# Patient Record
Sex: Male | Born: 1970 | Race: Black or African American | Hispanic: No | Marital: Single | Smoking: Current some day smoker
Health system: Southern US, Community
[De-identification: ages and names within clinical notes are randomized; demographics above are authoritative.]

## PROBLEM LIST (undated history)

## (undated) DIAGNOSIS — F32A Depression, unspecified: Secondary | ICD-10-CM

## (undated) DIAGNOSIS — I10 Essential (primary) hypertension: Secondary | ICD-10-CM

## (undated) DIAGNOSIS — F329 Major depressive disorder, single episode, unspecified: Secondary | ICD-10-CM

## (undated) DIAGNOSIS — N200 Calculus of kidney: Secondary | ICD-10-CM

## (undated) DIAGNOSIS — R51 Headache: Secondary | ICD-10-CM

## (undated) DIAGNOSIS — K219 Gastro-esophageal reflux disease without esophagitis: Secondary | ICD-10-CM

## (undated) DIAGNOSIS — K2990 Gastroduodenitis, unspecified, without bleeding: Secondary | ICD-10-CM

## (undated) DIAGNOSIS — R519 Headache, unspecified: Secondary | ICD-10-CM

## (undated) DIAGNOSIS — K5792 Diverticulitis of intestine, part unspecified, without perforation or abscess without bleeding: Secondary | ICD-10-CM

## (undated) DIAGNOSIS — K921 Melena: Secondary | ICD-10-CM

## (undated) DIAGNOSIS — F419 Anxiety disorder, unspecified: Secondary | ICD-10-CM

## (undated) HISTORY — DX: Diverticulitis of intestine, part unspecified, without perforation or abscess without bleeding: K57.92

## (undated) HISTORY — DX: Headache: R51

## (undated) HISTORY — DX: Anxiety disorder, unspecified: F41.9

## (undated) HISTORY — DX: Headache, unspecified: R51.9

## (undated) HISTORY — DX: Gastroduodenitis, unspecified, without bleeding: K29.90

## (undated) HISTORY — PX: TONSILLECTOMY: SUR1361

## (undated) HISTORY — DX: Essential (primary) hypertension: I10

---

## 1998-11-17 ENCOUNTER — Emergency Department (HOSPITAL_COMMUNITY): Admission: EM | Admit: 1998-11-17 | Discharge: 1998-11-17 | Payer: Self-pay | Admitting: Emergency Medicine

## 2001-04-22 ENCOUNTER — Emergency Department (HOSPITAL_COMMUNITY): Admission: EM | Admit: 2001-04-22 | Discharge: 2001-04-22 | Payer: Self-pay

## 2004-08-09 ENCOUNTER — Emergency Department (HOSPITAL_COMMUNITY): Admission: EM | Admit: 2004-08-09 | Discharge: 2004-08-09 | Payer: Self-pay | Admitting: Family Medicine

## 2005-02-28 ENCOUNTER — Emergency Department (HOSPITAL_COMMUNITY): Admission: EM | Admit: 2005-02-28 | Discharge: 2005-02-28 | Payer: Self-pay | Admitting: Emergency Medicine

## 2008-11-12 ENCOUNTER — Emergency Department (HOSPITAL_COMMUNITY): Admission: EM | Admit: 2008-11-12 | Discharge: 2008-11-12 | Payer: Self-pay | Admitting: Emergency Medicine

## 2009-03-12 ENCOUNTER — Emergency Department (HOSPITAL_COMMUNITY): Admission: EM | Admit: 2009-03-12 | Discharge: 2009-03-12 | Payer: Self-pay | Admitting: Emergency Medicine

## 2010-10-24 ENCOUNTER — Emergency Department (HOSPITAL_COMMUNITY)
Admission: EM | Admit: 2010-10-24 | Discharge: 2010-10-25 | Disposition: A | Discharge status: Home / Self Care | Payer: Self-pay | Attending: Emergency Medicine | Admitting: Emergency Medicine

## 2010-10-24 DIAGNOSIS — K5289 Other specified noninfective gastroenteritis and colitis: Secondary | ICD-10-CM | POA: Insufficient documentation

## 2010-10-24 DIAGNOSIS — R1033 Periumbilical pain: Secondary | ICD-10-CM | POA: Insufficient documentation

## 2010-10-24 LAB — COMPREHENSIVE METABOLIC PANEL
CO2: 29 mEq/L (ref 19–32)
Calcium: 10 mg/dL (ref 8.4–10.5)
Chloride: 104 mEq/L (ref 96–112)
GFR calc Af Amer: >60 mL/min (ref >60)
Glucose, Bld: 111 mg/dL — ABNORMAL HIGH (ref 70–99)
Potassium: 3.7 mEq/L (ref 3.5–5.1)
Sodium: 140 mEq/L (ref 135–145)
Total Bilirubin: 0.2 mg/dL — ABNORMAL LOW (ref 0.3–1.2)
Total Protein: 7.1 g/dL (ref 6.0–8.3)

## 2010-10-24 LAB — URINALYSIS, ROUTINE W REFLEX MICROSCOPIC
Glucose, UA: NEGATIVE mg/dL
Hgb urine dipstick: NEGATIVE
Ketones, ur: NEGATIVE mg/dL
Leukocytes, UA: NEGATIVE
Specific Gravity, Urine: 1.019 (ref 1.005–1.030)
Urobilinogen, UA: 0.2 mg/dL (ref 0.0–1.0)
pH: 7.5 (ref 5.0–8.0)

## 2010-10-24 LAB — DIFFERENTIAL
Eosinophils Relative: 11 % — ABNORMAL HIGH (ref 0–5)
Lymphocytes Relative: 34 % (ref 12–46)
Neutro Abs: 4.5 10*3/uL (ref 1.7–7.7)
Neutrophils Relative %: 48 % (ref 43–77)

## 2010-10-24 LAB — CBC
MCHC: 35.1 g/dL (ref 30.0–36.0)
RDW: 12.7 % (ref 11.5–15.5)

## 2010-10-25 ENCOUNTER — Emergency Department (HOSPITAL_COMMUNITY): Payer: Self-pay

## 2010-10-25 LAB — LIPASE, BLOOD: Lipase: 32 U/L (ref 11–59)

## 2010-10-25 MED ORDER — IOHEXOL 300 MG/ML  SOLN
100.0000 mL | Freq: Once | INTRAMUSCULAR | Status: AC | PRN
  Administered 2010-10-25: 100 mL via INTRAVENOUS

## 2012-06-21 ENCOUNTER — Emergency Department (HOSPITAL_COMMUNITY)
Admission: EM | Admit: 2012-06-21 | Discharge: 2012-06-21 | Disposition: A | Discharge status: Home / Self Care | Payer: Self-pay | Attending: Emergency Medicine | Admitting: Emergency Medicine

## 2012-06-21 ENCOUNTER — Emergency Department (HOSPITAL_COMMUNITY): Payer: Self-pay

## 2012-06-21 ENCOUNTER — Encounter (HOSPITAL_COMMUNITY): Payer: Self-pay | Admitting: Emergency Medicine

## 2012-06-21 DIAGNOSIS — Z79899 Other long term (current) drug therapy: Secondary | ICD-10-CM | POA: Insufficient documentation

## 2012-06-21 DIAGNOSIS — Y9367 Activity, basketball: Secondary | ICD-10-CM | POA: Insufficient documentation

## 2012-06-21 DIAGNOSIS — M25562 Pain in left knee: Secondary | ICD-10-CM

## 2012-06-21 DIAGNOSIS — F17200 Nicotine dependence, unspecified, uncomplicated: Secondary | ICD-10-CM | POA: Insufficient documentation

## 2012-06-21 DIAGNOSIS — S99929A Unspecified injury of unspecified foot, initial encounter: Secondary | ICD-10-CM | POA: Insufficient documentation

## 2012-06-21 DIAGNOSIS — Y9239 Other specified sports and athletic area as the place of occurrence of the external cause: Secondary | ICD-10-CM | POA: Insufficient documentation

## 2012-06-21 DIAGNOSIS — Y92838 Other recreation area as the place of occurrence of the external cause: Secondary | ICD-10-CM | POA: Insufficient documentation

## 2012-06-21 DIAGNOSIS — X58XXXA Exposure to other specified factors, initial encounter: Secondary | ICD-10-CM | POA: Insufficient documentation

## 2012-06-21 DIAGNOSIS — S8990XA Unspecified injury of unspecified lower leg, initial encounter: Secondary | ICD-10-CM | POA: Insufficient documentation

## 2012-06-21 MED ORDER — MELOXICAM 15 MG PO TABS: 15.0000 mg | ORAL_TABLET | Freq: Every day | ORAL | Status: DC

## 2012-06-21 NOTE — ED Provider Notes (Signed)
Medical screening examination/treatment/procedure(s) were performed by non-physician practitioner and as supervising physician I was immediately available for consultation/collaboration.   Makynzie Dobesh L Anaalicia Reimann, MD 06/21/12 1925 

## 2012-06-21 NOTE — ED Notes (Signed)
Pt was playing basketball yesterday. Had sudden left knee pain. Pain worsened over next several hours. Pt has exaggerated bone prominence on both knees but left slightly larger.

## 2012-06-21 NOTE — ED Provider Notes (Signed)
History     CSN: [id number]  Arrival date & time 06/21/12  1111   First MD Initiated Contact with Patient 06/21/12 1116      No chief complaint on file.   (Consider location/radiation/quality/duration/timing/severity/associated sxs/prior treatment) HPI  42 year old male presents to emergency department chief complaint of left knee pain.  Patient states he was playing basketball yesterday.  He states that this is the out of the ordinary for his regular physical activity.  Patient states he did not remember injuring the knee at any time.  Throughout the evening as he was grilling out he noticed increasing pain in the left anterior knee.  Patient states he has a history of knee problems and states he was told he had "soft bones."  Since he was a kid.  The patient noticed swelling to the anterior knee at the site of the tibial tuberosity.  He has difficulty with full extension and flexion of the knee.  Patient states he feels some instability in the knee when walking.  He denies any numbness or tingling of the feet.  Pain is worse with movement and walking. He has not tried any medications for relief.  History reviewed. No pertinent past medical history.  Past Surgical History  Procedure Laterality Date  . Tonsillectomy      No family history on file.  History  Substance Use Topics  . Smoking status: Current Every Day Smoker  . Smokeless tobacco: Not on file  . Alcohol Use: Yes      Review of Systems Ten systems reviewed and are negative for acute change, except as noted in the HPI.   Allergies  Review of patient's allergies indicates no known allergies.  Home Medications   Current Outpatient Rx  Name  Route  Sig  Dispense  Refill  . Aspirin-Acetaminophen-Caffeine (GOODY HEADACHE PO)   Oral   Take 1 packet by mouth once.           BP 130/62  Pulse 84  Temp(Src) 98 F (36.7 C) (Oral)  SpO2 97%  Physical Exam Physical Exam  Nursing note and vitals  reviewed. Constitutional: He appears well-developed and well-nourished. No distress.  HENT:  Head: Normocephalic and atraumatic.  Eyes: Conjunctivae normal are normal. No scleral icterus.  Neck: Normal range of motion. Neck supple.  Cardiovascular: Normal rate, regular rhythm and normal heart sounds.   Pulmonary/Chest: Effort normal and breath sounds normal. No respiratory distress.  Abdominal: Soft. There is no tenderness.  Musculoskeletal: He exhibits no edema.  Neurological: He is alert.  Skin: Skin is warm and dry. He is not diaphoretic.  Psychiatric: His behavior is normal.  Knee exam: the injured knee reveals soft tissue tenderness over medial joint line, mild diffuse swelling, reduced range of motion, tenderness over tibial tubercle. X-ray is negative for fracture.   ED Course  Procedures (including critical care time)  Labs Reviewed - No data to display Dg Knee Complete 4 Views Left  06/21/2012  *RADIOLOGY REPORT*  Clinical Data: Basketball injury.  Knee pain.  LEFT KNEE - COMPLETE 4+ VIEW  Comparison: None.  Findings: There is no acute bony or joint abnormality.  No joint effusion is identified.  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  IMPRESSION: No acute finding.   Original Report Authenticated By: Holley Dexter, M.D.      1. Knee pain, acute, left       MDM  12:19 PM Filed Vitals:   06/21/12 1120  BP: 130/62  Pulse: 84  Temp: 98 F (  36.7 C)   It is very tender to palpation over the tibial tubercle.  He has some swelling in the knee.  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  And discharging the patient with NSAIDs, knee brace and crutches.  The patient should follow up with an orthopedic doctor.        Arthor Captain, PA-C 06/21/12 1913

## 2013-03-31 ENCOUNTER — Emergency Department (HOSPITAL_COMMUNITY)
Admission: EM | Admit: 2013-03-31 | Discharge: 2013-04-01 | Disposition: A | Discharge status: Home / Self Care | Payer: Self-pay | Attending: Emergency Medicine | Admitting: Emergency Medicine

## 2013-03-31 ENCOUNTER — Encounter (HOSPITAL_COMMUNITY): Payer: Self-pay | Admitting: Emergency Medicine

## 2013-03-31 DIAGNOSIS — H5710 Ocular pain, unspecified eye: Secondary | ICD-10-CM | POA: Insufficient documentation

## 2013-03-31 DIAGNOSIS — R51 Headache: Secondary | ICD-10-CM | POA: Insufficient documentation

## 2013-03-31 DIAGNOSIS — R42 Dizziness and giddiness: Secondary | ICD-10-CM | POA: Insufficient documentation

## 2013-03-31 DIAGNOSIS — F17200 Nicotine dependence, unspecified, uncomplicated: Secondary | ICD-10-CM | POA: Insufficient documentation

## 2013-03-31 DIAGNOSIS — R519 Headache, unspecified: Secondary | ICD-10-CM

## 2013-03-31 NOTE — ED Notes (Signed)
Pt reports he has been "getting over a cold" and has had a HA above his L eyebrow for the past 5 days, reports it is worse than any HA he has had before, Pt states he has taken Tylenol and BC powders with relief, "but it keeps coming back" Pt a&o x4, ambulatory to triage.

## 2013-04-01 ENCOUNTER — Emergency Department (HOSPITAL_COMMUNITY): Payer: Self-pay

## 2013-04-01 LAB — CBC WITH DIFFERENTIAL/PLATELET
BASOS ABS: 0 10*3/uL (ref 0.0–0.1)
BASOS PCT: 0 % (ref 0–1)
EOS ABS: 0.2 10*3/uL (ref 0.0–0.7)
Eosinophils Relative: 2 % (ref 0–5)
HEMATOCRIT: 41.9 % (ref 39.0–52.0)
HEMOGLOBIN: 14.5 g/dL (ref 13.0–17.0)
Lymphocytes Relative: 39 % (ref 12–46)
Lymphs Abs: 3.8 10*3/uL (ref 0.7–4.0)
MCH: 28.9 pg (ref 26.0–34.0)
MCHC: 34.6 g/dL (ref 30.0–36.0)
MCV: 83.5 fL (ref 78.0–100.0)
MONOS PCT: 8 % (ref 3–12)
Monocytes Absolute: 0.8 10*3/uL (ref 0.1–1.0)
Neutro Abs: 5 10*3/uL (ref 1.7–7.7)
Neutrophils Relative %: 51 % (ref 43–77)
PLATELETS: 276 10*3/uL (ref 150–400)
RBC: 5.02 MIL/uL (ref 4.22–5.81)
RDW: 13.3 % (ref 11.5–15.5)
WBC: 9.8 10*3/uL (ref 4.0–10.5)

## 2013-04-01 LAB — POCT I-STAT, CHEM 8
BUN: 15 mg/dL (ref 6–23)
CHLORIDE: 105 meq/L (ref 96–112)
CREATININE: 1.4 mg/dL — AB (ref 0.50–1.35)
Calcium, Ion: 1.21 mmol/L (ref 1.12–1.23)
GLUCOSE: 88 mg/dL (ref 70–99)
HCT: 46 % (ref 39.0–52.0)
Hemoglobin: 15.6 g/dL (ref 13.0–17.0)
POTASSIUM: 3.7 meq/L (ref 3.7–5.3)
SODIUM: 142 meq/L (ref 137–147)
TCO2: 26 mmol/L (ref 0–100)

## 2013-04-01 LAB — PROTIME-INR
INR: 0.82 (ref 0.00–1.49)
PROTHROMBIN TIME: 11.2 s — AB (ref 11.6–15.2)

## 2013-04-01 MED ORDER — HYDROMORPHONE HCL PF 1 MG/ML IJ SOLN
1.0000 mg | Freq: Once | INTRAMUSCULAR | Status: AC
  Administered 2013-04-01: 1 mg via INTRAVENOUS
  Filled 2013-04-01: qty 1

## 2013-04-01 MED ORDER — ONDANSETRON 4 MG PO TBDP: 4.0000 mg | ORAL_TABLET | Freq: Three times a day (TID) | ORAL | Status: DC | PRN

## 2013-04-01 MED ORDER — IBUPROFEN 600 MG PO TABS: 600.0000 mg | ORAL_TABLET | Freq: Four times a day (QID) | ORAL | Status: DC | PRN

## 2013-04-01 MED ORDER — ONDANSETRON HCL 4 MG/2ML IJ SOLN
4.0000 mg | Freq: Once | INTRAMUSCULAR | Status: AC
  Administered 2013-04-01: 4 mg via INTRAVENOUS
  Filled 2013-04-01: qty 2

## 2013-04-01 NOTE — ED Provider Notes (Signed)
CSN: [id number]     Arrival date & time 03/31/13  2212 History   First MD Initiated Contact with Patient 04/01/13 0211     Chief Complaint  Patient presents with  . Headache   (Consider location/radiation/quality/duration/timing/severity/associated sxs/prior Treatment) HPI Comments: Patient states, 5 days ago.  During intercourse, he developed a sharp, stabbing, sudden.  Pain over his or her left eyebrow.  That has been persistent, since, with vague visual changes.  He tried taking Tylenol intermittently for the past 5 days with minimal relief of his pain.  Reports nausea, but no vomiting  Patient is a 43 y.o. male presenting with headaches. The history is provided by the patient.  Headache Pain location:  Frontal Quality:  Sharp Radiates to:  Does not radiate Severity currently:  7/10 Severity at highest:  10/10 Onset quality:  Sudden Duration:  5 days Timing:  Constant Progression:  Unchanged Chronicity:  New Similar to prior headaches: no   Context: intercourse   Relieved by:  Nothing Worsened by:  Activity Ineffective treatments:  Acetaminophen Associated symptoms: dizziness and eye pain   Associated symptoms: no fever, no neck pain, no numbness, no paresthesias, no photophobia and no sinus pressure     History reviewed. No pertinent past medical history. Past Surgical History  Procedure Laterality Date  . Tonsillectomy     History reviewed. No pertinent family history. History  Substance Use Topics  . Smoking status: Current Every Day Smoker  . Smokeless tobacco: Not on file  . Alcohol Use: Yes    Review of Systems  Constitutional: Negative for fever.  HENT: Negative for facial swelling and sinus pressure.   Eyes: Positive for pain. Negative for photophobia.  Musculoskeletal: Negative for neck pain.  Skin: Negative for rash.  Neurological: Positive for dizziness and headaches. Negative for weakness, numbness and paresthesias.    Allergies  Review of  patient's allergies indicates no known allergies.  Home Medications   Current Outpatient Rx  Name  Route  Sig  Dispense  Refill  . ibuprofen (ADVIL,MOTRIN) 600 MG tablet   Oral   Take 1 tablet (600 mg total) by mouth every 6 (six) hours as needed.   30 tablet   0   . ondansetron (ZOFRAN ODT) 4 MG disintegrating tablet   Oral   Take 1 tablet (4 mg total) by mouth every 8 (eight) hours as needed for nausea or vomiting.   20 tablet   0    BP 123/74  Pulse 72  Temp(Src) 98.4 F (36.9 C) (Oral)  Resp 18  Ht 5\' 10"  (1.778 m)  Wt 217 lb (98.431 kg)  BMI 31.14 kg/m2  SpO2 98% Physical Exam  Nursing note and vitals reviewed. Constitutional: He is oriented to person, place, and time. He appears well-developed and well-nourished.  HENT:  Head: Normocephalic.  Left Ear: External ear normal.  Nose: Right sinus exhibits no maxillary sinus tenderness and no frontal sinus tenderness. Left sinus exhibits no maxillary sinus tenderness and no frontal sinus tenderness.  Mouth/Throat: Oropharynx is clear and moist.  Eyes: EOM are normal. Pupils are equal, round, and reactive to light. Right conjunctiva is not injected. Right conjunctiva has no hemorrhage. Left conjunctiva is not injected. Left conjunctiva has no hemorrhage.  Neck: Normal range of motion.  Cardiovascular: Normal rate and regular rhythm.   Pulmonary/Chest: Effort normal.  Musculoskeletal: Normal range of motion.  Neurological: He is alert and oriented to person, place, and time. No cranial nerve deficit.  Skin: Skin is  warm. No rash noted. No erythema.    ED Course  Procedures (including critical care time) Labs Review Labs Reviewed  PROTIME-INR - Abnormal; Notable for the following:    Prothrombin Time 11.2 (*)    All other components within normal limits  POCT I-STAT, CHEM 8 - Abnormal; Notable for the following:    Creatinine, Ser 1.40 (*)    All other components within normal limits  CBC WITH DIFFERENTIAL    Imaging Review Ct Head Wo Contrast  04/01/2013   CLINICAL DATA:  Left-sided headache  EXAM: CT HEAD WITHOUT CONTRAST  TECHNIQUE: Contiguous axial images were obtained from the base of the skull through the vertex without contrast.  COMPARISON:  None  FINDINGS: Normal appearance of the intracranial structures. No evidence for acute hemorrhage, mass lesion, midline shift, hydrocephalus or large infarct. No acute bony abnormality. The visualized sinuses are clear.  IMPRESSION: No acute intracranial abnormality.   Electronically Signed   By: Daryll Brod M.D.   On: 04/01/2013 02:47    EKG Interpretation   None       MDM   1. Headache     CT scan reviewed.  There is no acute bleed.  Patient was reevaluated after receiving medication, Zofran, and Dilaudid, he no longer has a headache    Garald Balding, NP 04/01/13 0343  Garald Balding, NP 04/01/13 [phone]

## 2013-04-01 NOTE — Discharge Instructions (Signed)
Today your CT Scan is normal Your headache resolved after IV medication and antiemetic  You have been give a prescription for pain control and nausea

## 2013-04-01 NOTE — ED Provider Notes (Signed)
Medical screening examination/treatment/procedure(s) were performed by non-physician practitioner and as supervising physician I was immediately available for consultation/collaboration.  EKG Interpretation   None         Osvaldo Shipper, MD 04/01/13 [phone]

## 2013-04-13 ENCOUNTER — Emergency Department (HOSPITAL_COMMUNITY)
Admission: EM | Admit: 2013-04-13 | Discharge: 2013-04-13 | Disposition: A | Discharge status: Home / Self Care | Payer: Self-pay

## 2013-05-05 ENCOUNTER — Encounter (HOSPITAL_COMMUNITY): Payer: Self-pay | Admitting: Emergency Medicine

## 2013-05-05 ENCOUNTER — Encounter: Payer: Self-pay | Admitting: Gastroenterology

## 2013-05-05 ENCOUNTER — Emergency Department (HOSPITAL_COMMUNITY): Payer: 59

## 2013-05-05 ENCOUNTER — Emergency Department (HOSPITAL_COMMUNITY)
Admission: EM | Admit: 2013-05-05 | Discharge: 2013-05-05 | Disposition: A | Discharge status: Home / Self Care | Payer: 59 | Attending: Emergency Medicine | Admitting: Emergency Medicine

## 2013-05-05 DIAGNOSIS — R109 Unspecified abdominal pain: Secondary | ICD-10-CM | POA: Insufficient documentation

## 2013-05-05 DIAGNOSIS — K922 Gastrointestinal hemorrhage, unspecified: Secondary | ICD-10-CM | POA: Insufficient documentation

## 2013-05-05 DIAGNOSIS — F17200 Nicotine dependence, unspecified, uncomplicated: Secondary | ICD-10-CM | POA: Insufficient documentation

## 2013-05-05 LAB — COMPREHENSIVE METABOLIC PANEL
ALK PHOS: 69 U/L (ref 39–117)
ALT: 19 U/L (ref 0–53)
AST: 22 U/L (ref 0–37)
Albumin: 3.5 g/dL (ref 3.5–5.2)
BUN: 20 mg/dL (ref 6–23)
CHLORIDE: 101 meq/L (ref 96–112)
CO2: 24 mEq/L (ref 19–32)
Calcium: 9.2 mg/dL (ref 8.4–10.5)
Creatinine, Ser: 1.31 mg/dL (ref 0.50–1.35)
GFR calc Af Amer: 77 mL/min — ABNORMAL LOW (ref >90)
GFR, EST NON AFRICAN AMERICAN: 66 mL/min — AB (ref >90)
Glucose, Bld: 134 mg/dL — ABNORMAL HIGH (ref 70–99)
POTASSIUM: 4 meq/L (ref 3.7–5.3)
Sodium: 138 mEq/L (ref 137–147)
TOTAL PROTEIN: 6.9 g/dL (ref 6.0–8.3)
Total Bilirubin: 0.3 mg/dL (ref 0.3–1.2)

## 2013-05-05 LAB — CBC
HEMATOCRIT: 37.3 % — AB (ref 39.0–52.0)
HEMOGLOBIN: 12.8 g/dL — AB (ref 13.0–17.0)
MCH: 29.2 pg (ref 26.0–34.0)
MCHC: 34.3 g/dL (ref 30.0–36.0)
MCV: 85 fL (ref 78.0–100.0)
PLATELETS: 246 10*3/uL (ref 150–400)
RBC: 4.39 MIL/uL (ref 4.22–5.81)
RDW: 14 % (ref 11.5–15.5)
WBC: 10.1 10*3/uL (ref 4.0–10.5)

## 2013-05-05 LAB — POC OCCULT BLOOD, ED: FECAL OCCULT BLD: POSITIVE — AB

## 2013-05-05 MED ORDER — TRAMADOL HCL 50 MG PO TABS: 50.0000 mg | ORAL_TABLET | Freq: Four times a day (QID) | ORAL | Status: DC | PRN

## 2013-05-05 MED ORDER — SODIUM CHLORIDE 0.9 % IV BOLUS (SEPSIS)
1000.0000 mL | Freq: Once | INTRAVENOUS | Status: AC
  Administered 2013-05-05: 1000 mL via INTRAVENOUS

## 2013-05-05 MED ORDER — MORPHINE SULFATE 4 MG/ML IJ SOLN
4.0000 mg | Freq: Once | INTRAMUSCULAR | Status: AC
  Administered 2013-05-05: 4 mg via INTRAVENOUS
  Filled 2013-05-05: qty 1

## 2013-05-05 MED ORDER — ONDANSETRON HCL 4 MG/2ML IJ SOLN
4.0000 mg | Freq: Once | INTRAMUSCULAR | Status: AC
  Administered 2013-05-05: 4 mg via INTRAVENOUS
  Filled 2013-05-05: qty 2

## 2013-05-05 MED ORDER — PANTOPRAZOLE SODIUM 40 MG PO TBEC
40.0000 mg | DELAYED_RELEASE_TABLET | Freq: Once | ORAL | Status: AC
  Administered 2013-05-05: 40 mg via ORAL
  Filled 2013-05-05: qty 1

## 2013-05-05 MED ORDER — IOHEXOL 300 MG/ML  SOLN
100.0000 mL | Freq: Once | INTRAMUSCULAR | Status: AC | PRN
  Administered 2013-05-05: 100 mL via INTRAVENOUS

## 2013-05-05 MED ORDER — PANTOPRAZOLE SODIUM 40 MG PO TBEC: 40.0000 mg | DELAYED_RELEASE_TABLET | Freq: Every day | ORAL | Status: DC

## 2013-05-05 MED ORDER — IOHEXOL 300 MG/ML  SOLN
20.0000 mL | Freq: Once | INTRAMUSCULAR | Status: AC | PRN
  Administered 2013-05-05: 20 mL via ORAL

## 2013-05-05 NOTE — ED Notes (Signed)
The pt is c/o rectal bleeding he noticed just noticed today.  The bleeding was dark he denies having hemorrhoids.  No pain in his rectum.  No previous history

## 2013-05-05 NOTE — ED Notes (Signed)
Patient was asked to stay 20 more minutes due to medication administration- patient refused.

## 2013-05-05 NOTE — ED Notes (Signed)
This RN walked in with discharge paper work and patient stated that his pain was 10/10. Dr. Lita Mains made aware and new orders placed.

## 2013-05-05 NOTE — Discharge Instructions (Signed)
Call and make an appointment to followup with gastroenterologist this week. Avoid all NSAIDs such as ibuprofen and naproxen. Return immediately to the emergency department for worsening bleeding, lightheadedness, increased abdominal pain or any concerns.  Bloody Stools Bloody stools often mean that there is a problem in the digestive tract. Your caregiver may use the term "melena" to describe black, tarry, and bad smelling stools or "hematochezia" to describe red or maroon-colored stools. Blood seen in the stool can be caused by bleeding anywhere along the intestinal tract.  A black stool usually means that blood is coming from the upper part of the gastrointestinal tract (esophagus, stomach, or small bowel). Passing maroon-colored stools or bright red blood usually means that blood is coming from lower down in the large bowel or the rectum. However, sometimes massive bleeding in the stomach or small intestine can cause bright red bloody stools.  Consuming black licorice, lead, iron pills, medicines containing bismuth subsalicylate, or blueberries can also cause black stools. Your caregiver can test black stools to see if blood is present. It is important that the cause of the bleeding be found. Treatment can then be started, and the problem can be corrected. Rectal bleeding may not be serious, but you should not assume everything is okay until you know the cause.It is very important to follow up with your caregiver or a specialist in gastrointestinal problems. CAUSES  Blood in the stools can come from various underlying causes.Often, the cause is not found during your first visit. Testing is often needed to discover the cause of bleeding in the gastrointestinal tract. Causes range from simple to serious or even life-threatening.Possible causes include:  Hemorrhoids.These are veins that are full of blood (engorged) in the rectum. They cause pain, inflammation, and may bleed.  Anal fissures.These are  areas of painful tearing which may bleed. They are often caused by passing hard stool.  Diverticulosis.These are pouches that form on the colon over time, with age, and may bleed significantly.  Diverticulitis.This is inflammation in areas with diverticulosis. It can cause pain, fever, and bloody stools, although bleeding is rare.  Proctitis and colitis. These are inflamed areas of the rectum or colon. They may cause pain, fever, and bloody stools.  Polyps and cancer. Colon cancer is a leading cause of preventable cancer death.It often starts out as precancerous polyps that can be removed during a colonoscopy, preventing progression into cancer. Sometimes, polyps and cancer may cause rectal bleeding.  Gastritis and ulcers.Bleeding from the upper gastrointestinal tract (near the stomach) may travel through the intestines and produce black, sometimes tarry, often bad smelling stools. In certain cases, if the bleeding is fast enough, the stools may not be black, but red and the condition may be life-threatening. SYMPTOMS  You may have stools that are bright red and bloody, that are normal color with blood on them, or that are dark black and tarry. In some cases, you may only have blood in the toilet bowl. Any of these cases need medical care. You may also have:  Pain at the anus or anywhere in the rectum.  Lightheadedness or feeling faint.  Extreme weakness.  Nausea or vomiting.  Fever. DIAGNOSIS Your caregiver may use the following methods to find the cause of your bleeding:  Taking a medical history. Age is important. Older people tend to develop polyps and cancer more often. If there is anal pain and a hard, large stool associated with bleeding, a tear of the anus may be the cause. If blood  drips into the toilet after a bowel movement, bleeding hemorrhoids may be the problem. The color and frequency of the bleeding are additional considerations. In most cases, the medical history  provides clues, but seldom the final answer.  A visual and finger (digital) exam. Your caregiver will inspect the anal area, looking for tears and hemorrhoids. A finger exam can provide information when there is tenderness or a growth inside. In men, the prostate is also examined.  Endoscopy. Several types of small, long scopes (endoscopes) are used to view the colon.  In the office, your caregiver may use a rigid, or more commonly, a flexible viewing sigmoidoscope. This exam is called flexible sigmoidoscopy. It is performed in 5 to 10 minutes.  A more thorough exam is accomplished with a colonoscope. It allows your caregiver to view the entire 5 to 6 foot long colon. Medicine to help you relax (sedative) is usually given for this exam. Frequently, a bleeding lesion may be present beyond the reach of the sigmoidoscope. So, a colonoscopy may be the best exam to start with. Both exams are usually done on an outpatient basis. This means the patient does not stay overnight in the hospital or surgery center.  An upper endoscopy may be needed to examine your stomach. Sedation is used and a flexible endoscope is put in your mouth, down to your stomach.  A barium enema X-ray. This is an X-ray exam. It uses liquid barium inserted by enema into the rectum. This test alone may not identify an actual bleeding point. X-rays highlight abnormal shadows, such as those made by lumps (tumors), diverticuli, or colitis. TREATMENT  Treatment depends on the cause of your bleeding.   For bleeding from the stomach or colon, the caregiver doing your endoscopy or colonoscopy may be able to stop the bleeding as part of the procedure.  Inflammation or infection of the colon can be treated with medicines.  Many rectal problems can be treated with creams, suppositories, or warm baths.  Surgery is sometimes needed.  Blood transfusions are sometimes needed if you have lost a lot of blood.  For any bleeding problem, let  your caregiver know if you take aspirin or other blood thinners regularly. HOME CARE INSTRUCTIONS   Take any medicines exactly as prescribed.  Keep your stools soft by eating a diet high in fiber. Prunes (1 to 3 a day) work well for many people.  Drink enough water and fluids to keep your urine clear or pale yellow.  Take sitz baths if advised. A sitz bath is when you sit in a bathtub with warm water for 10 to 15 minutes to soak, soothe, and cleanse the rectal area.  If enemas or suppositories are advised, be sure you know how to use them. Tell your caregiver if you have problems with this.  Monitor your bowel movements to look for signs of improvement or worsening. SEEK MEDICAL CARE IF:   You do not improve in the time expected.  Your condition worsens after initial improvement.  You develop any new symptoms. SEEK IMMEDIATE MEDICAL CARE IF:   You develop severe or prolonged rectal bleeding.  You vomit blood.  You feel weak or faint.  You have a fever. MAKE SURE YOU:  Understand these instructions.  Will watch your condition.  Will get help right away if you are not doing well or get worse. Document Released: 02/10/2002 Document Revised: 05/15/2011 Document Reviewed: 07/08/2010 [facility] Patient Information 2014 [place], [place]. Abdominal Pain, Adult Many things can  cause abdominal pain. Usually, abdominal pain is not caused by a disease and will improve without treatment. It can often be observed and treated at home. Your health care provider will do a physical exam and possibly order blood tests and X-rays to help determine the seriousness of your pain. However, in many cases, more time must pass before a clear cause of the pain can be found. Before that point, your health care provider may not know if you need more testing or further treatment. HOME CARE INSTRUCTIONS  Monitor your abdominal pain for any changes. The following actions may help to alleviate any discomfort  you are experiencing:  Only take over-the-counter or prescription medicines as directed by your health care provider.  Do not take laxatives unless directed to do so by your health care provider.  Try a clear liquid diet (broth, tea, or water) as directed by your health care provider. Slowly move to a bland diet as tolerated. SEEK MEDICAL CARE IF:  You have unexplained abdominal pain.  You have abdominal pain associated with nausea or diarrhea.  You have pain when you urinate or have a bowel movement.  You experience abdominal pain that wakes you in the night.  You have abdominal pain that is worsened or improved by eating food.  You have abdominal pain that is worsened with eating fatty foods. SEEK IMMEDIATE MEDICAL CARE IF:   Your pain does not go away within 2 hours.  You have a fever.  You keep throwing up (vomiting).  Your pain is felt only in portions of the abdomen, such as the right side or the left lower portion of the abdomen.  You pass bloody or black tarry stools. MAKE SURE YOU:  Understand these instructions.   Will watch your condition.   Will get help right away if you are not doing well or get worse.  Document Released: 11/30/2004 Document Revised: 12/11/2012 Document Reviewed: 10/30/2012 [facility] Patient Information 2014 [place].

## 2013-05-05 NOTE — ED Provider Notes (Signed)
CSN: [id number]     Arrival date & time 05/05/13  0000 History   First MD Initiated Contact with Patient 05/05/13 0140     Chief Complaint  Patient presents with  . Rectal Bleeding     (Consider location/radiation/quality/duration/timing/severity/associated sxs/prior Treatment) HPI Patient presents with lower bowel pain x1 day and 3 episodes of passage of dark red blood per rectum. Patient denies any fevers or chills. Patient denies previously similar symptoms. He denies any recent travel out of the country or sick contacts. Patient denies any nausea or vomiting. He had a loose bowel movement earlier today. Patient denies any chest pain or shortness of breath. He has experienced lightheadedness especially with standing or sitting up. He said no focal weakness or numbness. History reviewed. No pertinent past medical history. Past Surgical History  Procedure Laterality Date  . Tonsillectomy     No family history on file. History  Substance Use Topics  . Smoking status: Current Every Day Smoker  . Smokeless tobacco: Not on file  . Alcohol Use: Yes    Review of Systems  Constitutional: Negative for fever and chills.  Respiratory: Negative for shortness of breath.   Cardiovascular: Negative for chest pain and palpitations.  Gastrointestinal: Positive for abdominal pain, diarrhea, blood in stool, anal bleeding and rectal pain. Negative for nausea, vomiting and constipation.  Musculoskeletal: Negative for back pain, myalgias, neck pain and neck stiffness.  Skin: Negative for rash and wound.  Neurological: Positive for dizziness and light-headedness. Negative for weakness, numbness and headaches.  All other systems reviewed and are negative.      Allergies  Review of patient's allergies indicates no known allergies.  Home Medications   Current Outpatient Rx  Name  Route  Sig  Dispense  Refill  . Aspirin-Caffeine (BC FAST PAIN RELIEF PO)   Oral   Take 1 packet by mouth daily as  needed (for pain).          BP 101/84  Pulse 87  Temp(Src) 97.8 F (36.6 C) (Oral)  Resp 12  SpO2 100% Physical Exam  Nursing note and vitals reviewed. Constitutional: He is oriented to person, place, and time. He appears well-developed and well-nourished. No distress.  HENT:  Head: Normocephalic and atraumatic.  Mouth/Throat: Oropharynx is clear and moist.  Eyes: EOM are normal. Pupils are equal, round, and reactive to light.  Neck: Normal range of motion. Neck supple.  Cardiovascular: Normal rate and regular rhythm.   Pulmonary/Chest: Effort normal and breath sounds normal. No respiratory distress. He has no wheezes. He has no rales. He exhibits no tenderness.  Abdominal: Soft. Bowel sounds are normal. He exhibits no distension and no mass. There is tenderness (bilateral lower abdomen tenderness to palpation without rebound or guarding.). There is no rebound and no guarding.  Genitourinary: Guaiac positive stool.  Rectal tenderness without obvious enlarged hemorrhoids. Gross blood per rectum  Musculoskeletal: Normal range of motion. He exhibits no edema and no tenderness.  Neurological: He is alert and oriented to person, place, and time.  Patient is alert and oriented x3 with clear, goal oriented speech. Patient has 5/5 motor in all extremities. Sensation is intact to light touch. Patient has a normal gait and walks without assistance.   Skin: Skin is warm and dry. No rash noted. No erythema.  Psychiatric: He has a normal mood and affect. His behavior is normal.    ED Course  Procedures (including critical care time) Labs Review Labs Reviewed  CBC - Abnormal; Notable for  the following:    Hemoglobin 12.8 (*)    HCT 37.3 (*)    All other components within normal limits  COMPREHENSIVE METABOLIC PANEL - Abnormal; Notable for the following:    Glucose, Bld 134 (*)    GFR calc non Af Amer 66 (*)    GFR calc Af Amer 77 (*)    All other components within normal limits  POC  OCCULT BLOOD, ED - Abnormal; Notable for the following:    Fecal Occult Bld POSITIVE (*)    All other components within normal limits   Imaging Review Ct Abdomen Pelvis W Contrast  05/05/2013   CLINICAL DATA:  GI bleed and lower abdominal pain.  EXAM: CT ABDOMEN AND PELVIS WITH CONTRAST  TECHNIQUE: Multidetector CT imaging of the abdomen and pelvis was performed using the standard protocol following bolus administration of intravenous contrast.  CONTRAST:  157mL OMNIPAQUE IOHEXOL 300 MG/ML  SOLN  COMPARISON:  None.  FINDINGS: BODY WALL: Unremarkable.  LOWER CHEST: Unremarkable.  ABDOMEN/PELVIS:  Liver: No focal abnormality.  Biliary: No evidence of biliary obstruction or stone.  Pancreas: Unremarkable.  Spleen: Unremarkable.  Adrenals: Unremarkable.  Kidneys and ureters: No hydronephrosis or stone. Hypo enhancement of the lower pole right kidney is associated with cortical thinning, consistent with chronic scarring. This is new from 2012.  Bladder: Unremarkable.  Reproductive: Unremarkable.  Bowel: No obstruction. Normal appendix.  Retroperitoneum: No mass or adenopathy.  Peritoneum: No free fluid or gas.  Vascular: Early aortoiliac atherosclerosis.  OSSEOUS: No acute abnormalities.  IMPRESSION: No acute intra-abdominal abnormality.   Electronically Signed   By: Jorje Guild M.D.   On: 05/05/2013 03:59     EKG Interpretation None      MDM   Final diagnoses:  Gastrointestinal bleed  Abdominal pain   Patient has had no further bleeding in the emergency department. His dizziness is improved with IV fluids. His abdomen is soft. I discussed with gastroenterology and they agree with plan to have him followup as an outpatient. I've given him strict return precautions and is voiced understanding he understands he needs to return for worsening bleeding, worsening pain, fever, lightheadedness or any concerns.     Julianne Rice, MD 05/05/13 [phone]

## 2013-05-05 NOTE — ED Notes (Signed)
Patient was found to be dressed and leaving the ED. This RN stopped patient due to the recent administration of IV morphine. Patient still had IV in place and had not been given his discharge paperwork. This RN asked patient to return to his room where I instructed him that it was not safe to leave so soon after receiving medication. Patient yelled, "Im not staying, take this IV out." This RN removed IV and patient signed discharge paperwork. Dr. Lita Mains made aware.

## 2013-05-06 NOTE — Progress Notes (Signed)
ED CM received a voice message from pt CM attempted to return the call but received no answer with automated message stating pt mailbox is full

## 2013-05-06 NOTE — Progress Notes (Signed)
   CARE MANAGEMENT ED NOTE 05/06/2013  Patient:  Leslie Mcmillan, Leslie Mcmillan   Account Number:  [id number]  Date Initiated:  05/06/2013  Documentation initiated by:  Jackelyn Poling  Subjective/Objective Assessment:   43 yr old [employer] male left [facility] AMA on 05/05/13 0542 c/o rectal bleeding without hemorrhoids  Left CM a voice message 05/05/13 at 48 Requesting assist from [facility] program Request return call to [phone]     Subjective/Objective Assessment Detail:   avs indicates tramadol and protonix rx(s) with f/u to [facility] recommended  This pt is not a candidate for [facility] (pt has insurance coverage verified, No assist for ultram, controlled substance and protonix lowest self pay cost $10-12 [employer], [employer])  Penidng return call from pt     Action/Plan:   CM Reviewed EPIC indicating pt with [place] health care insurance coverage confirmed via e verification by Registration Cm called [phone] x [phone] Automated system states "mailbox is full"  message can not be left, no answer   Action/Plan Detail:   Cm noted emergency contact [place] dialed [phone], no answer Cm left a voice message requesting   Anticipated DC Date:  05/05/2013     Status Recommendation to Physician:   Result of Recommendation:    Other ED [place]  Other  PCP issues  Medication Assistance  Outpatient Services - Pt will follow up    Choice offered to / List presented to:            Status of service:  Completed, signed off  ED Comments:   ED Comments Detail:

## 2013-05-08 ENCOUNTER — Ambulatory Visit (INDEPENDENT_AMBULATORY_CARE_PROVIDER_SITE_OTHER): Payer: 59 | Admitting: Gastroenterology

## 2013-05-08 ENCOUNTER — Other Ambulatory Visit (INDEPENDENT_AMBULATORY_CARE_PROVIDER_SITE_OTHER): Payer: 59

## 2013-05-08 ENCOUNTER — Encounter: Payer: Self-pay | Admitting: Gastroenterology

## 2013-05-08 VITALS — BP 118/60 | HR 100 | Ht 68.0 in | Wt 217.1 lb

## 2013-05-08 DIAGNOSIS — R1031 Right lower quadrant pain: Secondary | ICD-10-CM

## 2013-05-08 DIAGNOSIS — K625 Hemorrhage of anus and rectum: Secondary | ICD-10-CM

## 2013-05-08 LAB — CBC WITH DIFFERENTIAL/PLATELET
BASOS ABS: 0 10*3/uL (ref 0.0–0.1)
Basophils Relative: 0.5 % (ref 0.0–3.0)
Eosinophils Absolute: 0.2 10*3/uL (ref 0.0–0.7)
Eosinophils Relative: 2.6 % (ref 0.0–5.0)
HCT: 34.1 % — ABNORMAL LOW (ref 39.0–52.0)
Hemoglobin: 11.5 g/dL — ABNORMAL LOW (ref 13.0–17.0)
LYMPHS PCT: 30.6 % (ref 12.0–46.0)
Lymphs Abs: 2.8 10*3/uL (ref 0.7–4.0)
MCHC: 33.6 g/dL (ref 30.0–36.0)
MCV: 86.2 fl (ref 78.0–100.0)
MONOS PCT: 9.2 % (ref 3.0–12.0)
Monocytes Absolute: 0.9 10*3/uL (ref 0.1–1.0)
Neutro Abs: 5.3 10*3/uL (ref 1.4–7.7)
Neutrophils Relative %: 57.1 % (ref 43.0–77.0)
PLATELETS: 255 10*3/uL (ref 150.0–400.0)
RBC: 3.96 Mil/uL — ABNORMAL LOW (ref 4.22–5.81)
RDW: 14.7 % — AB (ref 11.5–14.6)
WBC: 9.3 10*3/uL (ref 4.5–10.5)

## 2013-05-08 MED ORDER — MOVIPREP 100 G PO SOLR: 1.0000 | Freq: Once | ORAL | Status: DC

## 2013-05-08 NOTE — Patient Instructions (Addendum)
You have been given a separate informational sheet regarding your tobacco use, the importance of quitting and local resources to help you quit.  Your physician has requested that you go to the basement for the following lab work before leaving today: [place] have been scheduled for a colonoscopy at [facility] with Dr. Henrene Pastor, please follow written instructions given today.  Please call by 5 pm today to cancel.

## 2013-05-08 NOTE — Progress Notes (Signed)
05/08/2013 Leslie Mcmillan [id number] 1970-07-28   HISTORY OF PRESENT ILLNESS:  Patient is a pleasant 43 year old male who presents to our office today for follow-up after an ER visit on 3/1.  Referred by ER physician, Dr. Lita Mains.  He states that on Sunday, March 1, he had sudden onset of rectal bleeding. The blood is described as dark in color/maroon. He went to the emergency department because he was having some dizziness at that time as well. A rectal exam was performed and he was found to be heme positive with gross blood on exam and large hemorrhoids as well. CBC showed hemoglobin of 12.8 g, which was down from 15.6 grams just one month ago. CT scan of the abdomen and pelvis with contrast was performed and was normal.  His dizziness improved with IV fluids and he was told to followup with gastroenterology for colonoscopy. He states that the bleeding has continued and is occurring with bowel movements. His last bowel movement was last evening with none yet so far today. When the bleeding first started he was having some blood in his underwear as well. He complains of some lower abdominal pain particularly on the right side. There is some nausea. No previous episode similar symptoms. He denies any remarkable family history related to the gastrointestinal tract.   Past Medical History  Diagnosis Date  . Anxiety   . Hypertension    Past Surgical History  Procedure Laterality Date  . Tonsillectomy      reports that he has been smoking.  He has never used smokeless tobacco. He reports that he drinks alcohol. He reports that he does not use illicit drugs. family history includes Anuerysm in his mother; Diabetes in his paternal aunt, paternal grandmother, and paternal uncle; Hypertension in his paternal aunt and paternal grandmother; Liver disease in his paternal uncle. No Known Allergies    Outpatient Encounter Prescriptions as of 05/08/2013  Medication Sig  . Aspirin-Caffeine (BC FAST PAIN  RELIEF PO) Take 1 packet by mouth daily as needed (for pain).  . pantoprazole (PROTONIX) 40 MG tablet Take 1 tablet (40 mg total) by mouth daily.  . traMADol (ULTRAM) 50 MG tablet Take 1 tablet (50 mg total) by mouth every 6 (six) hours as needed.  Marland Kitchen MOVIPREP 100 G SOLR Take 1 kit (200 g total) by mouth once.     REVIEW OF SYSTEMS  : All other systems reviewed and negative except where noted in the History of Present Illness.   PHYSICAL EXAM: BP 118/60  Pulse 100  Ht 5\' 8"  (1.727 m)  Wt 217 lb 2 oz (98.487 kg)  BMI 33.02 kg/m2 General: Well developed black male in no acute distress Head: Normocephalic and atraumatic Eyes:  Sclerae anicteric, conjunctiva pink. Ears: Normal auditory acuity. Lungs: Clear throughout to auscultation. Heart: Regular rate and rhythm. Abdomen: Soft, non-distended.  Normal bowel sounds.  Mild RLQ and mid-abdominal TTP without R/R/G. Rectal:  Deferred.  But picture of stool was maroon in color. Musculoskeletal: Symmetrical with no gross deformities  Skin: No lesions on visible extremities Extremities: No edema  Neurological: Alert oriented x 4, grossly non-focal Psychological:  Alert and cooperative. Normal mood and affect  ASSESSMENT AND PLAN: -Gastrointestinal bleeding:  Persistent for 4 days.  Suspect lower source with maroon colored stools.  BUN not elevated.  Also with RLQ abdominal pain.  CT scan negative.  Hgb was 12.8 grams, but down three grams from baseline only one month prior.  *Discussed with Dr. Henrene Pastor  and Dr. Hilarie Fredrickson.  Dr. Henrene Pastor agreed to perform outpatient colonoscopy tomorrow, 3/6 at [facility].  Will repeat CBC today.  Patient informed to return to the ED if bleeding worsens in the interim or if he develops any recurrent dizziness, etc.

## 2013-05-08 NOTE — Progress Notes (Signed)
Agree with initial assessment and plans for colonoscopy. Would permit for upper endoscopy as well, in case colonoscopy is unrevealing.

## 2013-05-09 ENCOUNTER — Encounter (HOSPITAL_COMMUNITY): Payer: Self-pay

## 2013-05-09 ENCOUNTER — Ambulatory Visit (HOSPITAL_COMMUNITY)
Admission: RE | Admit: 2013-05-09 | Discharge: 2013-05-09 | Disposition: A | Discharge status: Home / Self Care | Payer: 59 | Source: Ambulatory Visit | Attending: Internal Medicine | Admitting: Internal Medicine

## 2013-05-09 ENCOUNTER — Other Ambulatory Visit: Payer: Self-pay | Admitting: *Deleted

## 2013-05-09 ENCOUNTER — Encounter (HOSPITAL_COMMUNITY)
Admission: RE | Disposition: A | Discharge status: Home / Self Care | Payer: Self-pay | Source: Ambulatory Visit | Attending: Internal Medicine

## 2013-05-09 DIAGNOSIS — K2990 Gastroduodenitis, unspecified, without bleeding: Secondary | ICD-10-CM

## 2013-05-09 DIAGNOSIS — K2980 Duodenitis without bleeding: Secondary | ICD-10-CM

## 2013-05-09 DIAGNOSIS — K921 Melena: Secondary | ICD-10-CM | POA: Insufficient documentation

## 2013-05-09 DIAGNOSIS — Z79899 Other long term (current) drug therapy: Secondary | ICD-10-CM | POA: Insufficient documentation

## 2013-05-09 DIAGNOSIS — I10 Essential (primary) hypertension: Secondary | ICD-10-CM | POA: Insufficient documentation

## 2013-05-09 DIAGNOSIS — K2970 Gastritis, unspecified, without bleeding: Secondary | ICD-10-CM | POA: Insufficient documentation

## 2013-05-09 DIAGNOSIS — F411 Generalized anxiety disorder: Secondary | ICD-10-CM | POA: Insufficient documentation

## 2013-05-09 DIAGNOSIS — K625 Hemorrhage of anus and rectum: Secondary | ICD-10-CM | POA: Insufficient documentation

## 2013-05-09 DIAGNOSIS — K922 Gastrointestinal hemorrhage, unspecified: Secondary | ICD-10-CM

## 2013-05-09 DIAGNOSIS — R1031 Right lower quadrant pain: Secondary | ICD-10-CM

## 2013-05-09 DIAGNOSIS — K5730 Diverticulosis of large intestine without perforation or abscess without bleeding: Secondary | ICD-10-CM | POA: Insufficient documentation

## 2013-05-09 HISTORY — PX: COLONOSCOPY: SHX5424

## 2013-05-09 HISTORY — PX: ESOPHAGOGASTRODUODENOSCOPY: SHX5428

## 2013-05-09 SURGERY — COLONOSCOPY
Anesthesia: Moderate Sedation

## 2013-05-09 MED ORDER — DIPHENHYDRAMINE HCL 50 MG/ML IJ SOLN
INTRAMUSCULAR | Status: DC | PRN
  Administered 2013-05-09: 25 mg via INTRAVENOUS

## 2013-05-09 MED ORDER — DIPHENHYDRAMINE HCL 50 MG/ML IJ SOLN
INTRAMUSCULAR | Status: AC
  Filled 2013-05-09: qty 1

## 2013-05-09 MED ORDER — SODIUM CHLORIDE 0.9 % IV SOLN
INTRAVENOUS | Status: DC
  Administered 2013-05-09: 500 mL via INTRAVENOUS

## 2013-05-09 MED ORDER — MIDAZOLAM HCL 10 MG/2ML IJ SOLN
INTRAMUSCULAR | Status: DC | PRN
  Administered 2013-05-09: 2 mg via INTRAVENOUS
  Administered 2013-05-09: 1 mg via INTRAVENOUS
  Administered 2013-05-09 (×2): 2 mg via INTRAVENOUS

## 2013-05-09 MED ORDER — SODIUM CHLORIDE 0.9 % IV SOLN: INTRAVENOUS | Status: DC

## 2013-05-09 MED ORDER — FENTANYL CITRATE 0.05 MG/ML IJ SOLN
INTRAMUSCULAR | Status: DC | PRN
  Administered 2013-05-09 (×3): 25 ug via INTRAVENOUS

## 2013-05-09 MED ORDER — MIDAZOLAM HCL 10 MG/2ML IJ SOLN
INTRAMUSCULAR | Status: AC
  Filled 2013-05-09: qty 2

## 2013-05-09 MED ORDER — FENTANYL CITRATE 0.05 MG/ML IJ SOLN
INTRAMUSCULAR | Status: AC
  Filled 2013-05-09: qty 4

## 2013-05-09 NOTE — Op Note (Signed)
[facility] [place], [address]   ENDOSCOPY PROCEDURE REPORT  PATIENT: Leslie Mcmillan, Leslie Mcmillan  MR#: [id number] BIRTHDATE: October 03, 1970 , 42  yrs. old GENDER: Male ENDOSCOPIST: Eustace Quail, MD REFERRED BY:  .  Self / Office PROCEDURE DATE:  05/09/2013 PROCEDURE:  EGD w/ biopsy for H.pylori ASA CLASS:     Class I INDICATIONS:  Hematochezia. MEDICATIONS: There was residual sedation effect present from prior procedure. TOPICAL ANESTHETIC: Cetacaine Spray  DESCRIPTION OF PROCEDURE: After the risks benefits and alternatives of the procedure were thoroughly explained, informed consent was obtained.  The [id number] ([id number]) endoscope was introduced through the mouth and advanced to the second portion of the duodenum. Without limitations.  The instrument was slowly withdrawn as the mucosa was fully examined.      Normal esophagus.  The stomach revealed very mild prepyloric erythema.  The duodenum revealed duodenitis as manifested by a nonerosive erythema and mild edema.  The post bulbar duodenum was normal.  CLO biopsy taken.  Retroflexed views revealed no abnormalities.     The scope was then withdrawn from the patient and the procedure completed.  COMPLICATIONS: There were no complications. ENDOSCOPIC IMPRESSION: 1. Mild gastroduodenitis. Otherwise normal.  RECOMMENDATIONS: 1.  Obtain Prilosec OTC 20 mg daily for 2 weeks 2.  Rx CLO if positive 3. Followup with Dr. Hilarie Fredrickson. May wish to consider some investigation of the small bowel  REPEAT EXAM:  eSigned:  Eustace Quail, MD 05/09/2013 5:20 PM   GN:FAOZHY, Ulice Dash MD

## 2013-05-09 NOTE — Interval H&P Note (Signed)
History and Physical Interval Note:  Case reviewed yesterday office. See H&P. For colonoscopy at this time  05/09/2013 4:11 PM  Leslie Mcmillan  has presented today for surgery, with the diagnosis of RECTAL BLEEDING   The various methods of treatment have been discussed with the patient and family. After consideration of risks, benefits and other options for treatment, the patient has consented to  Procedure(s) with comments: COLONOSCOPY (N/A) ESOPHAGOGASTRODUODENOSCOPY (EGD) (N/A) - possible egd depending on colon results as a surgical intervention .  The patient's history has been reviewed, patient examined, no change in status, stable for surgery.  I have reviewed the patient's chart and labs.  Questions were answered to the patient's satisfaction.     Scarlette Shorts

## 2013-05-09 NOTE — H&P (View-Only) (Signed)
Agree with initial assessment and plans for colonoscopy. Would permit for upper endoscopy as well, in case colonoscopy is unrevealing. 

## 2013-05-09 NOTE — Discharge Instructions (Signed)
Colonoscopy Care After These instructions give you information on caring for yourself after your procedure. Your doctor may also give you more specific instructions. Call your doctor if you have any problems or questions after your procedure. HOME CARE  Take it easy for the next 24 hours.  Rest.  Walk or use warm packs on your belly (abdomen) if you have belly cramping or gas.  Do not drive for 24 hours.  You may shower.  Do not sign important papers or use machinery for 24 hours.  Drink enough fluids to keep your pee (urine) clear or pale yellow.  Resume your normal diet. Avoid heavy or fried foods.  Avoid alcohol.  Continue taking your normal medicines.  Only take medicine as told by your doctor. Do not take aspirin. If you had growths (polyps) removed:  Do not take aspirin.  Do not drink alcohol for 7 days or as told by your doctor.  Eat a soft diet for 24 hours. GET HELP RIGHT AWAY IF:  You have a fever.  You pass clumps of tissue (blood clots) or fill the toilet with blood.  You have belly pain that gets worse and medicine does not help.  Your belly is puffy (swollen).  You feel sick to your stomach (nauseous) or throw up (vomit). MAKE SURE YOU:  Understand these instructions.  Will watch your condition.  Will get help right away if you are not doing well or get worse. Document Released: 03/25/2010 Document Revised: 05/15/2011 Document Reviewed: 10/28/2012 [facility] Patient Information 2014 [place]. Gastrointestinal Endoscopy Care After Refer to this sheet in the next few weeks. These instructions provide you with information on caring for yourself after your procedure. Your caregiver may also give you more specific instructions. Your treatment has been planned according to current medical practices, but problems sometimes occur. Call your caregiver if you have any problems or questions after your procedure. HOME CARE INSTRUCTIONS  If you were  given medicine to help you relax (sedative), do not drive, operate machinery, or sign important documents for 24 hours.  Avoid alcohol and hot or warm beverages for the first 24 hours after the procedure.  Only take over-the-counter or prescription medicines for pain, discomfort, or fever as directed by your caregiver. You may resume taking your normal medicines unless your caregiver tells you otherwise. Ask your caregiver when you may resume taking medicines that may cause bleeding, such as aspirin, clopidogrel, or warfarin.  You may return to your normal diet and activities on the day after your procedure, or as directed by your caregiver. Walking may help to reduce any bloated feeling in your abdomen.  Drink enough fluids to keep your urine clear or pale yellow.  You may gargle with salt water if you have a sore throat. SEEK IMMEDIATE MEDICAL CARE IF:  You have severe nausea or vomiting.  You have severe abdominal pain, abdominal cramps that last longer than 6 hours, or abdominal swelling (distention).  You have severe shoulder or back pain.  You have trouble swallowing.  You have shortness of breath, your breathing is shallow, or you are breathing faster than normal.  You have a fever or a rapid heartbeat.  You vomit blood or material that looks like coffee grounds.  You have bloody, black, or tarry stools. MAKE SURE YOU:  Understand these instructions.  Will watch your condition.  Will get help right away if you are not doing well or get worse. Document Released: 10/05/2003 Document Revised: 08/22/2011 Document Reviewed:  05/23/2011 [employer] Patient Information 2014 [place].

## 2013-05-09 NOTE — Op Note (Signed)
[facility] [place], [address]   COLONOSCOPY PROCEDURE REPORT  PATIENT: Eliam, Snapp  MR#: [id number] BIRTHDATE: [birth year] , 42  yrs. old GENDER: Male ENDOSCOPIST: Eustace Quail, MD REFERRED BY:.  Self / Office PROCEDURE DATE:  05/09/2013 PROCEDURE:   Colonoscopy, diagnostic First Screening Colonoscopy - Avg.  risk and is 50 yrs.  old or older - No.  Prior Negative Screening - Now for repeat screening. N/A  History of Adenoma - Now for follow-up colonoscopy & has been > or = to 3 yrs.  N/A  Polyps Removed Today? No.  Recommend repeat exam, <10 yrs? ASA CLASS:   Class I INDICATIONS:rectal bleeding. MEDICATIONS: Fentanyl 75 mcg IV, Versed 7 mg IV, and Benadryl 25 milligrams IV  DESCRIPTION OF PROCEDURE:   After the risks benefits and alternatives of the procedure were thoroughly explained, informed consent was obtained.  A digital rectal exam revealed no abnormalities of the rectum.   The pentax [id number]  endoscope was introduced through the anus and advanced to the cecum, which was identified by both the appendix and ileocecal valve. No adverse events experienced.   The quality of the prep was excellent, using MoviPrep  The instrument was then slowly withdrawn as the colon was fully examined.    COLON FINDINGS: Moderate diverticulosis was noted  in the left colon.   The colon mucosa was otherwise normal.. No blood or bleeding present.  Retroflexed views revealed no abnormalities. The time to cecum=3 minutes 0 seconds.  Withdrawal time=8 minutes 0 seconds.  The scope was withdrawn and the procedure completed. COMPLICATIONS: There were no complications.  ENDOSCOPIC IMPRESSION: 1.   Moderate diverticulosis was noted in the left colon 2.   The colon mucosa was otherwise normal  RECOMMENDATIONS: 1.Upper endoscopy today (she reports)   eSigned:  Eustace Quail, MD 05/09/2013 5:09 PM   cc: Zenovia Jarred MD and The Patient

## 2013-05-12 ENCOUNTER — Telehealth: Payer: Self-pay

## 2013-05-12 ENCOUNTER — Encounter: Payer: Self-pay | Admitting: Internal Medicine

## 2013-05-12 ENCOUNTER — Encounter (HOSPITAL_COMMUNITY): Payer: Self-pay | Admitting: Internal Medicine

## 2013-05-12 LAB — CLOTEST (H. PYLORI), BIOPSY: Helicobacter screen: NEGATIVE

## 2013-05-12 NOTE — Telephone Encounter (Signed)
Patient is still having pain.  He will come in and see Dr. Hilarie Fredrickson tomorrow at 30

## 2013-05-12 NOTE — Telephone Encounter (Signed)
Message copied by Marlon Pel on Mon May 12, 2013  3:44 PM ------      Message from: Jerene Bears      Created: Mon May 12, 2013 10:43 AM      Regarding: FW: Followup       Sheri      Can you get this guy back for followup either with me or APP      Thanks      JMP            ----- Message -----         From: Irene Shipper, MD         Sent: 05/09/2013   6:47 PM           To: Jerene Bears, MD      Subject: Followup                                                 Ulice Dash.,      This patient that you staffed with Janett Billow had diverticulosis on colonoscopy and mild gastroduodenitis on EGD. No active bleeding or blood. You may want to work up his small bowel further, such as Meckel scan since he was complaining of right lower quadrant pain. At minimum, probably an office followup in a few weeks would be reasonable. John       ------

## 2013-05-13 ENCOUNTER — Ambulatory Visit (INDEPENDENT_AMBULATORY_CARE_PROVIDER_SITE_OTHER): Payer: 59 | Admitting: Internal Medicine

## 2013-05-13 ENCOUNTER — Encounter: Payer: Self-pay | Admitting: Internal Medicine

## 2013-05-13 VITALS — BP 112/64 | HR 88 | Ht 68.0 in | Wt 216.0 lb

## 2013-05-13 DIAGNOSIS — R109 Unspecified abdominal pain: Secondary | ICD-10-CM

## 2013-05-13 DIAGNOSIS — K625 Hemorrhage of anus and rectum: Secondary | ICD-10-CM

## 2013-05-13 MED ORDER — HYDROCODONE-ACETAMINOPHEN 5-325 MG PO TABS: 1.0000 | ORAL_TABLET | Freq: Four times a day (QID) | ORAL | Status: DC | PRN

## 2013-05-13 MED ORDER — HYOSCYAMINE SULFATE 0.125 MG SL SUBL: 0.1250 mg | SUBLINGUAL_TABLET | SUBLINGUAL | Status: DC | PRN

## 2013-05-13 NOTE — Progress Notes (Signed)
Subjective:    Patient ID: Leslie Mcmillan, male    DOB: 16-Sep-1970, 44 y.o.   MRN: [id number]  HPI Leslie Mcmillan is a 43 yo male seen recently by Alonza Bogus, PA-C to evaluate abdominal pain and rectal bleeding after an ER visit who is here for followup. He was seen last week in the office and was found to have a new anemia and ongoing abdominal pain. Colonoscopy and upper endoscopy were arranged with Dr. Henrene Pastor. These studies were performed on 05/09/2013. Colonoscopy to the cecum with excellent prep showed moderate diverticulosis in the left colon but was otherwise normal. No blood was present. Upper endoscopy performed on the same day revealed a normal esophagus, very mild prepyloric erythema and mild bulbar duodenitis. No ulcers. Postbulbar duodenum was normal. H. pylori biopsy taken and negative. He has been on Protonix 40 mg daily.  He returns today still having mid and right lower quadrant abdominal pain and pressure. He is having episodic intense stabbing pain which then "releases". Appetite continues to be decreased and he has avoided eating because eating makes his pain worse. He's had some nausea and vomited twice yesterday. Emesis was nonbloody and nonbilious. Bowel movements really have not returned to normal since colonoscopy preparation. He's had no further rectal bleeding or melena. No known fevers or chills.  Of note he had an episode of similar type abdominal pain and 2012 and was seen in the ER. He remembers a CT scan which had abnormality.  Review of Systems As per history of present illness, otherwise negative  Current Medications, Allergies, Past Medical History, Past Surgical History, Family History and Social History were reviewed in [employer] record.     Objective:   Physical Exam BP 112/64  Pulse 88  Ht 5\' 8"  (1.727 m)  Wt 216 lb (97.977 kg)  BMI 32.85 kg/m2 Constitutional: Well-developed and well-nourished. No distress. HEENT:  Normocephalic and atraumatic. Oropharynx is clear and moist. No oropharyngeal exudate. Conjunctivae are normal.  No scleral icterus. Neck: Neck supple. Trachea midline. Cardiovascular: Normal rate, regular rhythm and intact distal pulses. No M/R/G Pulmonary/chest: Effort normal and breath sounds normal. No wheezing, rales or rhonchi. Abdominal: Soft, moderate mid and right lower quadrant abdominal tenderness without rebound or guarding, nondistended. Bowel sounds active throughout.  Extremities: no clubbing, cyanosis, or edema Lymphadenopathy: No cervical adenopathy noted. Neurological: Alert and oriented to person place and time. Skin: Skin is warm and dry. No rashes noted. Psychiatric: Normal mood and affect. Behavior is normal.  CBC    Component Value Date/Time   WBC 9.3 05/08/2013 1142   RBC 3.96* 05/08/2013 1142   HGB 11.5* 05/08/2013 1142   HCT 34.1* 05/08/2013 1142   PLT 255.0 05/08/2013 1142   MCV 86.2 05/08/2013 1142   MCH 29.2 05/05/2013 0110   MCHC 33.6 05/08/2013 1142   RDW 14.7* 05/08/2013 1142   LYMPHSABS 2.8 05/08/2013 1142   MONOABS 0.9 05/08/2013 1142   EOSABS 0.2 05/08/2013 1142   BASOSABS 0.0 05/08/2013 1142    CMP     Component Value Date/Time   NA 138 05/05/2013 0110   K 4.0 05/05/2013 0110   CL 101 05/05/2013 0110   CO2 24 05/05/2013 0110   GLUCOSE 134* 05/05/2013 0110   BUN 20 05/05/2013 0110   CREATININE 1.31 05/05/2013 0110   CALCIUM 9.2 05/05/2013 0110   PROT 6.9 05/05/2013 0110   ALBUMIN 3.5 05/05/2013 0110   AST 22 05/05/2013 0110   ALT 19 05/05/2013 0110  ALKPHOS 69 05/05/2013 0110   BILITOT 0.3 05/05/2013 0110   GFRNONAA 66* 05/05/2013 0110   GFRAA 77* 05/05/2013 0110     CT ABDOMEN AND PELVIS WITH CONTRAST -- 2012   Technique:  Multidetector CT imaging of the abdomen and pelvis was performed following the standard protocol during bolus administration of intravenous contrast.   Contrast: 100 ml Omnipaque-300   Comparison: None.   Findings: Lung bases are clear.  No evidence of free  air. Decreased attenuation of the liver could represent hepatic steatosis.  Normal appearance of the gallbladder, portal venous system, pancreas, spleen, adrenal glands and both kidneys.  Normal appearance of the prostate, seminal vesicles and urinary bladder. Normal appearance of the appendix.  No evidence for free fluid or lymphadenopathy.  There is fullness of the proximal jejunum near the ligament of Treitz with mild mesenteric stranding.  Findings could represent focal inflammation in this area.  There is mild dilatation of the duodenum proximal to jejunal inflammation.   IMPRESSION: There is mild inflammation surrounding the proximal jejunum near the ligament of Treitz.  The findings could represent focal enteritis.  Etiology of this inflammation is unknown.  No evidence for obstruction.  ______________________________________________________________________________ CT ABDOMEN AND PELVIS WITH CONTRAST -- March 2015   TECHNIQUE: Multidetector CT imaging of the abdomen and pelvis was performed using the standard protocol following bolus administration of intravenous contrast.   CONTRAST:  165mL OMNIPAQUE IOHEXOL 300 MG/ML  SOLN   COMPARISON:  None.   FINDINGS: BODY WALL: Unremarkable.   LOWER CHEST: Unremarkable.   ABDOMEN/PELVIS:   Liver: No focal abnormality.   Biliary: No evidence of biliary obstruction or stone.   Pancreas: Unremarkable.   Spleen: Unremarkable.   Adrenals: Unremarkable.   Kidneys and ureters: No hydronephrosis or stone. Hypo enhancement of the lower pole right kidney is associated with cortical thinning, consistent with chronic scarring. This is new from 2012.   Bladder: Unremarkable.   Reproductive: Unremarkable.   Bowel: No obstruction. Normal appendix.   Retroperitoneum: No mass or adenopathy.   Peritoneum: No free fluid or gas.   Vascular: Early aortoiliac atherosclerosis.   OSSEOUS: No acute abnormalities.   IMPRESSION: No  acute intra-abdominal abnormality.       Assessment & Plan:  43 yo male seen recently by Alonza Bogus, PA-C to evaluate abdominal pain and rectal bleeding after an ER visit who is here for followup.  1.  Abd pain (middle abd and RLQ)/nausea/recent GI bleeding -- upper endoscopy and colonoscopy unremarkable. Given pain this is not felt to be diverticular bleeding. He continues to have significant pain and imaging reviewed from 2012 significant for inflammation in the proximal jejunum. I have recommended a Meckel's scan given pain and bleeding. If this is negative I would proceed with video capsule endoscopy for further small bowel imaging and to evaluate for IBD. I asked that he avoid NSAIDs. Low fiber/low residue diet with focus on fluids and hydration. Prescription for Levsin 0.25 every 4 hours as needed for spasm/pain. Vicodin prescription 1-2 tabs every 6 hours when necessary pain #45. Work note. Continue pantoprazole 40 mg daily for now. H. pylori evaluation was negative, and gastroduodenitis is not felt to explain recent symptoms.

## 2013-05-13 NOTE — Patient Instructions (Addendum)
You have been given a separate informational sheet regarding your tobacco use, the importance of quitting and local resources to help you quit.  We have sent the following medications to your pharmacy for you to pick up at your convenience: Levsin Vicodin  avoid NSAIDS    You have been scheduled for a Meckel scan at [facility] on 05/22/2013 at 7:30am please arrive 15 minutes prior to your scan. Nothing to eat or drink after midnight the morning of your scan. If you need to reschedule please call [phone]   Low-Fiber Diet Fiber is found in fruits, vegetables, and grains. A low-fiber diet restricts fibrous foods that are not digested in the small intestine. A diet containing about 10 grams of fiber is considered low fiber.  PURPOSE  To prevent blockage of a partially obstructed or narrowed gastrointestinal tract.  To reduce fecal weight and volume.  To slow the movement of feces. WHEN IS THIS DIET USED?  It may be used during the acute phase of Crohn disease, ulcerative colitis, regional enteritis, or diverticulitis.  It may be used if your intestinal or esophageal tubes are narrowing (stenosis).  It may be used as a transitional diet following surgery, injury (trauma), or illness. CHOOSING FOODS Check labels, especially on foods from the starch list. Often times, dietary fiber content is listed on the nutrition facts panel. Please ask your Registered Dietitian if you have questions about specific foods that are related to your condition, especially if the food is not listed on this handout. Breads and Starches  Allowed: White, [place], and pita breads, plain rolls, buns, or sweet rolls, doughnuts, waffles, pancakes, bagels. Plain muffins, biscuits, matzoth. Soda, saltine, graham crackers. Pretzels, rusks, melba toast, zwieback. Cooked cereals: cornmeal, farina, or cream cereals. Dry cereals: refined corn, wheat, rice, and oat cereals (check label). Potatoes prepared any way  without skins, refined macaroni, spaghetti, noodles, refined rice.  Avoid: Whole-wheat bread, rolls, and crackers. Multigrains, rye, bran seeds, nuts, or coconut. Cereals containing whole grains, multigrains, bran, coconut, nuts, raisins. Cooked or dry oatmeal. Coarse wheat cereals, granola. Cereals advertised as "high fiber." Potato skins. Whole-grain pasta, wild or brown rice. Popcorn. Vegetables  Allowed: Strained tomato and vegetable juices. Fresh lettuce, cucumber, spinach. Well-cooked or canned: asparagus, bean sprouts, broccoli, cut green beans, cauliflower, pumpkin, beets, mushrooms, yellow squash, tomato, tomato sauce, zucchini, turnips.Keep servings limited to  cup.  Avoid: Fresh, cooked, or canned: artichokes, baked beans, beet greens, Brussels sprouts, corn, kale, legumes, peas, sweet potatoes. Avoid large servings of any vegetables. Fruit  Allowed: All fruit juices except prune juice. Cooked or canned fruits without skin and seeds: apricots, applesauce, cantaloupe, cherries, grapefruit, grapes, kiwi, mandarin oranges, peaches, pears, fruit cocktail, pineapple, plums, watermelon. Fresh without skin: banana, grapes, cantaloupe, avocado, cherries, pineapple, kiwi, nectarines, peaches, blueberries. Keep servings limited to  cup or 1 piece.  Avoid: Fresh: apples with or without skin, apricots, mangoes, pears, raspberries, strawberries. Prune juice and juices with pulp, stewed or dried prunes. Dried fruits, raisins, dates. Avoid large servings of all fresh fruits. Meat and Protein Substitutes  Allowed: Ground or well-cooked tender beef, ham, veal, lamb, pork, poultry. Eggs, plain cheese. Fish, oysters, shrimp, lobster, other seafood. Liver, organ meats. Smooth nut butters.  Avoid: Tough, fibrous meats with gristle. Chunky nut butter.Cheese with seeds, nuts, or other foods not allowed. Nuts, seeds, legumes, dried peas, beans, lentils. Dairy  Allowed: All milk products except those not  allowed.  Avoid: Yogurt or cheese that contains nuts, seeds, or  added fruit. Soups and Combination Foods  Allowed: Bouillon, broth, or cream soups made from allowed foods. Any strained soup. Casseroles or mixed dishes made with allowed foods.  Avoid: Soups made from vegetables that are not allowed or that contain other foods not allowed. Desserts and Sweets  Allowed:Plain cakes and cookies, pie made with allowed fruit, pudding, custard, cream pie. Gelatin, fruit, ice, sherbet, frozen ice pops. Ice cream, ice milk without nuts. Plain hard candy, honey, jelly, molasses, syrup, sugar, chocolate syrup, gumdrops, marshmallows.  Avoid: Desserts, cookies, or candies that contain nuts, peanut butter, dried fruits. Jams, preserves with seeds, marmalade. Fats and Oils  Allowed:Margarine, butter, cream, mayonnaise, salad oils, plain salad dressings made from allowed foods.  Avoid: Seeds, nuts, olives. Beverages  Allowed: All, except those listed to avoid.  Avoid: Fruit juices with high pulp, prune juice. Condiments  Allowed:Ketchup, mustard, horseradish, vinegar, cream sauce, cheese sauce, cocoa powder. Spices in moderation: allspice, basil, bay leaves, celery powder or leaves, cinnamon, cumin powder, curry powder, ginger, mace, marjoram, onion or garlic powder, oregano, paprika, parsley flakes, ground pepper, rosemary, sage, savory, tarragon, thyme, turmeric.  Avoid: Coconut, pickles. SAMPLE MENU Breakfast   cup orange juice.  1 boiled egg.  1 slice white toast.  Margarine.   cup cornflakes.  1 cup milk.  Beverage. Lunch   cup chicken noodle soup.  2 to 3 oz sliced roast beef.  2 slices white bread.  Mayonnaise.   cup tomato juice.  1 small banana.  Beverage. Dinner  3 oz baked chicken.   cup scalloped potatoes.   cup cooked beets.  White dinner roll.  Margarine.   cup canned peaches.  Beverage. Document Released: 08/12/2001 Document  Revised: 10/23/2012 Document Reviewed: 03/09/2011 [facility] Patient Information 2014 [place].

## 2013-05-22 ENCOUNTER — Other Ambulatory Visit: Payer: Self-pay

## 2013-05-22 ENCOUNTER — Ambulatory Visit (HOSPITAL_COMMUNITY)
Admission: RE | Admit: 2013-05-22 | Discharge: 2013-05-22 | Disposition: A | Discharge status: Home / Self Care | Payer: 59 | Source: Ambulatory Visit | Attending: Internal Medicine | Admitting: Internal Medicine

## 2013-05-22 DIAGNOSIS — K625 Hemorrhage of anus and rectum: Secondary | ICD-10-CM

## 2013-05-22 DIAGNOSIS — R109 Unspecified abdominal pain: Secondary | ICD-10-CM

## 2013-05-22 DIAGNOSIS — R933 Abnormal findings on diagnostic imaging of other parts of digestive tract: Secondary | ICD-10-CM

## 2013-05-22 DIAGNOSIS — R1903 Right lower quadrant abdominal swelling, mass and lump: Secondary | ICD-10-CM | POA: Insufficient documentation

## 2013-05-22 DIAGNOSIS — K6389 Other specified diseases of intestine: Secondary | ICD-10-CM

## 2013-05-22 DIAGNOSIS — K922 Gastrointestinal hemorrhage, unspecified: Secondary | ICD-10-CM | POA: Insufficient documentation

## 2013-05-22 MED ORDER — SODIUM PERTECHNETATE TC 99M INJECTION
10.0000 | Freq: Once | INTRAVENOUS | Status: AC | PRN
  Administered 2013-05-22: 10 via INTRAVENOUS

## 2013-05-30 ENCOUNTER — Ambulatory Visit (HOSPITAL_COMMUNITY): Payer: 59

## 2013-05-30 ENCOUNTER — Ambulatory Visit (HOSPITAL_COMMUNITY)
Admission: RE | Admit: 2013-05-30 | Discharge: 2013-05-30 | Disposition: A | Discharge status: Home / Self Care | Payer: 59 | Source: Ambulatory Visit | Attending: Internal Medicine | Admitting: Internal Medicine

## 2013-05-30 ENCOUNTER — Telehealth: Payer: Self-pay | Admitting: Gastroenterology

## 2013-05-30 ENCOUNTER — Other Ambulatory Visit: Payer: Self-pay | Admitting: Internal Medicine

## 2013-05-30 DIAGNOSIS — K625 Hemorrhage of anus and rectum: Secondary | ICD-10-CM

## 2013-05-30 DIAGNOSIS — R933 Abnormal findings on diagnostic imaging of other parts of digestive tract: Secondary | ICD-10-CM

## 2013-05-30 DIAGNOSIS — R109 Unspecified abdominal pain: Secondary | ICD-10-CM

## 2013-05-30 DIAGNOSIS — R1909 Other intra-abdominal and pelvic swelling, mass and lump: Secondary | ICD-10-CM | POA: Insufficient documentation

## 2013-05-30 DIAGNOSIS — K6389 Other specified diseases of intestine: Secondary | ICD-10-CM

## 2013-05-30 DIAGNOSIS — Z1389 Encounter for screening for other disorder: Secondary | ICD-10-CM | POA: Insufficient documentation

## 2013-05-30 MED ORDER — GADOBENATE DIMEGLUMINE 529 MG/ML IV SOLN
20.0000 mL | Freq: Once | INTRAVENOUS | Status: AC | PRN
  Administered 2013-05-30: 20 mL via INTRAVENOUS

## 2013-05-30 NOTE — Telephone Encounter (Signed)
Spoke with patient and he does not know which medications he needs. He will call back when he gets home and finds this out.

## 2013-06-03 ENCOUNTER — Other Ambulatory Visit: Payer: Self-pay

## 2013-06-03 DIAGNOSIS — K6389 Other specified diseases of intestine: Secondary | ICD-10-CM

## 2013-06-03 MED ORDER — HYOSCYAMINE SULFATE 0.125 MG SL SUBL: 0.1250 mg | SUBLINGUAL_TABLET | SUBLINGUAL | Status: DC | PRN

## 2013-06-03 MED ORDER — HYDROCODONE-ACETAMINOPHEN 5-325 MG PO TABS: 1.0000 | ORAL_TABLET | Freq: Four times a day (QID) | ORAL | Status: DC | PRN

## 2013-06-03 NOTE — Progress Notes (Signed)
Patient requesting a refill of hydrocodone.  I have initiated surgical referral, but they haven't scheduled it yet.  Ok to refill hydrocodone?

## 2013-06-09 ENCOUNTER — Ambulatory Visit: Payer: 59 | Admitting: Gastroenterology

## 2013-06-10 ENCOUNTER — Telehealth: Payer: Self-pay | Admitting: Internal Medicine

## 2013-06-10 NOTE — Telephone Encounter (Signed)
Patient needs an extension on his work note .  His appt with Dr. Johney Maine is not until 06/23/13.  I will write a letter until 06/23/13 when he has his appt with Dr. Johney Maine.  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

## 2013-06-23 ENCOUNTER — Encounter (INDEPENDENT_AMBULATORY_CARE_PROVIDER_SITE_OTHER): Payer: Self-pay | Admitting: Surgery

## 2013-06-23 ENCOUNTER — Telehealth: Payer: Self-pay | Admitting: Internal Medicine

## 2013-06-23 ENCOUNTER — Ambulatory Visit (INDEPENDENT_AMBULATORY_CARE_PROVIDER_SITE_OTHER): Payer: 59 | Admitting: Surgery

## 2013-06-23 ENCOUNTER — Other Ambulatory Visit: Payer: Self-pay | Admitting: Internal Medicine

## 2013-06-23 VITALS — BP 124/82 | HR 80 | Temp 97.2°F | Resp 14 | Ht 70.0 in | Wt 217.0 lb

## 2013-06-23 DIAGNOSIS — R933 Abnormal findings on diagnostic imaging of other parts of digestive tract: Secondary | ICD-10-CM

## 2013-06-23 DIAGNOSIS — D3A012 Benign carcinoid tumor of the ileum: Secondary | ICD-10-CM | POA: Insufficient documentation

## 2013-06-23 DIAGNOSIS — K668 Other specified disorders of peritoneum: Secondary | ICD-10-CM

## 2013-06-23 DIAGNOSIS — K6389 Other specified diseases of intestine: Secondary | ICD-10-CM

## 2013-06-23 DIAGNOSIS — K2980 Duodenitis without bleeding: Secondary | ICD-10-CM

## 2013-06-23 NOTE — Telephone Encounter (Signed)
Message on machine says patient is not accepting calls at this time.  Will try again later

## 2013-06-23 NOTE — Patient Instructions (Addendum)
Please consider the recommendations that we have given you today:  Consider surgery to remove the mass in your abdomen.  Post likely this will involve removing a section of your small intestine as well.  Quit smoking.  This will help minimize pain and healing issues.  Followup with your gastroenterologist for further concerns about abdominal cramping and need for further testing.  They are leaning towards doing capsule endoscopy.  Please call their office to schedule that [phone]  See the Handout(s) we have given you.  Please call our office at [phone] if you wish to schedule surgery or if you have further questions / concerns.   GETTING TO GOOD BOWEL HEALTH. Irregular bowel habits such as constipation and diarrhea can lead to many problems over time.  Having one soft bowel movement a day is the most important way to prevent further problems.  The anorectal canal is designed to handle stretching and feces to safely manage our ability to get rid of solid waste (feces, poop, stool) out of our body.  BUT, hard constipated stools can act like ripping concrete bricks and diarrhea can be a burning fire to this very sensitive area of our body, causing inflamed hemorrhoids, anal fissures, increasing risk is perirectal abscesses, abdominal pain/bloating, an making irritable bowel worse.     The goal: ONE SOFT BOWEL MOVEMENT A DAY!  To have soft, regular bowel movements:    Drink at least 8 tall glasses of water a day.     Take plenty of fiber.  Fiber is the undigested part of plant food that passes into the colon, acting s "natures broom" to encourage bowel motility and movement.  Fiber can absorb and hold large amounts of water. This results in a larger, bulkier stool, which is soft and easier to pass. Work gradually over several weeks up to 6 servings a day of fiber (25g a day even more if needed) in the form of: o Vegetables -- Root (potatoes, carrots, turnips), leafy green (lettuce, salad  greens, celery, spinach), or cooked high residue (cabbage, broccoli, etc) o Fruit -- Fresh (unpeeled skin & pulp), Dried (prunes, apricots, cherries, etc ),  or stewed ( applesauce)  o Whole grain breads, pasta, etc (whole wheat)  o Bran cereals    Bulking Agents -- This type of water-retaining fiber generally is easily obtained each day by one of the following:  o Psyllium bran -- The psyllium plant is remarkable because its ground seeds can retain so much water. This product is available as Metamucil, Konsyl, Effersyllium, Per Diem Fiber, or the less expensive generic preparation in drug and health food stores. Although labeled a laxative, it really is not a laxative.  o Methylcellulose -- This is another fiber derived from wood which also retains water. It is available as Citrucel. o Polyethylene Glycol - and "artificial" fiber commonly called Miralax or Glycolax.  It is helpful for people with gassy or bloated feelings with regular fiber o Flax Seed - a less gassy fiber than psyllium   No reading or other relaxing activity while on the toilet. If bowel movements take longer than 5 minutes, you are too constipated   AVOID CONSTIPATION.  High fiber and water intake usually takes care of this.  Sometimes a laxative is needed to stimulate more frequent bowel movements, but    Laxatives are not a good long-term solution as it can wear the colon out. o Osmotics (Milk of Magnesia, Fleets phosphosoda, Magnesium citrate, MiraLax, GoLytely) are safer than  o Stimulants (Senokot, Castor Oil, Dulcolax, Ex Lax)    o Do not take laxatives for more than 7days in a row.    IF SEVERELY CONSTIPATED, try a Bowel Retraining Program: o Do not use laxatives.  o Eat a diet high in roughage, such as bran cereals and leafy vegetables.  o Drink six (6) ounces of prune or apricot juice each morning.  o Eat two (2) large servings of stewed fruit each day.  o Take one (1) heaping tablespoon of a psyllium-based bulking  agent twice a day. Use sugar-free sweetener when possible to avoid excessive calories.  o Eat a normal breakfast.  o Set aside 15 minutes after breakfast to sit on the toilet, but do not strain to have a bowel movement.  o If you do not have a bowel movement by the third day, use an enema and repeat the above steps.    Controlling diarrhea o Switch to liquids and simpler foods for a few days to avoid stressing your intestines further. o Avoid dairy products (especially milk & ice cream) for a short time.  The intestines often can lose the ability to digest lactose when stressed. o Avoid foods that cause gassiness or bloating.  Typical foods include beans and other legumes, cabbage, broccoli, and dairy foods.  Every person has some sensitivity to other foods, so listen to our body and avoid those foods that trigger problems for you. o Adding fiber (Citrucel, Metamucil, psyllium, Miralax) gradually can help thicken stools by absorbing excess fluid and retrain the intestines to act more normally.  Slowly increase the dose over a few weeks.  Too much fiber too soon can backfire and cause cramping & bloating. o Probiotics (such as active yogurt, Align, etc) may help repopulate the intestines and colon with normal bacteria and calm down a sensitive digestive tract.  Most studies show it to be of mild help, though, and such products can be costly. o Medicines:   Bismuth subsalicylate (ex. Kayopectate, Pepto Bismol) every 30 minutes for up to 6 doses can help control diarrhea.  Avoid if pregnant.   Loperamide (Immodium) can slow down diarrhea.  Start with two tablets (4mg  total) first and then try one tablet every 6 hours.  Avoid if you are having fevers or severe pain.  If you are not better or start feeling worse, stop all medicines and call your doctor for advice o Call your doctor if you are getting worse or not better.  Sometimes further testing (cultures, endoscopy, X-ray studies, bloodwork, etc) may be  needed to help diagnose and treat the cause of the diarrhea. o   ABDOMINAL SURGERY: POST OP INSTRUCTIONS  1. DIET: Follow a light bland diet the first 24 hours after arrival home, such as soup, liquids, crackers, etc.  Be sure to include lots of fluids daily.  Avoid fast food or heavy meals as your are more likely to get nauseated.  Eat a low fat the next few days after surgery.   2. Take your usually prescribed home medications unless otherwise directed. 3. PAIN CONTROL: a. Pain is best controlled by a usual combination of three different methods TOGETHER: i. Ice/Heat ii. Over the counter pain medication iii. Prescription pain medication b. Most patients will experience some swelling and bruising around the incisions.  Ice packs or heating pads (30-60 minutes up to 6 times a day) will help. Use ice for the first few days to help decrease swelling and bruising, then switch to heat to  help relax tight/sore spots and speed recovery.  Some people prefer to use ice alone, heat alone, alternating between ice & heat.  Experiment to what works for you.  Swelling and bruising can take several weeks to resolve.   c. It is helpful to take an over-the-counter pain medication regularly for the first few weeks.  Choose one of the following that works best for you: i. Naproxen (Aleve, etc)  Two 220mg  tabs twice a day ii. Ibuprofen (Advil, etc) Three 200mg  tabs four times a day (every meal & bedtime) iii. Acetaminophen (Tylenol, etc) 500-650mg  four times a day (every meal & bedtime) d. A  prescription for pain medication (such as oxycodone, hydrocodone, etc) should be given to you upon discharge.  Take your pain medication as prescribed.  i. If you are having problems/concerns with the prescription medicine (does not control pain, nausea, vomiting, rash, itching, etc), please call us [phone] to see if we need to switch you to a different pain medicine that will work better for you and/or control your side  effect better. ii. If you need a refill on your pain medication, please contact your pharmacy.  They will contact our office to request authorization. Prescriptions will not be filled after 5 pm or on week-ends. 4. Avoid getting constipated.  Between the surgery and the pain medications, it is common to experience some constipation.  Increasing fluid intake and taking a fiber supplement (such as Metamucil, Citrucel, FiberCon, MiraLax, etc) 1-2 times a day regularly will usually help prevent this problem from occurring.  A mild laxative (prune juice, Milk of Magnesia, MiraLax, etc) should be taken according to package directions if there are no bowel movements after 48 hours.   5. Watch out for diarrhea.  If you have many loose bowel movements, simplify your diet to bland foods & liquids for a few days.  Stop any stool softeners and decrease your fiber supplement.  Switching to mild anti-diarrheal medications (Kayopectate, Pepto Bismol) can help.  If this worsens or does not improve, please call us. 6. Wash / shower every day.  You may shower over the incision / wound.  Avoid baths until the skin is fully healed.  Continue to shower over incision(s) after the dressing is off. 7. Remove your waterproof bandages 5 days after surgery.  You may leave the incision open to air.  You may replace a dressing/Band-Aid to cover the incision for comfort if you wish. 8. ACTIVITIES as tolerated:   a. You may resume regular (light) daily activities beginning the next day-such as daily self-care, walking, climbing stairs-gradually increasing activities as tolerated.  If you can walk 30 minutes without difficulty, it is safe to try more intense activity such as jogging, treadmill, bicycling, low-impact aerobics, swimming, etc. b. Save the most intensive and strenuous activity for last such as sit-ups, heavy lifting, contact sports, etc  Refrain from any heavy lifting or straining until you are off narcotics for pain control.    c. DO NOT PUSH THROUGH PAIN.  Let pain be your guide: If it hurts to do something, don't do it.  Pain is your body warning you to avoid that activity for another week until the pain goes down. d. You may drive when you are no longer taking prescription pain medication, you can comfortably wear a seatbelt, and you can safely maneuver your car and apply brakes. e. Dennis Bast may have sexual intercourse when it is comfortable.  9. FOLLOW UP in our office a. Please call  [facility] at [phone] to set up an appointment to see your surgeon in the office for a follow-up appointment approximately 1-2 weeks after your surgery. b. Make sure that you call for this appointment the day you arrive home to insure a convenient appointment time. 10. IF YOU HAVE DISABILITY OR FAMILY LEAVE FORMS, BRING THEM TO THE OFFICE FOR PROCESSING.  DO NOT GIVE THEM TO YOUR DOCTOR.   WHEN TO CALL us [phone]: 1. Poor pain control 2. Reactions / problems with new medications (rash/itching, nausea, etc)  3. Fever over 101.5 F (38.5 C) 4. Inability to urinate 5. Nausea and/or vomiting 6. Worsening swelling or bruising 7. Continued bleeding from incision. 8. Increased pain, redness, or drainage from the incision  The clinic staff is available to answer your questions during regular business hours (8:30am-5pm).  Please don't hesitate to call and ask to speak to one of our nurses for clinical concerns.   A surgeon from [facility] is always on call at the hospitals   If you have a medical emergency, go to the nearest emergency room or call 911.    [facility], [place], [place], [place], [place]  [address] ? MAIN: [phone] ? TOLL FREE: [phone] ? FAX [phone] [email or web address]

## 2013-06-23 NOTE — Progress Notes (Signed)
Subjective:     Patient ID: Leslie Mcmillan, male   DOB: 1970-08-16, 43 y.o.   MRN: [id number]  HPI  Note: This dictation was prepared with Dragon/digital dictation along with [facility] technology. Any transcriptional errors that result from this process are unintentional.       Christen Tetterton  [birth year] [id number]  Patient Care Team: No Pcp Per Patient as PCP - General (General Practice)  This patient is a 43 y.o.male who presents today for surgical evaluation at the request of Dr. Caralyn Guile.   Reason for visit: Abdominal pain with nausea.  Mass in small bowel mesentery  Pleasant young man struggling with intermittent crampy pain.  When I came in to the room he was bent over.  Then he seemed to improve a minute or 2 later.  He struggled with intermittent crampy abdominal pain.  Seems to be right-sided.  Can have sharp stabbing episodes.  He had a couple episodes of nausea and vomiting associated with it.  He was admitted.  He was seen by gastroenterology.  CT scan underwhelming.  Upper and lower endoscopy revealed some mild duodenal inflammation.  Not severe.  Started on proton pump inhibitor.  The patient wonders if that helped a little bit but not as much as he told.  He also had significant bleeding.  No strong bleeding source noted.  Because he has had more persistent crampy symptoms, he had continued workup.  Meckel's scan underwhelming.  However, concern for perhaps mesenteric mass noted.  MRI enterography confirms presence of a 3cm mass in the small bowel mesentery.  Concern given that on the same right side as his symptoms.  Surgical consultation recommended.  They have been some discussion of getting a small bowel capsule endoscopy but they held off on that since the mass seemed to be not associated with the lumen of the small intestine.  Patient is normally rather active but the spelled worsening cramping pain with this.  He does continue to smoke.  Normally has a bowel  movement every day.  No prior abdominal surgeries.  No personal nor family history of GI/colon cancer, inflammatory bowel disease, irritable bowel syndrome, allergy such as Celiac Sprue, dietary/dairy problems, colitis, ulcers nor gastritis.  No recent sick contacts/gastroenteritis.  No travel outside the country.  No changes in diet.  No dysphagia to solids or liquids.  No significant heartburn or reflux.  No hematochezia, hematemesis, coffee ground emesis.  No evidence of prior gastric/peptic ulceration.    Patient Active Problem List   Diagnosis Date Noted  . 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 06/23/2013  . Duodenitis without bleeding 05/09/2013  . Diverticulosis of colon without hemorrhage 05/09/2013  . Rectal bleeding 05/08/2013  . RLQ abdominal pain 05/08/2013    Past Medical History  Diagnosis Date  . Anxiety   . Hypertension   . Diverticulitis   . Gastroduodenitis     Past Surgical History  Procedure Laterality Date  . Tonsillectomy    . Colonoscopy N/A 05/09/2013    Procedure: COLONOSCOPY;  Surgeon: Irene Shipper, MD;  Location: [facility];  Service: Endoscopy;  Laterality: N/A;  . Esophagogastroduodenoscopy N/A 05/09/2013    Procedure: ESOPHAGOGASTRODUODENOSCOPY (EGD);  Surgeon: Irene Shipper, MD;  Location: [facility];  Service: Endoscopy;  Laterality: N/A;  possible egd depending on colon results    History   Social History  . Marital Status: Single    Spouse Name: N/A    Number of Children: 6  . Years of Education:  N/A   Occupational History  . 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 Smoking status: Current Every Day Smoker -- 0.50 packs/day  . Smokeless tobacco: Never Used  . Alcohol Use: Yes     Comment: 2-3 beers some days  . Drug Use: No  . Sexual Activity: Not on file   Other Topics Concern  . Not on file   Social History Narrative  . No narrative on file    Family History  Problem Relation Age of Onset   . Diabetes Paternal Grandmother   . Hypertension Paternal Grandmother   . Diabetes Paternal Uncle   . Diabetes Paternal Aunt   . Liver disease Paternal Uncle   . Hypertension Paternal Aunt   . Anuerysm Mother     brain    Current Outpatient Prescriptions  Medication Sig Dispense Refill  . HYDROcodone-acetaminophen (NORCO/VICODIN) 5-325 MG per tablet Take 1 tablet by mouth every 6 (six) hours as needed for moderate pain.  45 tablet  0  . hyoscyamine (LEVSIN SL) 0.125 MG SL tablet Place 1 tablet (0.125 mg total) under the tongue every 4 (four) hours as needed.  30 tablet  0  . pantoprazole (PROTONIX) 40 MG tablet Take 1 tablet (40 mg total) by mouth daily.  30 tablet  0   No current facility-administered medications for this visit.     No Known Allergies  BP 124/82  Pulse 80  Temp(Src) 97.2 F (36.2 C) (Temporal)  Resp 14  Ht 5\' 10"  (1.778 m)  Wt 217 lb (98.431 kg)  BMI 31.14 kg/m2  Dg Eye Foreign Body  05/30/2013   CLINICAL DATA:  Check for metal in the orbits.  Pre MRI screening.  EXAM: ORBITS FOR FOREIGN BODY - 2 VIEW  COMPARISON:  CT HEAD W/O CM dated 04/01/2013  FINDINGS: There is no evidence of metallic foreign body within the orbits. No significant bone abnormality identified.  IMPRESSION: No evidence of metallic foreign body within the orbits.   Electronically Signed   By: Markus Daft M.D.   On: 05/30/2013 18:54   Mr Consuela Mimes W/o W/cm  05/30/2013   CLINICAL DATA:  Abdominal pain.  Rectal bleeding.  Small bowel mass  EXAM: MR ABDOMEN AND PELVIS WITHOUT AND WITH CONTRAST (MR ENTEROGRAPHY)  TECHNIQUE: Multiplanar, multisequence MRI of the abdomen and pelvis was performed both before and during bolus administration of intravenous contrast. Negative oral contrast VoLumen was given.  CONTRAST:  52mL MULTIHANCE GADOBENATE DIMEGLUMINE 529 MG/ML IV SOLN  COMPARISON:  NM BOWEL IMG MECKELS dated 05/22/2013; CT ABD/PELVIS W CM dated 05/05/2013; CT ABD/PELVIS W CM dated 10/25/2010  FINDINGS:  MR ABDOMEN FINDINGS  3.0 x 2.0 by 2.2 cm mass observed in the right mesentery with close association with several mesenteric vessels, just below the level of the right kidney lower pole. Although there is some small bowel along the anterior tangential margin of this mass, the epicenter is clearly outside of the bowel and in the mesentery. The mass has similar to muscle signal intensity on precontrast T1 and T2 weighted images but demonstrates early and persistent enhancement after contrast administration.  Subtle fold thickening in portions of the proximal jejunum is suspected, for example on image 33 of series 3 where the folds appear somewhat irregular. Post-contrast the bowel is indistinct due to the degree of motion artifact.  No malrotation. No liver or pancreatic lesion observed on dynamic multi phase assessment. Spleen unremarkable,  as are the adrenal glands and kidneys.  MR PELVIS FINDINGS  No abnormal enhancement or wall thickening in the terminal ileum observed. No specific abnormality of the pelvic small bowel. Pelvic configuration, hypertrophic musculature, and full urinary bladder displace most of the small bowel loops out of the pelvis.  Please note to the lower rectum and anus were not included on the axial images.  No retroperitoneal adenopathy. No additional mesenteric mass observed.  IMPRESSION: 1. Diffusely enhancing 3.0 x 2.0 x 2.2 cm mass in the right new mesentery. There are closely associated mesenteric vessels but the lesion does not appear to invade the small bowel. As noted on the Meckel's scan report, top differential diagnostic considerations would be gastrointestinal stromal tumor or carcinoid tumor. Lymphoma is not completely excluded. 2. Several jejunal loops are somewhat featureless which could be due to fold thickening or effacement. No definite nodules to suggest Whipple's disease. Thickened folds can be seen in the setting of amyloidosis, and sprue, although other classic findings  of sprue are not present. The limited involved makes scleroderma unlikely. Currently no dilated loops are observed.   Electronically Signed   By: Sherryl Barters M.D.   On: 05/30/2013 21:43   Mr Kerry Fort Pelvis W/o W/cm  05/30/2013   CLINICAL DATA:  Abdominal pain.  Rectal bleeding.  Small bowel mass  EXAM: MR ABDOMEN AND PELVIS WITHOUT AND WITH CONTRAST (MR ENTEROGRAPHY)  TECHNIQUE: Multiplanar, multisequence MRI of the abdomen and pelvis was performed both before and during bolus administration of intravenous contrast. Negative oral contrast VoLumen was given.  CONTRAST:  59mL MULTIHANCE GADOBENATE DIMEGLUMINE 529 MG/ML IV SOLN  COMPARISON:  NM BOWEL IMG MECKELS dated 05/22/2013; CT ABD/PELVIS W CM dated 05/05/2013; CT ABD/PELVIS W CM dated 10/25/2010  FINDINGS: MR ABDOMEN FINDINGS  3.0 x 2.0 by 2.2 cm mass observed in the right mesentery with close association with several mesenteric vessels, just below the level of the right kidney lower pole. Although there is some small bowel along the anterior tangential margin of this mass, the epicenter is clearly outside of the bowel and in the mesentery. The mass has similar to muscle signal intensity on precontrast T1 and T2 weighted images but demonstrates early and persistent enhancement after contrast administration.  Subtle fold thickening in portions of the proximal jejunum is suspected, for example on image 33 of series 3 where the folds appear somewhat irregular. Post-contrast the bowel is indistinct due to the degree of motion artifact.  No malrotation. No liver or pancreatic lesion observed on dynamic multi phase assessment. Spleen unremarkable, as are the adrenal glands and kidneys.  MR PELVIS FINDINGS  No abnormal enhancement or wall thickening in the terminal ileum observed. No specific abnormality of the pelvic small bowel. Pelvic configuration, hypertrophic musculature, and full urinary bladder displace most of the small bowel loops out of the pelvis.  Please  note to the lower rectum and anus were not included on the axial images.  No retroperitoneal adenopathy. No additional mesenteric mass observed.  IMPRESSION: 1. Diffusely enhancing 3.0 x 2.0 x 2.2 cm mass in the right new mesentery. There are closely associated mesenteric vessels but the lesion does not appear to invade the small bowel. As noted on the Meckel's scan report, top differential diagnostic considerations would be gastrointestinal stromal tumor or carcinoid tumor. Lymphoma is not completely excluded. 2. Several jejunal loops are somewhat featureless which could be due to fold thickening or effacement. No definite nodules to suggest Whipple's disease. Thickened folds can be  seen in the setting of amyloidosis, and sprue, although other classic findings of sprue are not present. The limited involved makes scleroderma unlikely. Currently no dilated loops are observed.   Electronically Signed   By: Sherryl Barters M.D.   On: 05/30/2013 21:43     Review of Systems  Constitutional: Negative for fever, chills and diaphoresis.  HENT: Negative for ear discharge, facial swelling, mouth sores, nosebleeds, sore throat and trouble swallowing.   Eyes: Negative for photophobia, discharge and visual disturbance.  Respiratory: Negative for choking, chest tightness, shortness of breath and stridor.   Cardiovascular: Negative for chest pain and palpitations.  Gastrointestinal: Positive for nausea, vomiting, abdominal pain and blood in stool. Negative for diarrhea, constipation, abdominal distention, anal bleeding and rectal pain.  Endocrine: Negative for cold intolerance and heat intolerance.  Genitourinary: Negative for dysuria, urgency, difficulty urinating and testicular pain.  Musculoskeletal: Positive for back pain. Negative for arthralgias, gait problem, neck pain and neck stiffness.  Skin: Negative for color change, pallor, rash and wound.  Allergic/Immunologic: Negative for environmental allergies and  food allergies.  Neurological: Positive for weakness and headaches. Negative for dizziness, speech difficulty and numbness.  Hematological: Negative for adenopathy. Does not bruise/bleed easily.  Psychiatric/Behavioral: Negative for hallucinations, confusion and agitation.       Objective:   Physical Exam  Constitutional: He is oriented to person, place, and time. He appears well-developed and well-nourished. No distress.  HENT:  Head: Normocephalic.  Mouth/Throat: Oropharynx is clear and moist. No oropharyngeal exudate.  Eyes: Conjunctivae and EOM are normal. Pupils are equal, round, and reactive to light. No scleral icterus.  Neck: Normal range of motion. Neck supple. No tracheal deviation present.  Cardiovascular: Normal rate, regular rhythm and intact distal pulses.   Pulmonary/Chest: Effort normal and breath sounds normal. No respiratory distress.  Abdominal: Soft. He exhibits no distension. There is tenderness in the right upper quadrant and epigastric area. There is no rigidity, no rebound, no guarding, no tenderness at McBurney's point and negative Murphy's sign. No hernia. Hernia confirmed negative in the ventral area, confirmed negative in the right inguinal area and confirmed negative in the left inguinal area.  Musculoskeletal: Normal range of motion. He exhibits no tenderness.  Lymphadenopathy:    He has no cervical adenopathy.       Right: No inguinal adenopathy present.       Left: No inguinal adenopathy present.  Neurological: He is alert and oriented to person, place, and time. No cranial nerve deficit. He exhibits normal muscle tone. Coordination normal.  Skin: Skin is warm and dry. No rash noted. He is not diaphoretic. No erythema. No pallor.  Psychiatric: He has a normal mood and affect. His behavior is normal. Judgment and thought content normal.  Initially listless but then calmed down.  Consolable       Assessment:     Right-sided did small bowel mesenteric mass  with intermittent cramping nausea and bleeding.  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     Plan:     I spent some time reviewing his numerous studies and records and endoscopies.  He seemed crampy and listless at first but then called down fine.  Did not look toxic.  I believe he will require surgery to remove this abnormal mass in the small bowel mesentery.  Given the history of bleeding with an underwhelming upper and lower endoscopy, I still suspect he may have a carcinoid or some intraluminal mass with a spread to a small bowel  lymph node and that would explain all this.  I called gastroenterology.  Dr. Hilarie Fredrickson agrees that capsule endoscopy could be helpful to rule out a small bowel intraluminal mass not seen on prior studies related to this mesenteric mass.  At some point, he would benefit from resection of the small intestine containing this mass.  Plan to do lap assisted first to minimize large incision.  Make sure there is no evidence of adhesions or internal hernia.  The patient's interested in proceeding:  The anatomy & physiology of the digestive tract was discussed.  The pathophysiology of the SB was discussed.  Natural history risks without surgery was discussed.   I feel the risks of no intervention will lead to serious problems that outweigh the operative risks; therefore, I recommended a partial colectomy to remove the pathology.  Minimally invasive (Robotic/Laparoscopic) & open techniques were discussed.   Risks such as bleeding, infection, abscess, leak, reoperation, possible ostomy, hernia, heart attack, death, and other risks were discussed.  I noted a good likelihood this will help address the problem.   Goals of post-operative recovery were discussed as well.   Need for bowel regimen and healthy physical activity to optimize recovery noted as well. We will work to minimize complications.  Educational materials were given as well.  Questions were answered.  The patient expresses  understanding & wishes to proceed with surgery.  Once all small bowel capsule endoscopy was done, plan surgery.  We were to schedule it soon.  I discussed with the patient

## 2013-06-23 NOTE — Telephone Encounter (Signed)
Dr. Johney Maine called to discuss pt before probable small bowel exploration/resection for mass lesion as seen by MR-enterography After discussion, Dr. Johney Maine is requesting VCE before surgery to help characterize what was seen by cross-sectional imaging.  This is very reasonable Will arrange video capsule.  Please arrange for video capsule endoscopy for this patient

## 2013-06-24 MED ORDER — HYDROCODONE-ACETAMINOPHEN 5-325 MG PO TABS: 1.0000 | ORAL_TABLET | Freq: Four times a day (QID) | ORAL | Status: DC | PRN

## 2013-06-24 MED ORDER — HYOSCYAMINE SULFATE 0.125 MG SL SUBL: 0.1250 mg | SUBLINGUAL_TABLET | SUBLINGUAL | Status: DC | PRN

## 2013-06-24 NOTE — Telephone Encounter (Signed)
Patient is scheduled for capsule endo for Friday 06/27/13 8:00.  He will come pick up instructions and rx for hydrocodone.

## 2013-06-24 NOTE — Addendum Note (Signed)
Addended by: Marlon Pel on: 06/24/2013 08:46 AM   Modules accepted: Orders

## 2013-06-27 ENCOUNTER — Ambulatory Visit (INDEPENDENT_AMBULATORY_CARE_PROVIDER_SITE_OTHER): Payer: Self-pay | Admitting: Internal Medicine

## 2013-06-27 DIAGNOSIS — K922 Gastrointestinal hemorrhage, unspecified: Secondary | ICD-10-CM

## 2013-06-27 NOTE — Progress Notes (Signed)
Patient arrived for SBCE. Patient has prepped and has not eaten today. Reviewed instructions for today verbally and in written form. Patient swallowed capsule without difficulty. Lot [id number]. Expires 2016-05.

## 2013-06-30 ENCOUNTER — Telehealth (INDEPENDENT_AMBULATORY_CARE_PROVIDER_SITE_OTHER): Payer: Self-pay

## 2013-06-30 DIAGNOSIS — K6389 Other specified diseases of intestine: Secondary | ICD-10-CM

## 2013-06-30 MED ORDER — NEOMYCIN SULFATE 500 MG PO TABS: ORAL_TABLET | ORAL | Status: DC

## 2013-06-30 MED ORDER — METRONIDAZOLE 500 MG PO TABS: ORAL_TABLET | ORAL | Status: DC

## 2013-06-30 NOTE — Telephone Encounter (Signed)
Tried calling the pt twice now but not accepting calls at this time. I want to notify him that I was going to turn his surgical orders into surgery scheduling. I am going to mail the pt's bowel prep for upper/proximal colectomy colon prep instructions of Dr Clyda Greener along with the 2 abx's Flagyl and Neomycin. The pt will need to do this bowel prep the day before surgery.

## 2013-06-30 NOTE — Telephone Encounter (Signed)
Mailing Dr Johney Maine upper/proximal colectomy colon prep instructions along with 2 abx's Flagyl and Neomycin for the pt to do the day before surgery.

## 2013-07-01 ENCOUNTER — Telehealth (INDEPENDENT_AMBULATORY_CARE_PROVIDER_SITE_OTHER): Payer: Self-pay | Admitting: Surgery

## 2013-07-01 NOTE — Telephone Encounter (Signed)
07/01/13 cannot reach pt both his # and relative # are not working/not accepting calls / will send letter to call the office to schedule give working #s /orders will be on file /kh

## 2013-07-03 ENCOUNTER — Telehealth (INDEPENDENT_AMBULATORY_CARE_PROVIDER_SITE_OTHER): Payer: Self-pay | Admitting: Surgery

## 2013-07-03 NOTE — Telephone Encounter (Signed)
Got in touch with patient went over insurance benefits his deposit will be $479.00 cannot pay pt states he will just deal with it

## 2013-07-03 NOTE — Telephone Encounter (Signed)
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  I think that he needs surgery.  We will see if there is any financial support available.

## 2013-07-04 ENCOUNTER — Telehealth: Payer: Self-pay

## 2013-07-04 NOTE — Telephone Encounter (Signed)
Message copied by Algernon Huxley on Fri Jul 04, 2013  9:15 AM ------      Message from: Hulan Saas      Created: Fri Jun 27, 2013  3:40 PM       Call patient and see if he passed the capsule ------

## 2013-07-04 NOTE — Telephone Encounter (Signed)
Pt states he did pass the capsule. 

## 2013-07-07 ENCOUNTER — Encounter (INDEPENDENT_AMBULATORY_CARE_PROVIDER_SITE_OTHER): Payer: Self-pay | Admitting: Surgery

## 2013-07-07 ENCOUNTER — Telehealth (INDEPENDENT_AMBULATORY_CARE_PROVIDER_SITE_OTHER): Payer: Self-pay

## 2013-07-07 NOTE — Telephone Encounter (Signed)
Called pt to speak to him about getting scheduled for surgery. The pt told me that he did speak with Joellen Jersey about a surgery date. I advised pt that he needed to be scheduled for surgery b/c the type of tumor he has will keep getting larger if left a lone. I don't want the pt to get an emergency situation with not having surgery. I advised pt that we would deal with the deposit after surgery that he will need to speak with the business dept. Per Dr Johney Maine. The pt understands. I did go over the pt's colon prep instructions along with the 2 abx's. The pt understands.

## 2013-07-10 ENCOUNTER — Other Ambulatory Visit: Payer: Self-pay | Admitting: Internal Medicine

## 2013-07-11 ENCOUNTER — Telehealth: Payer: Self-pay | Admitting: Internal Medicine

## 2013-07-11 ENCOUNTER — Other Ambulatory Visit: Payer: Self-pay | Admitting: Gastroenterology

## 2013-07-11 MED ORDER — HYDROCODONE-ACETAMINOPHEN 5-325 MG PO TABS: 1.0000 | ORAL_TABLET | Freq: Four times a day (QID) | ORAL | Status: DC | PRN

## 2013-07-11 NOTE — Telephone Encounter (Signed)
LVm for pt to let him know Rx for Hydrocodone will be at our front desk for him to pick up. Also to call me back with any questions

## 2013-07-15 ENCOUNTER — Encounter (HOSPITAL_COMMUNITY): Payer: Self-pay | Admitting: Emergency Medicine

## 2013-07-15 ENCOUNTER — Emergency Department (HOSPITAL_COMMUNITY)
Admission: EM | Admit: 2013-07-15 | Discharge: 2013-07-16 | Disposition: A | Discharge status: Home / Self Care | Payer: 59 | Attending: Emergency Medicine | Admitting: Emergency Medicine

## 2013-07-15 DIAGNOSIS — Z8659 Personal history of other mental and behavioral disorders: Secondary | ICD-10-CM | POA: Insufficient documentation

## 2013-07-15 DIAGNOSIS — D3A00 Benign carcinoid tumor of unspecified site: Secondary | ICD-10-CM | POA: Insufficient documentation

## 2013-07-15 DIAGNOSIS — R519 Headache, unspecified: Secondary | ICD-10-CM

## 2013-07-15 DIAGNOSIS — Z792 Long term (current) use of antibiotics: Secondary | ICD-10-CM | POA: Insufficient documentation

## 2013-07-15 DIAGNOSIS — F17200 Nicotine dependence, unspecified, uncomplicated: Secondary | ICD-10-CM | POA: Insufficient documentation

## 2013-07-15 DIAGNOSIS — I10 Essential (primary) hypertension: Secondary | ICD-10-CM | POA: Insufficient documentation

## 2013-07-15 DIAGNOSIS — Z8719 Personal history of other diseases of the digestive system: Secondary | ICD-10-CM | POA: Insufficient documentation

## 2013-07-15 DIAGNOSIS — R51 Headache: Secondary | ICD-10-CM | POA: Insufficient documentation

## 2013-07-15 MED ORDER — METOCLOPRAMIDE HCL 5 MG/ML IJ SOLN
10.0000 mg | Freq: Once | INTRAMUSCULAR | Status: AC
  Administered 2013-07-16: 10 mg via INTRAVENOUS
  Filled 2013-07-15: qty 2

## 2013-07-15 MED ORDER — DIPHENHYDRAMINE HCL 50 MG/ML IJ SOLN
25.0000 mg | Freq: Once | INTRAMUSCULAR | Status: AC
  Administered 2013-07-16: 25 mg via INTRAVENOUS
  Filled 2013-07-15: qty 1

## 2013-07-15 MED ORDER — SODIUM CHLORIDE 0.9 % IV BOLUS (SEPSIS)
1000.0000 mL | Freq: Once | INTRAVENOUS | Status: AC
  Administered 2013-07-16: 1000 mL via INTRAVENOUS

## 2013-07-15 MED ORDER — KETOROLAC TROMETHAMINE 30 MG/ML IJ SOLN
30.0000 mg | Freq: Once | INTRAMUSCULAR | Status: AC
  Administered 2013-07-16: 30 mg via INTRAVENOUS
  Filled 2013-07-15: qty 1

## 2013-07-15 NOTE — ED Notes (Signed)
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 He has hydrocodone prescribed that he took today, but he was not relieved of the headache. Patient states that he is experiencing nausea, blurred vision and sensitivity to light and sound.

## 2013-07-16 MED ORDER — PROMETHAZINE HCL 25 MG PO TABS: 25.0000 mg | ORAL_TABLET | Freq: Four times a day (QID) | ORAL | Status: DC | PRN

## 2013-07-16 MED ORDER — OXYCODONE-ACETAMINOPHEN 5-325 MG PO TABS: 2.0000 | ORAL_TABLET | ORAL | Status: DC | PRN

## 2013-07-16 NOTE — ED Provider Notes (Signed)
CSN: [id number]     Arrival date & time 07/15/13  2132 History   First MD Initiated Contact with Patient 07/15/13 2303     Chief Complaint  Patient presents with  . Headache     (Consider location/radiation/quality/duration/timing/severity/associated sxs/prior Treatment) HPI.... right frontal/temporal headache since this morning. Patient has a history of carcinoid tumor and is scheduled for general surgery this summer by Dr. Johney Maine.   No brain tumor. CT head in January 2015 was normal. Complains of slight photophobia. No stiff neck, fever, neurological deficits. Severity is moderate.  Past Medical History  Diagnosis Date  . Anxiety   . Hypertension   . Diverticulitis   . Gastroduodenitis    Past Surgical History  Procedure Laterality Date  . Tonsillectomy    . Colonoscopy N/A 05/09/2013    Procedure: COLONOSCOPY;  Surgeon: Irene Shipper, MD;  Location: [facility];  Service: Endoscopy;  Laterality: N/A;  . Esophagogastroduodenoscopy N/A 05/09/2013    Procedure: ESOPHAGOGASTRODUODENOSCOPY (EGD);  Surgeon: Irene Shipper, MD;  Location: [facility];  Service: Endoscopy;  Laterality: N/A;  possible egd depending on colon results   Family History  Problem Relation Age of Onset  . Diabetes Paternal Grandmother   . Hypertension Paternal Grandmother   . Diabetes Paternal Uncle   . Diabetes Paternal Aunt   . Liver disease Paternal Uncle   . Hypertension Paternal Aunt   . Anuerysm Mother     brain   History  Substance Use Topics  . Smoking status: Current Every Day Smoker -- 0.50 packs/day  . Smokeless tobacco: Never Used  . Alcohol Use: Yes     Comment: 2-3 beers some days    Review of Systems  All other systems reviewed and are negative.     Allergies  Review of patient's allergies indicates no known allergies.  Home Medications   Prior to Admission medications   Medication Sig Start Date End Date Taking? Authorizing Provider  HYDROcodone-acetaminophen (NORCO/VICODIN)  5-325 MG per tablet Take 1 tablet by mouth every 6 (six) hours as needed for moderate pain. 07/11/13  Yes Willia Craze, NP  hyoscyamine (LEVSIN SL) 0.125 MG SL tablet Place 0.125 mg under the tongue every 4 (four) hours as needed for cramping. 06/24/13  Yes Jerene Bears, MD  pantoprazole (PROTONIX) 40 MG tablet Take 1 tablet (40 mg total) by mouth daily. 05/05/13  Yes Julianne Rice, MD  metroNIDAZOLE (FLAGYL) 500 MG tablet Take 2 tablets (1,000mg  total) by mouth as directed. Take 2 pills (1,000mg ) by mouth @ 1pm,3pm,and 10pm the day before your colorectal operation as discussed in [facility]. Call [phone] with questions. 06/30/13   Adin Hector, MD  neomycin (MYCIFRADIN) 500 MG tablet Take 2 tablets (1,000mg  total) by mouth as directed. Take 2 pills (1,000mg ) by mouth @ 1pm,3pm,and 10pm the day before your colorectal operation as discussed in [facility]. Call [phone] with questions. 06/30/13   Adin Hector, MD  oxyCODONE-acetaminophen (PERCOCET) 5-325 MG per tablet Take 2 tablets by mouth every 4 (four) hours as needed. 07/16/13   Nat Christen, MD  promethazine (PHENERGAN) 25 MG tablet Take 1 tablet (25 mg total) by mouth every 6 (six) hours as needed for nausea. 07/16/13   Nat Christen, MD   BP 139/82  Pulse 89  Temp(Src) 98.2 F (36.8 C) (Oral)  Resp 18  SpO2 100% Physical Exam  Nursing note and vitals reviewed. Constitutional: He is oriented to person, place, and time. He appears well-developed and well-nourished.  HENT:  Head: Normocephalic and atraumatic.  Eyes: Conjunctivae and EOM are normal. Pupils are equal, round, and reactive to light.  Neck: Normal range of motion. Neck supple.  Cardiovascular: Normal rate, regular rhythm and normal heart sounds.   Pulmonary/Chest: Effort normal and breath sounds normal.  Abdominal: Soft. Bowel sounds are normal.  Musculoskeletal: Normal range of motion.  Neurological: He is alert and oriented to person, place, and time.  Skin: Skin is warm and dry.   Psychiatric: He has a normal mood and affect. His behavior is normal.    ED Course  Procedures (including critical care time) Labs Review Labs Reviewed - No data to display  Imaging Review No results found.   EKG Interpretation None      MDM   Final diagnoses:  Headache    No neurological deficits at discharge.   Patient feels much better after IV fluids, IV Toradol, IV Reglan, IV Benadryl.   Discharge medications Percocet, Phenergan 25 mg    Nat Christen, MD 07/16/13 [phone]

## 2013-07-16 NOTE — Discharge Instructions (Signed)
Medication for pain and nausea. Try to get a primary care Dr.   [name] guide given.    Emergency Department Resource Guide 1) Find a Doctor and Pay Out of Pocket Although you won't have to find out who is covered by your insurance plan, it is a good idea to ask around and get recommendations. You will then need to call the office and see if the doctor you have chosen will accept you as a new patient and what types of options they offer for patients who are self-pay. Some doctors offer discounts or will set up payment plans for their patients who do not have insurance, but you will need to ask so you aren't surprised when you get to your appointment.  2) Contact Your Local Health Department Not all health departments have doctors that can see patients for sick visits, but many do, so it is worth a call to see if yours does. If you don't know where your local health department is, you can check in your phone book. The CDC also has a tool to help you locate your state's health department, and many state websites also have listings of all of their local health departments.  3) Find a [facility] If your illness is not likely to be very severe or complicated, you may want to try a walk in clinic. These are popping up all over the country in pharmacies, drugstores, and shopping centers. They're usually staffed by nurse practitioners or physician assistants that have been trained to treat common illnesses and complaints. They're usually fairly quick and inexpensive. However, if you have serious medical issues or chronic medical problems, these are probably not your best option.  No Primary Care Doctor: - Call Health Connect at  [phone] - they can help you locate a primary care doctor that  accepts your insurance, provides certain services, etc. - Physician Referral Service- [phone]  Chronic Pain Problems: Organization         Address  Phone   Notes  [facility]  [phone] Patients need to be referred by their primary care doctor.   Medication Assistance: Organization         Address  Phone   Notes  [facility] Medication [facility] [address] [place], [place] [address] [phone] --Must be a resident of [facility] -- Must have NO insurance coverage whatsoever (no Medicaid/ Medicare, etc.) -- The pt. MUST have a primary care doctor that directs their care regularly and follows them in the community   [facility]  [phone]   [employer]  [phone]    Agencies that provide inexpensive medical care: Organization         Address  Phone   Notes  [facility]  [phone]   [facility]    [phone]   [facility] [place],  [phone]   [place] [address], [place] [phone]   Planned Parenthood    [phone]   [facility]    [phone]   [place] and [place] [address], [place] Phone:  [phone], Fax:  [phone] Hours of Operation:  9 am - 6 pm, M-F.  Also accepts Medicaid/Medicare and self-pay.  [facility] for [place] [address], [address], [place] Phone: [phone], Fax: [phone]. Hours  of Operation:  8:30 am - 5:30 pm, M-F.  Also accepts Medicaid and self-pay.  [facility] [place] [address] [place] Phone: [phone]   [place], [place], [place] [phone], Ext. [phone] Mondays & Thursdays: 7-9 AM.  First 15 patients are seen on a first come, first serve basis.    [place] Providers:  Organization         Address  Phone   Notes  [facility] [address], [place] [phone] Also accepts self-pay patients.  [facility] [id number] [place], [place]   [phone]   [place], [address], [place] [phone]   [facility] [address], [place] [phone]   Lucianne Lei [address], [place]   [phone] Only accepts [place] Access [place] patients after they have their name applied to their card.   Self-Pay (no insurance) in [facility]:  Organization         Address  Phone   Notes  Sickle Cell Patients, [facility] [place] [phone]   [facility] [place] [phone]   [facility] [place]  [place], [place], [place] [phone]   [facility]/Dr. Osei-Bonsu  [address], [place] or [address], [place] [phone] Phone number for both [place] and [place] locations is the same.  [facility] [address], [place] [phone]   [facility] [address] [place] or [address] [phone]   [facility] [address] [place] [phone], phone; [phone], fax Sees patients 1st and 3rd Saturday of every month.  Must not qualify for public or private insurance (i.e. Medicaid, Medicare, [place] Health Choice, Veterans' Benefits)  Household income should be no more than 200% of the poverty level The clinic cannot treat you if you are pregnant or think you are pregnant  Sexually transmitted diseases are not treated at the clinic.    Dental Care: Organization         Address  Phone  Notes  [facility] Department of [facility] [place] [phone] Accepts children up to age 24 who are enrolled in [place] or [place]; pregnant women with a Medicaid card; and children who have applied for Medicaid or [place] Health Choice, but  were declined, whose parents can pay a reduced fee at time of service.  [facility] Department of [facility]  [address], [place] [phone] Accepts children up to age 28 who are enrolled in [place] or [place]; pregnant women with a Medicaid card; and children who have applied for Medicaid or [place] Health Choice, but were declined, whose parents can pay a reduced fee at time of service.  [facility] Access PRO[facility]  [place] [phone] Patients are seen by appointment only. Walk-ins are not accepted. [place] will see patients 65 years of age and older. Monday - Tuesday (8am-5pm) Most Wednesdays (8:30-5pm) $30 per visit, cash only  [facility] Adult Dental Access PROGRAM  [address], [place] [phone] Patients are  seen by appointment only. Walk-ins are not accepted. [place] will see patients 90 years of age and older. One Wednesday Evening (Monthly: Volunteer Based).  $30 per visit, cash only  [facility]act  [phone] for adults; Children under age 24, call Graduate Pediatric Dentistry at [phone]. Children aged 49-14, please call [phone] to request a pediatric application.  Dental services are provided in all areas of dental care including fillings, crowns and bridges, complete and partial dentures, implants, gum treatment, root canals, and extractions. Preventive care is also provided. Treatment is provided to both adults and children. Patients are selected via a lottery and there is often a waiting list.   [facility] [address], [place]  [phone] [email or web address]   [facility] [address] [place], [place] [phone], Ext. [phone] Second and Fourth Thursday of each month, opens at 6:30 AM; Clinic ends at 9 AM.  Patients are seen on a first-come first-served basis, and a limited number are seen during each clinic.    [facility]  [address] Hillard Danker [place], [place] [phone]   Eligibility Requirements You must have lived in [place], [place], or [place] counties for at least the last three months.   You cannot be eligible for state or federal sponsored [employer], including [employer], [place], or [employer].   You generally cannot be eligible for healthcare insurance through your employer.    How to apply: Eligibility screenings are held every Tuesday and Wednesday afternoon from 1:00 pm until 4:00 pm. You do not need an appointment for the interview!  [facility] [address] [place], [place]   [place]  [place] Department  Pablo Pena  [phone]    Behavioral Health Resources in the Community: Intensive Outpatient Programs Organization         Address  Phone  Notes  [place]y [place]. [address], [place], [place] [phone]   [facility] Outpatient [address] [place], [place]   ADS: Alcohol [facility] [address] [place], [place]   [place] [address],  [place], [place] or [phone]   Substance Abuse Resources Organization         Address  Phone  Notes  Alcohol and Drug Services  [phone]   [place]  [phone]   The [place]   Chinita Pester  [phone]   Residential & Outpatient Substance Abuse Program  [phone]   Psychological Services Organization         Address  Phone  Notes  [facility]  [place]  [phone]   [place] [address] [place] or [phone]    Mobile Crisis Teams Organization         Address  Phone  Notes  [facility], Mobile Crisis Care  Unit  [phone]   [facility]  [address] [place], [place]   [place] Levels [address] [place] [phone]    Self-Help/Support Groups Organization         Address  Phone             Notes  [place]. of [place] - variety of support groups  [place] Call for  more information  Narcotics Anonymous (NA), [facility] [address], [employer] [place]  2 meetings at this location   Residential Facilities manager         Address  Phone  Notes  [facility] [place],    [place]  [phone]   [facility]  [address], [place] [id number], [place], [place]   [place], [place] [phone] Admissions: 8am-3pm M-F  Incentives Substance [place] [address]    [place], [place] [phone]   [facility] [address] [place], [place], [place]   [facility] [address]  [place], [place]   [facility] - Intensive Outpatient [address] Kristeen Mans [address], [place], [place]   [facility] ([place].) [place].,  [place], [place] [phone] or [phone]   [facility] (RTS) [address], [place], [place] Accepts Medicaid  [facility] [address],  [place] [phone] Substance Abuse/Addiction Treatment   [facility] Organization         Address  Phone  Notes  [facility]  [phone]   Domenic Schwab, PhD [address] Arlis [place], [place]   [phone] or [phone]   [place], [place] [phone]   [facility] [address], [place], [place] [phone] Insurance/Medicaid/sponsorship through [facility] and Families [address], [address] [place]                                     [place], [place] [phone] [place] [address], [place] [phone]    Dr. Adele Schilder  [phone]   [facility] [place] Dept. 1) [address], [place] 2) [place] 3)  [place] [address], [place] [phone]  [phone]   [place] [phone] or [phone] (After Hours)

## 2013-07-29 ENCOUNTER — Encounter (HOSPITAL_COMMUNITY): Payer: Self-pay | Admitting: Pharmacy Technician

## 2013-07-29 ENCOUNTER — Inpatient Hospital Stay: Admit: 2013-07-29 | Payer: Self-pay | Admitting: Surgery

## 2013-07-29 SURGERY — EXCISION, SMALL INTESTINE, LAPAROSCOPIC
Anesthesia: General

## 2013-08-01 ENCOUNTER — Other Ambulatory Visit (HOSPITAL_COMMUNITY): Payer: Self-pay | Admitting: Anesthesiology

## 2013-08-01 ENCOUNTER — Encounter: Payer: Self-pay | Admitting: Internal Medicine

## 2013-08-01 NOTE — Patient Instructions (Addendum)
[place]  08/01/2013   Your procedure is scheduled on: [place] June 9th, 2015  Report to [facility] Main Entrance and follow signs to  [place] at 530 AM.  Call this number if you have problems the morning of surgery [phone]   Remember:  Do not eat food or drink liquids :After Midnight.     Take these medicines the morning of surgery with A SIP OF WATER: no meds to take                               You may not have any metal on your body including hair pins and piercings  Do not wear jewelry, make-up, lotions, powders, or deodorant.   Men may shave face and neck.  Do not bring valuables to the hospital. [place].  Contacts, dentures or bridgework may not be worn into surgery.  Leave suitcase in the car. After surgery it may be brought to your room.  For patients admitted to the hospital, checkout time is 11:00 AM the day of discharge.   Patients discharged the day of surgery will not be allowed to drive home.  Name and phone number of your driver:  Special Instructions: N/A ________________________________________________________________________  [facility] - Preparing for Surgery Before surgery, you can play an important role.  Because skin is not sterile, your skin needs to be as free of germs as possible.  You can reduce the number of germs on your skin by washing with CHG (chlorahexidine gluconate) soap before surgery.  CHG is an antiseptic cleaner which kills germs and bonds with the skin to continue killing germs even after washing. Please DO NOT use if you have an allergy to CHG or antibacterial soaps.  If your skin becomes reddened/irritated stop using the CHG and inform your nurse when you arrive at Short Stay. Do not shave (including legs and underarms) for at least 48 hours prior to the first CHG shower.  You may shave your face/neck. Please follow these instructions carefully:  1.  Shower with CHG Soap  the night before surgery and the  morning of Surgery.  2.  If you choose to wash your hair, wash your hair first as usual with your  normal  shampoo.  3.  After you shampoo, rinse your hair and body thoroughly to remove the  shampoo.                           4.  Use CHG as you would any other liquid soap.  You can apply chg directly  to the skin and wash                       Gently with a scrungie or clean washcloth.  5.  Apply the CHG Soap to your body ONLY FROM THE NECK DOWN.   Do not use on face/ open                           Wound or open sores. Avoid contact with eyes, ears mouth and genitals (private parts).                       Wash face,  Genitals (private parts) with your normal soap.  6.  Wash thoroughly, paying special attention to the area where your surgery  will be performed.  7.  Thoroughly rinse your body with warm water from the neck down.  8.  DO NOT shower/wash with your normal soap after using and rinsing off  the CHG Soap.                9.  Pat yourself dry with a clean towel.            10.  Wear clean pajamas.            11.  Place clean sheets on your bed the night of your first shower and do not  sleep with pets. Day of Surgery : Do not apply any lotions/deodorants the morning of surgery.  Please wear clean clothes to the hospital/surgery center.  FAILURE TO FOLLOW THESE INSTRUCTIONS MAY RESULT IN THE CANCELLATION OF YOUR SURGERY PATIENT SIGNATURE_________________________________  NURSE SIGNATURE__________________________________  ________________________________________________________________________

## 2013-08-03 DIAGNOSIS — K921 Melena: Secondary | ICD-10-CM

## 2013-08-03 HISTORY — DX: Melena: K92.1

## 2013-08-04 ENCOUNTER — Encounter (HOSPITAL_COMMUNITY)
Admission: RE | Admit: 2013-08-04 | Discharge: 2013-08-04 | Disposition: A | Discharge status: Home / Self Care | Payer: 59 | Source: Ambulatory Visit | Attending: Surgery | Admitting: Surgery

## 2013-08-04 ENCOUNTER — Encounter (HOSPITAL_COMMUNITY): Payer: Self-pay

## 2013-08-04 ENCOUNTER — Ambulatory Visit (HOSPITAL_COMMUNITY)
Admission: RE | Admit: 2013-08-04 | Discharge: 2013-08-04 | Disposition: A | Discharge status: Home / Self Care | Payer: 59 | Source: Ambulatory Visit | Attending: Anesthesiology | Admitting: Anesthesiology

## 2013-08-04 HISTORY — DX: Melena: K92.1

## 2013-08-04 HISTORY — DX: Gastro-esophageal reflux disease without esophagitis: K21.9

## 2013-08-04 HISTORY — DX: Major depressive disorder, single episode, unspecified: F32.9

## 2013-08-04 HISTORY — DX: Depression, unspecified: F32.A

## 2013-08-04 LAB — CBC
HEMATOCRIT: 40.5 % (ref 39.0–52.0)
Hemoglobin: 13.5 g/dL (ref 13.0–17.0)
MCH: 27.4 pg (ref 26.0–34.0)
MCHC: 33.3 g/dL (ref 30.0–36.0)
MCV: 82.2 fL (ref 78.0–100.0)
Platelets: 262 10*3/uL (ref 150–400)
RBC: 4.93 MIL/uL (ref 4.22–5.81)
RDW: 13.4 % (ref 11.5–15.5)
WBC: 6.5 10*3/uL (ref 4.0–10.5)

## 2013-08-04 LAB — BASIC METABOLIC PANEL
BUN: 13 mg/dL (ref 6–23)
CHLORIDE: 102 meq/L (ref 96–112)
CO2: 28 mEq/L (ref 19–32)
CREATININE: 1.37 mg/dL — AB (ref 0.50–1.35)
Calcium: 9.7 mg/dL (ref 8.4–10.5)
GFR calc Af Amer: 72 mL/min — ABNORMAL LOW (ref >90)
GFR calc non Af Amer: 62 mL/min — ABNORMAL LOW (ref >90)
Glucose, Bld: 116 mg/dL — ABNORMAL HIGH (ref 70–99)
Potassium: 4.3 mEq/L (ref 3.7–5.3)
Sodium: 141 mEq/L (ref 137–147)

## 2013-08-04 MED ORDER — HYDROCODONE-ACETAMINOPHEN 5-325 MG PO TABS: 1.0000 | ORAL_TABLET | Freq: Four times a day (QID) | ORAL | Status: DC | PRN

## 2013-08-05 ENCOUNTER — Ambulatory Visit: Payer: 59 | Admitting: Family Medicine

## 2013-08-12 ENCOUNTER — Encounter (HOSPITAL_COMMUNITY)
Admission: RE | Disposition: A | Discharge status: Home / Self Care | Payer: Self-pay | Source: Ambulatory Visit | Attending: Surgery

## 2013-08-12 ENCOUNTER — Inpatient Hospital Stay (HOSPITAL_COMMUNITY)
Admission: RE | Admit: 2013-08-12 | Discharge: 2013-08-16 | DRG: 331 | Disposition: A | Discharge status: Home / Self Care | Payer: 59 | Source: Ambulatory Visit | Attending: Surgery | Admitting: Surgery

## 2013-08-12 ENCOUNTER — Inpatient Hospital Stay (HOSPITAL_COMMUNITY): Payer: 59 | Admitting: Certified Registered Nurse Anesthetist

## 2013-08-12 ENCOUNTER — Encounter (HOSPITAL_COMMUNITY): Payer: 59 | Admitting: Certified Registered Nurse Anesthetist

## 2013-08-12 ENCOUNTER — Encounter (HOSPITAL_COMMUNITY): Payer: Self-pay | Admitting: *Deleted

## 2013-08-12 DIAGNOSIS — K389 Disease of appendix, unspecified: Secondary | ICD-10-CM | POA: Diagnosis present

## 2013-08-12 DIAGNOSIS — Z8249 Family history of ischemic heart disease and other diseases of the circulatory system: Secondary | ICD-10-CM

## 2013-08-12 DIAGNOSIS — R599 Enlarged lymph nodes, unspecified: Secondary | ICD-10-CM | POA: Diagnosis present

## 2013-08-12 DIAGNOSIS — I10 Essential (primary) hypertension: Secondary | ICD-10-CM | POA: Diagnosis present

## 2013-08-12 DIAGNOSIS — F17200 Nicotine dependence, unspecified, uncomplicated: Secondary | ICD-10-CM | POA: Diagnosis present

## 2013-08-12 DIAGNOSIS — K6389 Other specified diseases of intestine: Secondary | ICD-10-CM | POA: Diagnosis present

## 2013-08-12 DIAGNOSIS — A630 Anogenital (venereal) warts: Secondary | ICD-10-CM | POA: Diagnosis present

## 2013-08-12 DIAGNOSIS — C7A019 Malignant carcinoid tumor of the small intestine, unspecified portion: Secondary | ICD-10-CM

## 2013-08-12 DIAGNOSIS — D3A012 Benign carcinoid tumor of the ileum: Secondary | ICD-10-CM | POA: Diagnosis present

## 2013-08-12 HISTORY — PX: LAPAROSCOPIC APPENDECTOMY: SHX408

## 2013-08-12 HISTORY — PX: BOWEL RESECTION: SHX1257

## 2013-08-12 LAB — CBC
HCT: 40.8 % (ref 39.0–52.0)
Hemoglobin: 13.5 g/dL (ref 13.0–17.0)
MCH: 27.2 pg (ref 26.0–34.0)
MCHC: 33.1 g/dL (ref 30.0–36.0)
MCV: 82.1 fL (ref 78.0–100.0)
PLATELETS: 227 10*3/uL (ref 150–400)
RBC: 4.97 MIL/uL (ref 4.22–5.81)
RDW: 13.8 % (ref 11.5–15.5)
WBC: 14.4 10*3/uL — AB (ref 4.0–10.5)

## 2013-08-12 LAB — TYPE AND SCREEN
ABO/RH(D): O POS
Antibody Screen: NEGATIVE

## 2013-08-12 LAB — CREATININE, SERUM
CREATININE: 1.28 mg/dL (ref 0.50–1.35)
GFR, EST AFRICAN AMERICAN: 78 mL/min — AB (ref >90)
GFR, EST NON AFRICAN AMERICAN: 68 mL/min — AB (ref >90)

## 2013-08-12 LAB — ABO/RH: ABO/RH(D): O POS

## 2013-08-12 SURGERY — APPENDECTOMY, LAPAROSCOPIC
Anesthesia: General | Site: Abdomen

## 2013-08-12 MED ORDER — EPHEDRINE SULFATE 50 MG/ML IJ SOLN
INTRAMUSCULAR | Status: AC
  Filled 2013-08-12: qty 1

## 2013-08-12 MED ORDER — LACTATED RINGERS IV SOLN
INTRAVENOUS | Status: DC | PRN
  Administered 2013-08-12 (×4): via INTRAVENOUS

## 2013-08-12 MED ORDER — MAGIC MOUTHWASH
15.0000 mL | Freq: Four times a day (QID) | ORAL | Status: DC | PRN
  Filled 2013-08-12: qty 15

## 2013-08-12 MED ORDER — DIPHENHYDRAMINE HCL 12.5 MG/5ML PO ELIX: 12.5000 mg | ORAL_SOLUTION | Freq: Four times a day (QID) | ORAL | Status: DC | PRN

## 2013-08-12 MED ORDER — NEOSTIGMINE METHYLSULFATE 10 MG/10ML IV SOLN
INTRAVENOUS | Status: AC
  Filled 2013-08-12: qty 1

## 2013-08-12 MED ORDER — MIDAZOLAM HCL 2 MG/2ML IJ SOLN
INTRAMUSCULAR | Status: AC
  Filled 2013-08-12: qty 2

## 2013-08-12 MED ORDER — SACCHAROMYCES BOULARDII 250 MG PO CAPS
250.0000 mg | ORAL_CAPSULE | Freq: Two times a day (BID) | ORAL | Status: DC
  Administered 2013-08-12 – 2013-08-16 (×8): 250 mg via ORAL
  Filled 2013-08-12 (×9): qty 1

## 2013-08-12 MED ORDER — ESMOLOL HCL 10 MG/ML IV SOLN
INTRAVENOUS | Status: AC
  Filled 2013-08-12: qty 10

## 2013-08-12 MED ORDER — FENTANYL CITRATE 0.05 MG/ML IJ SOLN
INTRAMUSCULAR | Status: AC
  Filled 2013-08-12: qty 5

## 2013-08-12 MED ORDER — LABETALOL HCL 5 MG/ML IV SOLN
INTRAVENOUS | Status: DC | PRN
  Administered 2013-08-12: 5 mg via INTRAVENOUS
  Administered 2013-08-12: 2.5 mg via INTRAVENOUS
  Administered 2013-08-12: 5 mg via INTRAVENOUS

## 2013-08-12 MED ORDER — BUPIVACAINE-EPINEPHRINE (PF) 0.25% -1:200000 IJ SOLN
INTRAMUSCULAR | Status: AC
  Filled 2013-08-12: qty 30

## 2013-08-12 MED ORDER — GLYCOPYRROLATE 0.2 MG/ML IJ SOLN
INTRAMUSCULAR | Status: DC | PRN
  Administered 2013-08-12: 0.6 mg via INTRAVENOUS

## 2013-08-12 MED ORDER — PROMETHAZINE HCL 25 MG/ML IJ SOLN
6.2500 mg | Freq: Four times a day (QID) | INTRAMUSCULAR | Status: DC | PRN
  Administered 2013-08-14: 12.5 mg via INTRAVENOUS
  Filled 2013-08-12 (×2): qty 1

## 2013-08-12 MED ORDER — NEOSTIGMINE METHYLSULFATE 10 MG/10ML IV SOLN
INTRAVENOUS | Status: DC | PRN
  Administered 2013-08-12: 5 mg via INTRAVENOUS

## 2013-08-12 MED ORDER — LIDOCAINE HCL (CARDIAC) 20 MG/ML IV SOLN
INTRAVENOUS | Status: DC | PRN
  Administered 2013-08-12: 100 mg via INTRAVENOUS

## 2013-08-12 MED ORDER — ALVIMOPAN 12 MG PO CAPS
12.0000 mg | ORAL_CAPSULE | Freq: Once | ORAL | Status: AC
  Administered 2013-08-12: 12 mg via ORAL
  Filled 2013-08-12: qty 1

## 2013-08-12 MED ORDER — SODIUM CHLORIDE 0.9 % IV SOLN
INTRAVENOUS | Status: DC
  Filled 2013-08-12: qty 6

## 2013-08-12 MED ORDER — ALVIMOPAN 12 MG PO CAPS
12.0000 mg | ORAL_CAPSULE | Freq: Two times a day (BID) | ORAL | Status: DC
  Administered 2013-08-13 – 2013-08-14 (×3): 12 mg via ORAL
  Filled 2013-08-12 (×4): qty 1

## 2013-08-12 MED ORDER — ACETAMINOPHEN 500 MG PO TABS
1000.0000 mg | ORAL_TABLET | Freq: Three times a day (TID) | ORAL | Status: DC
  Administered 2013-08-12 – 2013-08-16 (×12): 1000 mg via ORAL
  Filled 2013-08-12 (×15): qty 2

## 2013-08-12 MED ORDER — BUPIVACAINE-EPINEPHRINE 0.25% -1:200000 IJ SOLN
INTRAMUSCULAR | Status: DC | PRN
  Administered 2013-08-12: 60 mL

## 2013-08-12 MED ORDER — ESMOLOL HCL 10 MG/ML IV SOLN
INTRAVENOUS | Status: DC | PRN
  Administered 2013-08-12 (×2): 20 mg via INTRAVENOUS

## 2013-08-12 MED ORDER — HYDROMORPHONE HCL PF 1 MG/ML IJ SOLN
0.2500 mg | INTRAMUSCULAR | Status: DC | PRN
  Administered 2013-08-12 (×4): 0.25 mg via INTRAVENOUS
  Administered 2013-08-12 (×2): 0.5 mg via INTRAVENOUS

## 2013-08-12 MED ORDER — KETAMINE HCL 10 MG/ML IJ SOLN
INTRAMUSCULAR | Status: AC
  Filled 2013-08-12: qty 1

## 2013-08-12 MED ORDER — FENTANYL CITRATE 0.05 MG/ML IJ SOLN
INTRAMUSCULAR | Status: DC | PRN
Start: 2013-08-12 — End: 2013-08-12
  Administered 2013-08-12 (×7): 50 ug via INTRAVENOUS
  Administered 2013-08-12: 100 ug via INTRAVENOUS
  Administered 2013-08-12: 50 ug via INTRAVENOUS

## 2013-08-12 MED ORDER — BUPIVACAINE 0.25 % ON-Q PUMP DUAL CATH 300 ML
300.0000 mL | INJECTION | Status: DC
  Filled 2013-08-12: qty 300

## 2013-08-12 MED ORDER — LABETALOL HCL 5 MG/ML IV SOLN
INTRAVENOUS | Status: AC
  Filled 2013-08-12: qty 4

## 2013-08-12 MED ORDER — LACTATED RINGERS IR SOLN
Status: DC | PRN
  Administered 2013-08-12: 1000 mL

## 2013-08-12 MED ORDER — GLYCOPYRROLATE 0.2 MG/ML IJ SOLN
INTRAMUSCULAR | Status: AC
  Filled 2013-08-12: qty 3

## 2013-08-12 MED ORDER — KETOROLAC TROMETHAMINE 30 MG/ML IJ SOLN
INTRAMUSCULAR | Status: AC
  Filled 2013-08-12: qty 1

## 2013-08-12 MED ORDER — ONDANSETRON HCL 4 MG/2ML IJ SOLN
INTRAMUSCULAR | Status: DC | PRN
  Administered 2013-08-12: 4 mg via INTRAVENOUS

## 2013-08-12 MED ORDER — LIP MEDEX EX OINT
1.0000 | TOPICAL_OINTMENT | Freq: Two times a day (BID) | CUTANEOUS | Status: DC
Start: 2013-08-12 — End: 2013-08-16
  Administered 2013-08-12 – 2013-08-16 (×9): 1 via TOPICAL
  Filled 2013-08-12 (×4): qty 7

## 2013-08-12 MED ORDER — PROPOFOL 10 MG/ML IV BOLUS
INTRAVENOUS | Status: AC
Start: 2013-08-12 — End: 2013-08-12
  Filled 2013-08-12: qty 20

## 2013-08-12 MED ORDER — DEXTROSE 5 % IV SOLN
2.0000 g | Freq: Two times a day (BID) | INTRAVENOUS | Status: AC
  Administered 2013-08-12: 2 g via INTRAVENOUS
  Filled 2013-08-12: qty 2

## 2013-08-12 MED ORDER — HYDROMORPHONE HCL PF 1 MG/ML IJ SOLN
0.5000 mg | INTRAMUSCULAR | Status: DC | PRN
  Administered 2013-08-12 (×6): 1 mg via INTRAVENOUS
  Administered 2013-08-13: 2 mg via INTRAVENOUS
  Administered 2013-08-13 (×7): 1 mg via INTRAVENOUS
  Administered 2013-08-13: 2 mg via INTRAVENOUS
  Administered 2013-08-13 – 2013-08-14 (×2): 1 mg via INTRAVENOUS
  Administered 2013-08-14: 2 mg via INTRAVENOUS
  Administered 2013-08-14 (×2): 1 mg via INTRAVENOUS
  Administered 2013-08-14 (×2): 2 mg via INTRAVENOUS
  Administered 2013-08-14: 1 mg via INTRAVENOUS
  Administered 2013-08-14: 2 mg via INTRAVENOUS
  Administered 2013-08-14 – 2013-08-16 (×6): 1 mg via INTRAVENOUS
  Filled 2013-08-12 (×5): qty 1
  Filled 2013-08-12 (×2): qty 2
  Filled 2013-08-12: qty 1
  Filled 2013-08-12: qty 2
  Filled 2013-08-12: qty 1
  Filled 2013-08-12: qty 2
  Filled 2013-08-12 (×8): qty 1
  Filled 2013-08-12: qty 2
  Filled 2013-08-12 (×11): qty 1

## 2013-08-12 MED ORDER — LIDOCAINE HCL (CARDIAC) 20 MG/ML IV SOLN
INTRAVENOUS | Status: AC
  Filled 2013-08-12: qty 5

## 2013-08-12 MED ORDER — ONDANSETRON HCL 4 MG/2ML IJ SOLN
INTRAMUSCULAR | Status: AC
  Filled 2013-08-12: qty 2

## 2013-08-12 MED ORDER — BIOTENE DRY MOUTH MT LIQD
15.0000 mL | Freq: Two times a day (BID) | OROMUCOSAL | Status: DC
  Administered 2013-08-13 – 2013-08-16 (×3): 15 mL via OROMUCOSAL

## 2013-08-12 MED ORDER — CEFOTETAN DISODIUM-DEXTROSE 2-2.08 GM-% IV SOLR
INTRAVENOUS | Status: AC
  Filled 2013-08-12: qty 50

## 2013-08-12 MED ORDER — KETOROLAC TROMETHAMINE 30 MG/ML IJ SOLN
INTRAMUSCULAR | Status: DC | PRN
  Administered 2013-08-12: 30 mg via INTRAVENOUS

## 2013-08-12 MED ORDER — PNEUMOCOCCAL VAC POLYVALENT 25 MCG/0.5ML IJ INJ
0.5000 mL | INJECTION | INTRAMUSCULAR | Status: AC
  Administered 2013-08-13: 0.5 mL via INTRAMUSCULAR
  Filled 2013-08-12 (×2): qty 0.5

## 2013-08-12 MED ORDER — SODIUM CHLORIDE 0.9 % IJ SOLN
INTRAMUSCULAR | Status: AC
  Filled 2013-08-12: qty 10

## 2013-08-12 MED ORDER — CEFOTETAN DISODIUM-DEXTROSE 2-2.08 GM-% IV SOLR
2.0000 g | INTRAVENOUS | Status: AC
  Administered 2013-08-12: 2 g via INTRAVENOUS

## 2013-08-12 MED ORDER — METOPROLOL TARTRATE 1 MG/ML IV SOLN
5.0000 mg | Freq: Four times a day (QID) | INTRAVENOUS | Status: DC | PRN
  Filled 2013-08-12: qty 5

## 2013-08-12 MED ORDER — HEPARIN SODIUM (PORCINE) 5000 UNIT/ML IJ SOLN
5000.0000 [IU] | Freq: Three times a day (TID) | INTRAMUSCULAR | Status: DC
  Administered 2013-08-13 – 2013-08-16 (×10): 5000 [IU] via SUBCUTANEOUS
  Filled 2013-08-12 (×13): qty 1

## 2013-08-12 MED ORDER — HYDROMORPHONE HCL PF 1 MG/ML IJ SOLN
INTRAMUSCULAR | Status: AC
  Filled 2013-08-12: qty 1

## 2013-08-12 MED ORDER — LACTATED RINGERS IV BOLUS (SEPSIS): 1000.0000 mL | Freq: Three times a day (TID) | INTRAVENOUS | Status: AC | PRN

## 2013-08-12 MED ORDER — 0.9 % SODIUM CHLORIDE (POUR BTL) OPTIME
TOPICAL | Status: DC | PRN
  Administered 2013-08-12: 2000 mL

## 2013-08-12 MED ORDER — LACTATED RINGERS IV SOLN
INTRAVENOUS | Status: DC
  Administered 2013-08-12: 12:00:00 via INTRAVENOUS

## 2013-08-12 MED ORDER — OXYCODONE HCL 5 MG PO TABS: 5.0000 mg | ORAL_TABLET | ORAL | Status: DC | PRN

## 2013-08-12 MED ORDER — KCL IN DEXTROSE-NACL 40-5-0.9 MEQ/L-%-% IV SOLN
INTRAVENOUS | Status: DC
  Administered 2013-08-12 – 2013-08-13 (×2): via INTRAVENOUS
  Filled 2013-08-12 (×3): qty 1000

## 2013-08-12 MED ORDER — CHLORHEXIDINE GLUCONATE 0.12 % MT SOLN
15.0000 mL | Freq: Two times a day (BID) | OROMUCOSAL | Status: DC
  Administered 2013-08-13 – 2013-08-16 (×4): 15 mL via OROMUCOSAL
  Filled 2013-08-12 (×10): qty 15

## 2013-08-12 MED ORDER — ALUM & MAG HYDROXIDE-SIMETH 200-200-20 MG/5ML PO SUSP
30.0000 mL | Freq: Four times a day (QID) | ORAL | Status: DC | PRN
  Administered 2013-08-14: 30 mL via ORAL
  Filled 2013-08-12: qty 30

## 2013-08-12 MED ORDER — DEXAMETHASONE SODIUM PHOSPHATE 10 MG/ML IJ SOLN
INTRAMUSCULAR | Status: AC
  Filled 2013-08-12: qty 1

## 2013-08-12 MED ORDER — KETAMINE HCL 10 MG/ML IJ SOLN
INTRAMUSCULAR | Status: DC | PRN
  Administered 2013-08-12: 30 mg via INTRAVENOUS

## 2013-08-12 MED ORDER — SUCCINYLCHOLINE CHLORIDE 20 MG/ML IJ SOLN
INTRAMUSCULAR | Status: DC | PRN
  Administered 2013-08-12: 100 mg via INTRAVENOUS

## 2013-08-12 MED ORDER — ROCURONIUM BROMIDE 100 MG/10ML IV SOLN
INTRAVENOUS | Status: AC
  Filled 2013-08-12: qty 1

## 2013-08-12 MED ORDER — PROPOFOL 10 MG/ML IV BOLUS
INTRAVENOUS | Status: DC | PRN
  Administered 2013-08-12: 200 mg via INTRAVENOUS

## 2013-08-12 MED ORDER — ROCURONIUM BROMIDE 100 MG/10ML IV SOLN
INTRAVENOUS | Status: DC | PRN
  Administered 2013-08-12: 10 mg via INTRAVENOUS
  Administered 2013-08-12: 50 mg via INTRAVENOUS
  Administered 2013-08-12 (×2): 10 mg via INTRAVENOUS

## 2013-08-12 MED ORDER — DIPHENHYDRAMINE HCL 50 MG/ML IJ SOLN: 12.5000 mg | Freq: Four times a day (QID) | INTRAMUSCULAR | Status: DC | PRN

## 2013-08-12 MED ORDER — MIDAZOLAM HCL 5 MG/5ML IJ SOLN
INTRAMUSCULAR | Status: DC | PRN
  Administered 2013-08-12: 2 mg via INTRAVENOUS

## 2013-08-12 MED ORDER — MIDAZOLAM HCL 2 MG/2ML IJ SOLN
1.0000 mg | INTRAMUSCULAR | Status: DC | PRN
  Administered 2013-08-12 (×2): 0.5 mg via INTRAVENOUS

## 2013-08-12 MED ORDER — DEXAMETHASONE SODIUM PHOSPHATE 10 MG/ML IJ SOLN
INTRAMUSCULAR | Status: DC | PRN
  Administered 2013-08-12: 10 mg via INTRAVENOUS

## 2013-08-12 MED ORDER — BUPIVACAINE ON-Q PAIN PUMP (FOR ORDER SET NO CHG)
INJECTION | Status: AC
  Filled 2013-08-12: qty 1

## 2013-08-12 SURGICAL SUPPLY — 75 items
APPLIER CLIP 5 13 M/L LIGAMAX5 (MISCELLANEOUS)
APPLIER CLIP ROT 10 11.4 M/L (STAPLE)
BLADE EXTENDED COATED 6.5IN (ELECTRODE) ×4 IMPLANT
BLADE HEX COATED 2.75 (ELECTRODE) ×4 IMPLANT
BLADE SURG SZ10 CARB STEEL (BLADE) ×4 IMPLANT
CABLE HIGH FREQUENCY MONO STRZ (ELECTRODE) ×4 IMPLANT
CANISTER SUCTION 2500CC (MISCELLANEOUS) IMPLANT
CATH KIT ON Q 7.5IN SLV (PAIN MANAGEMENT) IMPLANT
CELLS DAT CNTRL 66122 CELL SVR (MISCELLANEOUS) ×2 IMPLANT
CLIP APPLIE 5 13 M/L LIGAMAX5 (MISCELLANEOUS) IMPLANT
CLIP APPLIE ROT 10 11.4 M/L (STAPLE) IMPLANT
COUNTER NEEDLE 20 DBL MAG RED (NEEDLE) ×4 IMPLANT
COVER MAYO STAND STRL (DRAPES) ×8 IMPLANT
DECANTER SPIKE VIAL GLASS SM (MISCELLANEOUS) IMPLANT
DRAIN CHANNEL 19F RND (DRAIN) IMPLANT
DRAPE LAPAROSCOPIC ABDOMINAL (DRAPES) ×4 IMPLANT
DRAPE LG THREE QUARTER DISP (DRAPES) ×8 IMPLANT
DRAPE UTILITY XL STRL (DRAPES) ×12 IMPLANT
DRAPE WARM FLUID 44X44 (DRAPE) ×8 IMPLANT
DRSG OPSITE POSTOP 4X10 (GAUZE/BANDAGES/DRESSINGS) IMPLANT
DRSG OPSITE POSTOP 4X6 (GAUZE/BANDAGES/DRESSINGS) IMPLANT
DRSG OPSITE POSTOP 4X8 (GAUZE/BANDAGES/DRESSINGS) ×4 IMPLANT
DRSG TEGADERM 2-3/8X2-3/4 SM (GAUZE/BANDAGES/DRESSINGS) ×8 IMPLANT
DRSG TEGADERM 4X4.75 (GAUZE/BANDAGES/DRESSINGS) ×4 IMPLANT
ELECT REM PT RETURN 9FT ADLT (ELECTROSURGICAL) ×4
ELECTRODE REM PT RTRN 9FT ADLT (ELECTROSURGICAL) ×2 IMPLANT
ENDOLOOP SUT PDS II  0 18 (SUTURE)
ENDOLOOP SUT PDS II 0 18 (SUTURE) IMPLANT
GLOVE ECLIPSE 8.0 STRL XLNG CF (GLOVE) ×8 IMPLANT
GLOVE INDICATOR 8.0 STRL GRN (GLOVE) ×8 IMPLANT
GOWN STRL REUS W/TWL XL LVL3 (GOWN DISPOSABLE) ×16 IMPLANT
KIT BASIN OR (CUSTOM PROCEDURE TRAY) ×8 IMPLANT
LEGGING LITHOTOMY PAIR STRL (DRAPES) ×8 IMPLANT
LUBRICANT JELLY K Y 4OZ (MISCELLANEOUS) IMPLANT
PENCIL BUTTON HOLSTER BLD 10FT (ELECTRODE) ×4 IMPLANT
RELOAD PROXIMATE 75MM BLUE (ENDOMECHANICALS) ×4 IMPLANT
RTRCTR WOUND ALEXIS 18CM MED (MISCELLANEOUS) ×4
SCISSORS LAP 5X35 DISP (ENDOMECHANICALS) ×4 IMPLANT
SEALER TISSUE G2 CVD JAW 35 (ENDOMECHANICALS) IMPLANT
SEALER TISSUE G2 CVD JAW 45CM (ENDOMECHANICALS)
SEALER TISSUE G2 STRG ARTC 35C (ENDOMECHANICALS) ×4 IMPLANT
SET IRRIG TUBING LAPAROSCOPIC (IRRIGATION / IRRIGATOR) ×4 IMPLANT
SLEEVE XCEL OPT CAN 5 100 (ENDOMECHANICALS) ×8 IMPLANT
SPONGE GAUZE 4X4 12PLY (GAUZE/BANDAGES/DRESSINGS) ×4 IMPLANT
SPONGE LAP 18X18 X RAY DECT (DISPOSABLE) ×4 IMPLANT
STAPLER GUN LINEAR PROX 60 (STAPLE) ×4 IMPLANT
STAPLER PROXIMATE 75MM BLUE (STAPLE) ×4 IMPLANT
STAPLER VISISTAT 35W (STAPLE) ×4 IMPLANT
SUCTION POOLE TIP (SUCTIONS) ×4 IMPLANT
SUT MNCRL AB 4-0 PS2 18 (SUTURE) ×4 IMPLANT
SUT PDS AB 1 CTX 36 (SUTURE) ×8 IMPLANT
SUT PDS AB 1 TP1 96 (SUTURE) IMPLANT
SUT PROLENE 0 CT 2 (SUTURE) IMPLANT
SUT SILK 2 0 (SUTURE) ×2
SUT SILK 2 0 SH CR/8 (SUTURE) ×4 IMPLANT
SUT SILK 2-0 18XBRD TIE 12 (SUTURE) ×2 IMPLANT
SUT SILK 3 0 (SUTURE) ×2
SUT SILK 3 0 SH CR/8 (SUTURE) ×8 IMPLANT
SUT SILK 3-0 18XBRD TIE 12 (SUTURE) ×2 IMPLANT
SYR BULB IRRIGATION 50ML (SYRINGE) ×4 IMPLANT
SYS LAPSCP GELPORT 120MM (MISCELLANEOUS)
SYSTEM LAPSCP GELPORT 120MM (MISCELLANEOUS) IMPLANT
TAPE UMBILICAL COTTON 1/8X30 (MISCELLANEOUS) ×4 IMPLANT
TOWEL OR 17X26 10 PK STRL BLUE (TOWEL DISPOSABLE) ×8 IMPLANT
TOWEL OR NON WOVEN STRL DISP B (DISPOSABLE) ×8 IMPLANT
TRAY FOLEY CATH 14FRSI W/METER (CATHETERS) IMPLANT
TRAY FOLEY CATH 16FR SILVER (SET/KITS/TRAYS/PACK) ×4 IMPLANT
TRAY LAP CHOLE (CUSTOM PROCEDURE TRAY) ×4 IMPLANT
TROCAR BLADELESS OPT 5 100 (ENDOMECHANICALS) ×4 IMPLANT
TROCAR XCEL NON-BLD 11X100MML (ENDOMECHANICALS) ×4 IMPLANT
TUBING CONNECTING 10 (TUBING) IMPLANT
TUBING CONNECTING 10' (TUBING)
TUBING FILTER THERMOFLATOR (ELECTROSURGICAL) ×4 IMPLANT
TUNNELER SHEATH ON-Q 16GX12 DP (PAIN MANAGEMENT) ×4 IMPLANT
YANKAUER SUCT BULB TIP 10FT TU (MISCELLANEOUS) ×8 IMPLANT

## 2013-08-12 NOTE — Anesthesia Postprocedure Evaluation (Signed)
  Anesthesia Post-op Note  Patient: Leslie Mcmillan  Procedure(s) Performed: Procedure(s) (LRB): LAPAROSCOPIC ASSISTED APPENDECTOMY (N/A) SMALL BOWEL ILEAL RESECTION (N/A)  Patient Location: PACU  Anesthesia Type: General  Level of Consciousness: awake and alert   Airway and Oxygen Therapy: Patient Spontanous Breathing  Post-op Pain: mild  Post-op Assessment: Post-op Vital signs reviewed, Patient's Cardiovascular Status Stable, Respiratory Function Stable, Patent Airway and No signs of Nausea or vomiting  Last Vitals:  Filed Vitals:   08/12/13 1130  BP: 122/78  Pulse: 59  Temp: 36.5 C  Resp: 9    Post-op Vital Signs: stable   Complications: No apparent anesthesia complications

## 2013-08-12 NOTE — Discharge Instructions (Signed)
ABDOMINAL SURGERY: POST OP INSTRUCTIONS ° °1. DIET: Follow a light bland diet the first 24 hours after arrival home, such as soup, liquids, crackers, etc.  Be sure to include lots of fluids daily.  Avoid fast food or heavy meals as your are more likely to get nauseated.  Eat a low fat the next few days after surgery.   °2. Take your usually prescribed home medications unless otherwise directed. °3. PAIN CONTROL: °a. Pain is best controlled by a usual combination of three different methods TOGETHER: °i. Ice/Heat °ii. Over the counter pain medication °iii. Prescription pain medication °b. Most patients will experience some swelling and bruising around the incisions.  Ice packs or heating pads (30-60 minutes up to 6 times a day) will help. Use ice for the first few days to help decrease swelling and bruising, then switch to heat to help relax tight/sore spots and speed recovery.  Some people prefer to use ice alone, heat alone, alternating between ice & heat.  Experiment to what works for you.  Swelling and bruising can take several weeks to resolve.   °c. It is helpful to take an over-the-counter pain medication regularly for the first few weeks.  Choose one of the following that works best for you: °i. Naproxen (Aleve, etc)  Two 220mg tabs twice a day °ii. Ibuprofen (Advil, etc) Three 200mg tabs four times a day (every meal & bedtime) °iii. Acetaminophen (Tylenol, etc) 500-650mg four times a day (every meal & bedtime) °d. A  prescription for pain medication (such as oxycodone, hydrocodone, etc) should be given to you upon discharge.  Take your pain medication as prescribed.  °i. If you are having problems/concerns with the prescription medicine (does not control pain, nausea, vomiting, rash, itching, etc), please call us [phone] to see if we need to switch you to a different pain medicine that will work better for you and/or control your side effect better. °ii. If you need a refill on your pain medication,  please contact your pharmacy.  They will contact our office to request authorization. Prescriptions will not be filled after 5 pm or on week-ends. °4. Avoid getting constipated.  Between the surgery and the pain medications, it is common to experience some constipation.  Increasing fluid intake and taking a fiber supplement (such as Metamucil, Citrucel, FiberCon, MiraLax, etc) 1-2 times a day regularly will usually help prevent this problem from occurring.  A mild laxative (prune juice, Milk of Magnesia, MiraLax, etc) should be taken according to package directions if there are no bowel movements after 48 hours.   °5. Watch out for diarrhea.  If you have many loose bowel movements, simplify your diet to bland foods & liquids for a few days.  Stop any stool softeners and decrease your fiber supplement.  Switching to mild anti-diarrheal medications (Kayopectate, Pepto Bismol) can help.  If this worsens or does not improve, please call us. °6. Wash / shower every day.  You may shower over the incision / wound.  Avoid baths until the skin is fully healed.  Continue to shower over incision(s) after the dressing is off. °7. Remove your waterproof bandages 5 days after surgery.  You may leave the incision open to air.  You may replace a dressing/Band-Aid to cover the incision for comfort if you wish. °8. ACTIVITIES as tolerated:   °a. You may resume regular (light) daily activities beginning the next day--such as daily self-care, walking, climbing stairs--gradually increasing activities as tolerated.  If you can   walk 30 minutes without difficulty, it is safe to try more intense activity such as jogging, treadmill, bicycling, low-impact aerobics, swimming, etc. °b. Save the most intensive and strenuous activity for last such as sit-ups, heavy lifting, contact sports, etc  Refrain from any heavy lifting or straining until you are off narcotics for pain control.   °c. DO NOT PUSH THROUGH PAIN.  Let pain be your guide: If it  hurts to do something, don't do it.  Pain is your body warning you to avoid that activity for another week until the pain goes down. °d. You may drive when you are no longer taking prescription pain medication, you can comfortably wear a seatbelt, and you can safely maneuver your car and apply brakes. °e. You may have sexual intercourse when it is comfortable.  °9. FOLLOW UP in our office °a. Please call [facility] at [phone] to set up an appointment to see your surgeon in the office for a follow-up appointment approximately 1-2 weeks after your surgery. °b. Make sure that you call for this appointment the day you arrive home to insure a convenient appointment time. °10. IF YOU HAVE DISABILITY OR FAMILY LEAVE FORMS, BRING THEM TO THE OFFICE FOR PROCESSING.  DO NOT GIVE THEM TO YOUR DOCTOR. ° ° °WHEN TO CALL US [phone]: °1. Poor pain control °2. Reactions / problems with new medications (rash/itching, nausea, etc)  °3. Fever over 101.5 F (38.5 C) °4. Inability to urinate °5. Nausea and/or vomiting °6. Worsening swelling or bruising °7. Continued bleeding from incision. °8. Increased pain, redness, or drainage from the incision ° °The clinic staff is available to answer your questions during regular business hours (8:30am-5pm).  Please don’t hesitate to call and ask to speak to one of our nurses for clinical concerns.   A surgeon from [facility] is always on call at the hospitals °  °If you have a medical emergency, go to the nearest emergency room or call 911. °  ° °[facility] °[address], [address], [place], [place]  [address] ? °MAIN: [phone] ? TOLL FREE: [phone] ? °FAX [phone] °[email or web address] ° °GETTING TO GOOD BOWEL HEALTH. °Irregular bowel habits such as constipation and diarrhea can lead to many problems over time.  Having one soft bowel movement a day is the most important way to prevent further problems.  The anorectal canal  is designed to handle stretching and feces to safely manage our ability to get rid of solid waste (feces, poop, stool) out of our body.  BUT, hard constipated stools can act like ripping concrete bricks and diarrhea can be a burning fire to this very sensitive area of our body, causing inflamed hemorrhoids, anal fissures, increasing risk is perirectal abscesses, abdominal pain/bloating, an making irritable bowel worse.     °The goal: ONE SOFT BOWEL MOVEMENT A DAY!  To have soft, regular bowel movements:  °  Drink at least 8 tall glasses of water a day.   °  Take plenty of fiber.  Fiber is the undigested part of plant food that passes into the colon, acting s “natures broom” to encourage bowel motility and movement.  Fiber can absorb and hold large amounts of water. This results in a larger, bulkier stool, which is soft and easier to pass. Work gradually over several weeks up to 6 servings a day of fiber (25g a day even more if needed) in the form of: °o Vegetables -- Root (potatoes, carrots, turnips),   leafy green (lettuce, salad greens, celery, spinach), or cooked high residue (cabbage, broccoli, etc) °o Fruit -- Fresh (unpeeled skin & pulp), Dried (prunes, apricots, cherries, etc ),  or stewed ( applesauce)  °o Whole grain breads, pasta, etc (whole wheat)  °o Bran cereals  °  Bulking Agents -- This type of water-retaining fiber generally is easily obtained each day by one of the following:  °o Psyllium bran -- The psyllium plant is remarkable because its ground seeds can retain so much water. This product is available as Metamucil, Konsyl, Effersyllium, Per Diem Fiber, or the less expensive generic preparation in drug and health food stores. Although labeled a laxative, it really is not a laxative.  °o Methylcellulose -- This is another fiber derived from wood which also retains water. It is available as Citrucel. °o Polyethylene Glycol - and “artificial” fiber commonly called Miralax or Glycolax.  It is helpful  for people with gassy or bloated feelings with regular fiber °o Flax Seed - a less gassy fiber than psyllium °  No reading or other relaxing activity while on the toilet. If bowel movements take longer than 5 minutes, you are too constipated °  AVOID CONSTIPATION.  High fiber and water intake usually takes care of this.  Sometimes a laxative is needed to stimulate more frequent bowel movements, but  °  Laxatives are not a good long-term solution as it can wear the colon out. °o Osmotics (Milk of Magnesia, Fleets phosphosoda, Magnesium citrate, MiraLax, GoLytely) are safer than  °o Stimulants (Senokot, Castor Oil, Dulcolax, Ex Lax)    °o Do not take laxatives for more than 7days in a row. °   IF SEVERELY CONSTIPATED, try a Bowel Retraining Program: °o Do not use laxatives.  °o Eat a diet high in roughage, such as bran cereals and leafy vegetables.  °o Drink six (6) ounces of prune or apricot juice each morning.  °o Eat two (2) large servings of stewed fruit each day.  °o Take one (1) heaping tablespoon of a psyllium-based bulking agent twice a day. Use sugar-free sweetener when possible to avoid excessive calories.  °o Eat a normal breakfast.  °o Set aside 15 minutes after breakfast to sit on the toilet, but do not strain to have a bowel movement.  °o If you do not have a bowel movement by the third day, use an enema and repeat the above steps.  °  Controlling diarrhea °o Switch to liquids and simpler foods for a few days to avoid stressing your intestines further. °o Avoid dairy products (especially milk & ice cream) for a short time.  The intestines often can lose the ability to digest lactose when stressed. °o Avoid foods that cause gassiness or bloating.  Typical foods include beans and other legumes, cabbage, broccoli, and dairy foods.  Every person has some sensitivity to other foods, so listen to our body and avoid those foods that trigger problems for you. °o Adding fiber (Citrucel, Metamucil, psyllium,  Miralax) gradually can help thicken stools by absorbing excess fluid and retrain the intestines to act more normally.  Slowly increase the dose over a few weeks.  Too much fiber too soon can backfire and cause cramping & bloating. °o Probiotics (such as active yogurt, Align, etc) may help repopulate the intestines and colon with normal bacteria and calm down a sensitive digestive tract.  Most studies show it to be of mild help, though, and such products can be costly. °o Medicines: °  Bismuth   subsalicylate (ex. Kayopectate, Pepto Bismol) every 30 minutes for up to 6 doses can help control diarrhea.  Avoid if pregnant.   Loperamide (Immodium) can slow down diarrhea.  Start with two tablets (4mg  total) first and then try one tablet every 6 hours.  Avoid if you are having fevers or severe pain.  If you are not better or start feeling worse, stop all medicines and call your doctor for advice o Call your doctor if you are getting worse or not better.  Sometimes further testing (cultures, endoscopy, X-ray studies, bloodwork, etc) may be needed to help diagnose and treat the cause of the diarrhea.  Managing Pain  Pain after surgery or related to activity is often due to strain/injury to muscle, tendon, nerves and/or incisions.  This pain is usually short-term and will improve in a few months.   Many people find it helpful to do the following things TOGETHER to help speed the process of healing and to get back to regular activity more quickly:  1. Avoid heavy physical activity a.  no lifting greater than 20 pounds b. Do not push through the pain.  Listen to your body and avoid positions and maneuvers than reproduce the pain c. Walking is okay as tolerated, but go slowly and stop when getting sore.  d. Remember: If it hurts to do it, then dont do it! 2. Take Anti-inflammatory medication  a. Take with food/snack around the clock for 1-2 weeks i. This helps the muscle and nerve tissues become less irritable  and calm down faster b. Choose ONE of the following over-the-counter medications: i. Naproxen 220mg  tabs (ex. Aleve) 1-2 pills twice a day  ii. Ibuprofen 200mg  tabs (ex. Advil, Motrin) 3-4 pills with every meal and just before bedtime iii. Acetaminophen 500mg  tabs (Tylenol) 1-2 pills with every meal and just before bedtime 3. Use a Heating pad or Ice/Cold Pack a. 4-6 times a day b. May use warm bath/hottub  or showers 4. Try Gentle Massage and/or Stretching  a. at the area of pain many times a day b. stop if you feel pain - do not overdo it  Try these steps together to help you body heal faster and avoid making things get worse.  Doing just one of these things may not be enough.    If you are not getting better after two weeks or are noticing you are getting worse, contact our office for further advice; we may need to re-evaluate you & see what other things we can do to help.

## 2013-08-12 NOTE — Op Note (Signed)
08/12/2013  10:06 AM  PATIENT:  Leslie Mcmillan  43 y.o. 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  Appendix  2.  Ileum with mesenteric mass  DISPOSITION OF SPECIMEN:  PATHOLOGY  COUNTS:  YES  PLAN OF CARE: Admit to inpatient   PATIENT DISPOSITION:  PACU - hemodynamically stable.  INDICATION:    Pleasant gentleman with episode of abdominal pain and rectal bleeding.  EGD and colonoscopy negative.  MRI enterography noted mass in right mesentery.  Probable small bowel.  Small bowel capsule endoscopy not proving indeed definite intraluminal mass.  A surgical consultation requested.  I recommended segmental resection:  The anatomy & physiology of the digestive tract was discussed.  The pathophysiology was discussed.  Natural history risks without surgery was discussed.   I worked to give an overview of the disease and the frequent need to have multispecialty involvement.  I feel the risks of no intervention will lead to serious problems that outweigh the operative risks; therefore, I recommended a partial colectomy to remove the pathology.  Laparoscopic & open techniques were  discussed.   Risks such as bleeding, infection, abscess, leak, reoperation, possible ostomy, hernia, heart attack, death, and other risks were discussed.  I noted a good likelihood this will help address the problem.   Goals of post-operative recovery were discussed as well.  We will work to minimize complications.  An educational handout on the pathology was given as well.  Questions were answered.    The patient expresses understanding & wishes to proceed with surgery.  OR FINDINGS:   Patient had Aathickened mass in the proximal ileal mesentery 5 x 4 cm.  Corresponding intraluminal small ileal mass as well and proximal ileum.  Patient had abnormal fibrous tip of appendix.  Frozen section not consistent with carcinoid or GIST.  Appears benign.  No obvious metastatic disease on visceral parietal peritoneum or liver.  It is an ileoileal anastomosis that rests in the RLQ  DESCRIPTION:   Informed consent was confirmed.  The patient underwent general anaesthesia without difficulty.  The patient was positioned with arms tucked & secured appropriately.  VTE prevention in place.  Of note patient had obvious condylomata of the shaft of the penis noticed with placement of the Foley catheter.  We left them alone. The patient's abdomen was clipped, prepped, & draped in a sterile fashion.  Surgical timeout confirmed our plan.  The patient was positioned in reverse Trendelenburg.  Abdominal entry was gained using optical entry technique in the  left upper abdomen.  Entry was clean.  I induced carbon dioxide insufflation.  Camera inspection revealed no injury.  Extra ports were carefully placed under direct laparoscopic visualization.  I mobilized & reflected  the greater omentum in the upper abdomen.  I was able to elevate the proximal colon to isolate the ileocolonic pedicle.  We could find the appendix.  The tip of the appendix and what rubbery firm and thickened in the distal 1 cm.  Did not see an obvious  mass in the ileocolonic mesentery.  I ran the small bowel proximally and noted a mass in the ileal mesentery.  Rather at the proximal base.  Could not find any definite mass corresponding in the ileum or small bowel.  We ran the bowel proximally to the ligament of Treitz.  No internal adhesions or hernias.  Because of concern of possible need for ileocolonic resection, I mobilized the proximal colon and a lateral to medial fashion.  I elevated the cecum and scored the ileal mesentery and proximal colon off the retroperitoneum.  I was able to free it off in a primarily lateral to medial fashion off the right retroperitneum and mobilize most of the hepatic flexure  I placed a wound protector through a supraumbilical midline 6cm incision in the supraumbilical region, later extended around the umbilicus.  With that, I was able to eviserate the small bowel and proximal colon.  Isolated the appendix.  Attempt was calcified but moderately rubbery firm and smooth.  The proceeded with appendectomy.  We transected the appendix at the base using a GIA-75 stapler.  We took the mesentery with clamps and silk ligature.  We sent that off for frozen specimen.  We ran the small bowel by hand again and I could feel a subtle mass in the proximal ileum.  This corresponded with the obvious mass in the ileal mesentery that was rather proximal.  Could feel nor see any other abnormalities.  Frozen section of the appendix revealed no obvious carcinoid or gastrointestinal stromal tumor or cancer.  Looked like fibrous obliteration and benign scarring.  We therefore proceeded with ileal resection.  We scored the proximal ileal mesentery around the mesenteric mass at its base and transected it off the small bowel mesentery using primarily bipolar EnSeal system.  We then extended more radially to the ileum to include the endoluminal ileal mass & have good margins.  We assured hemostasis.   I did a side-to-side stapled anastomosis of ileum  to ileum using a 61mm GIA stapler.  We then transected the specimen off (including the common bowel defect) using a TX-60 stapler.  This resulted in resecting about 1 foot of proximal ileum.  Remaining small intestine looked healthy and viable.  I closed off the common ileal mesenteric defect using interrupted silk stitches to avoid any internal hernias.  I protected the TX staple line with the distal ileal mesentery to cover the TX staple line.  We did reinspection of the abdomen.  Hemostasis was good.   Ureters, retroperitoneum, and bowel uninjured.  The anastomosis looked healthy.   We changed gown & gloves. The patient was re-draped.  Sterile unused instruments were used from this point out per SSI prevention protocol.       We reinspected the abdomen.  Hemostasis was good.   No injury.  The anastomosis looked healthy.  We changed gown and gloves.  I placed On-Q catheter and sheaths into the preperitoneal space under direct palpation.  I closed the 14mm port sites using Monocryl stitch and sterile dressing.  I closed the midline incision using #1 PDS running closure. I closed the skin with some interrupted Monocryl stitches. I placed betadine-soaked wicks in between those areas.  I placed sterile dressing.  OnQ catheters placed & sheaths peeled away.  Patient is being extubated go to recovery room. I discussed postop care with the patient in detail the office & in the holding area. Instructions are written. I updated the patient's status to the family.  Recommendations were made.  Questions were answered.  The family expressed understanding & appreciation.

## 2013-08-12 NOTE — Anesthesia Preprocedure Evaluation (Addendum)
Anesthesia Evaluation  Patient identified by MRN, date of birth, ID band Patient awake    Reviewed: Allergy & Precautions, H&P , NPO status , Patient's Chart, lab work & pertinent test results  Airway Mallampati: II TM Distance: >3 FB Neck ROM: full    Dental  (+) Dental Advisory Given Cracked and darkened right upper front:   Pulmonary neg pulmonary ROS, Current Smoker,  breath sounds clear to auscultation  Pulmonary exam normal       Cardiovascular Exercise Tolerance: Good hypertension, Rhythm:regular Rate:Normal     Neuro/Psych negative neurological ROS  negative psych ROS   GI/Hepatic negative GI ROS, Neg liver ROS,   Endo/Other  negative endocrine ROS  Renal/GU negative Renal ROS  negative genitourinary   Musculoskeletal   Abdominal   Peds  Hematology negative hematology ROS (+)   Anesthesia Other Findings   Reproductive/Obstetrics negative OB ROS                          Anesthesia Physical Anesthesia Plan  ASA: II  Anesthesia Plan: General   Post-op Pain Management:    Induction: Intravenous  Airway Management Planned: Oral ETT  Additional Equipment:   Intra-op Plan:   Post-operative Plan: Extubation in OR  Informed Consent: I have reviewed the patients History and Physical, chart, labs and discussed the procedure including the risks, benefits and alternatives for the proposed anesthesia with the patient or authorized representative who has indicated his/her understanding and acceptance.   Dental Advisory Given  Plan Discussed with: CRNA and Surgeon  Anesthesia Plan Comments:         Anesthesia Quick Evaluation

## 2013-08-12 NOTE — H&P (Signed)
[place], MD, [address] [place], [place] [address] Phone: [phone] FAX: [place]  03-23-1970 [id number]  CARE TEAM:  PCP: No PCP Per Patient  Outpatient Care Team: Patient Care Team: No Pcp Per Patient as PCP - General (General Practice) Irene Shipper, MD as Consulting Physician (Gastroenterology) Jerene Bears, MD as Consulting Physician (Gastroenterology)  Inpatient Treatment Team: Treatment Team: Attending Provider: Adin Hector, MD  This patient is a 43 y.o.male who presents today for surgical evaluation at the request of Dr. Caralyn Guile.   Reason for visit: Abdominal pain with nausea. Mass in small bowel & mesentery ?Carcinoid?  Pleasant young man struggling with intermittent crampy pain. When I came in to the room he was bent over. Then he seemed to improve a minute or 2 later. He struggled with intermittent crampy abdominal pain. Seems to be right-sided. Can have sharp stabbing episodes. He had a couple episodes of nausea and vomiting associated with it. He was admitted. He was seen by gastroenterology. CT scan underwhelming. Upper and lower endoscopy revealed some mild duodenal inflammation. Not severe. Started on proton pump inhibitor. The patient wonders if that helped a little bit but not as much as he told. He also had significant bleeding. No strong bleeding source noted.   Because he has had more persistent crampy symptoms, he had continued workup. Meckel's scan underwhelming. However, concern for perhaps mesenteric mass noted. MRI enterography confirms presence of a 3cm mass in the small bowel mesentery. Concern given that on the same right side as his symptoms. Surgical consultation recommended. Small bowel capsule endoscopy done susp[icious for intraluminal mass.   Patient is normally rather active but has had cramping pain with this. He does continue to  smoke. Normally has a bowel movement every day. No prior abdominal surgeries. No personal nor family history of GI/colon cancer, inflammatory bowel disease, irritable bowel syndrome, allergy such as Celiac Sprue, dietary/dairy problems, colitis, ulcers nor gastritis. No recent sick contacts/gastroenteritis. No travel outside the country. No changes in diet. No dysphagia to solids or liquids. No significant heartburn or reflux. No hematochezia, hematemesis, coffee ground emesis. No evidence of prior gastric/peptic ulceration.  No new events.     Past Medical History  Diagnosis Date  . Anxiety   . Diverticulitis   . Gastroduodenitis   . Hypertension     bp runs high sometimes, no bp meds  . Depression   . GERD (gastroesophageal reflux disease)     no meds for  . Blood in stool 5--31-2015    Past Surgical History  Procedure Laterality Date  . Colonoscopy N/A 05/09/2013    Procedure: COLONOSCOPY;  Surgeon: Irene Shipper, MD;  Location: [facility];  Service: Endoscopy;  Laterality: N/A;  . Esophagogastroduodenoscopy N/A 05/09/2013    Procedure: ESOPHAGOGASTRODUODENOSCOPY (EGD);  Surgeon: Irene Shipper, MD;  Location: [facility];  Service: Endoscopy;  Laterality: N/A;  possible egd depending on colon results  . Tonsillectomy  as child    History   Social History  . Marital Status: Single    Spouse Name: N/A    Number of Children: 6  . 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 Smoking status: Current Every Day Smoker -- 0.50 packs/day for 10 years  Types: Cigarettes  . Smokeless tobacco: Never Used  . Alcohol Use: Yes     Comment: 2-3 beers some days  . Drug Use: Yes    Special: Marijuana  . Sexual Activity: Not on file   Other Topics Concern  . Not on file   Social History Narrative  . No narrative on file    Family History  Problem Relation Age of Onset  . Diabetes Paternal Grandmother   .  Hypertension Paternal Grandmother   . Diabetes Paternal Uncle   . Diabetes Paternal Aunt   . Liver disease Paternal Uncle   . Hypertension Paternal Aunt   . Anuerysm Mother     brain    Current Facility-Administered Medications  Medication Dose Route Frequency Provider Last Rate Last Dose  . cefoTEtan in Dextrose 5% (CEFOTAN) IVPB 2 g  2 g Intravenous On Call to OR Adin Hector, MD      . clindamycin (CLEOCIN) 900 mg, gentamicin (GARAMYCIN) 240 mg in sodium chloride 0.9 % 1,000 mL for intraperitoneal lavage   Intraperitoneal To OR Adin Hector, MD       Facility-Administered Medications Ordered in Other Encounters  Medication Dose Route Frequency Provider Last Rate Last Dose  . lactated ringers infusion    Continuous PRN Maxwell Caul, CRNA         No Known Allergies  ROS: Constitutional:  No fevers, chills, sweats.  Weight stable Eyes:  No vision changes, No discharge HENT:  No sore throats, nasal drainage Lymph: No neck swelling, No bruising easily Pulmonary:  No cough, productive sputum CV: No orthopnea, PND  No exertional chest/neck/shoulder/arm pain. GI:  No personal nor family history of GI/colon cancer, inflammatory bowel disease, irritable bowel syndrome, allergy such as Celiac Sprue, dietary/dairy problems, colitis, ulcers nor gastritis.  No recent sick contacts/gastroenteritis.  No travel outside the country.  No changes in diet. Renal: No UTIs, No hematuria Genital:  No drainage, bleeding, masses Musculoskeletal: No severe joint pain.  Good ROM major joints Skin:  No sores or lesions.  No rashes Heme/Lymph:  No easy bleeding.  No swollen lymph nodes Neuro: No focal weakness/numbness.  No seizures Psych: No suicidal ideation.  No hallucinations  BP 139/86  Pulse 90  Temp(Src) 98.4 F (36.9 C) (Oral)  Resp 16  SpO2 95%  Physical Exam: General: Pt awake/alert/oriented x4 in no major acute distress Eyes: PERRL, normal EOM. Sclera nonicteric Neuro: CN  II-XII intact w/o focal sensory/motor deficits. Lymph: No head/neck/groin lymphadenopathy Psych:  No delerium/psychosis/paranoia HENT: Normocephalic, Mucus membranes moist.  No thrush Neck: Supple, No tracheal deviation Chest: No pain.  Good respiratory excursion. CV:  Pulses intact.  Regular rhythm Abdomen: Soft, Nondistended.  Mild right sided abdominal TTP.  No incarcerated hernias. Ext:  SCDs BLE.  No significant edema.  No cyanosis Skin: No petechiae / purpurea.  No major sores Musculoskeletal: No severe joint pain.  Good ROM major joints   Results:   Labs: Results for orders placed during the hospital encounter of 08/12/13 (from the past 48 hour(s))  TYPE AND SCREEN     Status: None   Collection Time    08/12/13  6:05 AM      Result Value Ref Range   ABO/RH(D) O POS     Antibody Screen NEG     Sample Expiration 08/15/2013    ABO/RH     Status: None   Collection Time    08/12/13  6:30 AM  Result Value Ref Range   ABO/RH(D) O POS      Imaging / Studies: Dg Chest 2 View  08/04/2013   CLINICAL DATA:  43 year old male preoperative study for abdomen exploration, resection of mesenteric mass. Initial encounter.  EXAM: CHEST  2 VIEW  COMPARISON:  CT Abdomen and Pelvis 05/05/2013 and earlier.  FINDINGS: Normal cardiac size and mediastinal contours. Visualized tracheal air column is within normal limits. Lung volumes are within normal limits. No pneumothorax, pulmonary edema, pleural effusion, confluent pulmonary opacity, or pulmonary nodule/ mass. Visualized bowel gas pattern is non obstructed. No acute osseous abnormality identified.  IMPRESSION: No acute cardiopulmonary abnormality.   Electronically Signed   By: Lars Pinks M.D.   On: 08/04/2013 14:40    Medications / Allergies: per chart  Antibiotics: Anti-infectives   Start     Dose/Rate Route Frequency Ordered Stop   08/12/13 0715  clindamycin (CLEOCIN) 900 mg, gentamicin (GARAMYCIN) 240 mg in sodium chloride 0.9 % 1,000  mL for intraperitoneal lavage      Intraperitoneal To Surgery 08/12/13 0649 08/13/13 0715   08/12/13 0546  cefoTEtan in Dextrose 5% (CEFOTAN) IVPB 2 g    Comments:  Pharmacy may adjust dose strength for optimal dosing.   Send with patient on call to the OR.  Anesthesia to complete antibiotic administration <22min prior to incision per [facility].   2 g Intravenous On call to O.R. 08/12/13 0547 08/13/13 0559      Assessment  Evangeline Dakin  43 y.o. male  Day of Surgery  Procedure(s): LAPAROSCOPIC SMALL BOWEL RESECTION  Problem List:  Active Problems:   * No active hospital problems. *   Right-sided did small bowel mesenteric mass with intermittent cramping nausea and bleeding. 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   Plan:  Lap assisted SB resection. Make sure there is no evidence of adhesions or internal hernia. The patient's interested in proceeding:   The anatomy & physiology of the digestive tract was discussed. The pathophysiology of the SB was discussed. Natural history risks without surgery was discussed. I feel the risks of no intervention will lead to serious problems that outweigh the operative risks; therefore, I recommended a partial colectomy to remove the pathology. Minimally invasive (Robotic/Laparoscopic) & open techniques were discussed.  Risks such as bleeding, infection, abscess, leak, reoperation, possible ostomy, hernia, heart attack, death, and other risks were discussed. I noted a good likelihood this will help address the problem. Goals of post-operative recovery were discussed as well. Need for bowel regimen and healthy physical activity to optimize recovery noted as well. We will work to minimize complications. Educational materials were given as well. Questions were answered. The patient expresses understanding & wishes to proceed with surgery.   STOP SMOKING! We talked to the patient about the dangers of smoking.  We stressed that tobacco use dramatically increases  the risk of peri-operative complications such as infection, tissue necrosis leaving to problems with incision/wound and organ healing, hernia, chronic pain, heart attack, stroke, DVT, pulmonary embolism, and death.  We noted there are programs in our community to help stop smoking.  Information was available.    -VTE prophylaxis- SCDs, etc -mobilize as tolerated to help recovery    Adin Hector, M.D., F.A.C.S. Gastrointestinal and Minimally Invasive Surgery [facility] [address], [place], [place] [address] [phone] Main / Paging   08/12/2013  Note: This dictation was prepared with Dragon/digital dictation along with [facility] technology. Any transcriptional errors that result  from this process are unintentional.

## 2013-08-12 NOTE — Transfer of Care (Signed)
Immediate Anesthesia Transfer of Care Note  Patient: Leslie Mcmillan  Procedure(s) Performed: Procedure(s) (LRB): LAPAROSCOPIC ASSISTED APPENDECTOMY (N/A) SMALL BOWEL ILEAL RESECTION (N/A)  Patient Location: PACU  Anesthesia Type: General  Level of Consciousness: sedated, patient cooperative and responds to stimulation  Airway & Oxygen Therapy: Patient Spontanous Breathing and Patient connected to face mask oxgen  Post-op Assessment: Report given to PACU RN and Post -op Vital signs reviewed and stable  Post vital signs: Reviewed and stable  Complications: No apparent anesthesia complications

## 2013-08-13 ENCOUNTER — Encounter (INDEPENDENT_AMBULATORY_CARE_PROVIDER_SITE_OTHER): Payer: 59 | Admitting: Surgery

## 2013-08-13 ENCOUNTER — Encounter (HOSPITAL_COMMUNITY): Payer: Self-pay | Admitting: Surgery

## 2013-08-13 LAB — CBC
HCT: 36.9 % — ABNORMAL LOW (ref 39.0–52.0)
Hemoglobin: 12 g/dL — ABNORMAL LOW (ref 13.0–17.0)
MCH: 26.7 pg (ref 26.0–34.0)
MCHC: 32.5 g/dL (ref 30.0–36.0)
MCV: 82 fL (ref 78.0–100.0)
Platelets: 236 10*3/uL (ref 150–400)
RBC: 4.5 MIL/uL (ref 4.22–5.81)
RDW: 14 % (ref 11.5–15.5)
WBC: 12.7 10*3/uL — AB (ref 4.0–10.5)

## 2013-08-13 LAB — BASIC METABOLIC PANEL
BUN: 11 mg/dL (ref 6–23)
CALCIUM: 9.1 mg/dL (ref 8.4–10.5)
CO2: 27 mEq/L (ref 19–32)
CREATININE: 1.32 mg/dL (ref 0.50–1.35)
Chloride: 101 mEq/L (ref 96–112)
GFR, EST AFRICAN AMERICAN: 76 mL/min — AB (ref >90)
GFR, EST NON AFRICAN AMERICAN: 65 mL/min — AB (ref >90)
Glucose, Bld: 116 mg/dL — ABNORMAL HIGH (ref 70–99)
Potassium: 4.4 mEq/L (ref 3.7–5.3)
SODIUM: 140 meq/L (ref 137–147)

## 2013-08-13 LAB — MAGNESIUM: MAGNESIUM: 2.3 mg/dL (ref 1.5–2.5)

## 2013-08-13 NOTE — Care Management Note (Signed)
    Page 1 of 1   08/13/2013     10:55:46 AM CARE MANAGEMENT NOTE 08/13/2013  Patient:  [facility]   Account Number:  [id number]  Date Initiated:  08/13/2013  Documentation initiated by:  Sunday Spillers  Subjective/Objective Assessment:   43 yo male admitted s/p appy, SBR. PTA lived at home with friends.     Action/Plan:   Home when stable   Anticipated DC Date:  08/16/2013   Anticipated DC Plan:  HOME/SELF CARE         Choice offered to / List presented to:             Status of service:  Completed, signed off Medicare Important Message given?  NO (If response is "NO", the following Medicare IM given date fields will be blank) Date Medicare IM given:   Date Additional Medicare IM given:    Discharge Disposition:  HOME/SELF CARE  Per UR Regulation:  Reviewed for med. necessity/level of care/duration of stay  If discussed at [place] of Stay Meetings, dates discussed:    Comments:

## 2013-08-13 NOTE — Progress Notes (Signed)
[place], MD, [address] [place], [place] [address] Phone: [phone] FAX: [phone]    Leslie Mcmillan [id number] 25-Sep-1970  CARE TEAM:  PCP: No PCP Per Patient  Outpatient Care Team: Patient Care Team: No Pcp Per Patient as PCP - General ([place]) Irene Shipper, MD as Consulting Physician (Gastroenterology) Jerene Bears, MD as Consulting Physician (Gastroenterology)  Inpatient Treatment Team: Treatment Team: Attending Provider: Adin Hector, MD   Subjective:  Sore Walked in hallways Tol clears Foley being d/c'd  Objective:  Vital signs:  Filed Vitals:   08/12/13 1500 08/12/13 1745 08/12/13 2222 08/13/13 0030  BP: 128/76 125/79 124/67   Pulse: 66 72 70   Temp: 98 F (36.7 C) 98.1 F (36.7 C) 98 F (36.7 C)   TempSrc: Oral Oral Oral   Resp: $Remo'18 17 18   'IJRZw$ Height:      Weight:    219 lb 12.8 oz (99.7 kg)  SpO2: 95% 97% 94%     Last BM Date: 08/12/13  Intake/Output   Yesterday:  06/09 0701 - 06/10 0700 In: 4800 [P.O.:960; I.V.:3840] Out: 975 [Urine:950; Blood:25] This shift:  Total I/O In: 9323 [P.O.:720; I.V.:595] Out: 300 [Urine:300]  Bowel function:  Flatus: n  BM: n  Drain: n/a  Physical Exam:  General: Pt awake/alert/oriented x4 in no acute distress Eyes: PERRL, normal EOM.  Sclera clear.  No icterus Neuro: CN II-XII intact w/o focal sensory/motor deficits. Lymph: No head/neck/groin lymphadenopathy Psych:  No delerium/psychosis/paranoia HENT: Normocephalic, Mucus membranes moist.  No thrush Neck: Supple, No tracheal deviation Chest: No chest wall pain w good excursion CV:  Pulses intact.  Regular rhythm MS: Normal AROM mjr joints.  No obvious deformity Abdomen: Soft.  Nondistended.  Mildly tender at incisions only.  No evidence of peritonitis.  No incarcerated hernias. Ext:  SCDs BLE.  No mjr edema.  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 male  1 Day Post-Op  Procedure(s): LAPAROSCOPIC ASSISTED APPENDECTOMY SMALL BOWEL ILEAL RESECTION  Stable  Plan:  -liquids PO -elevated Cr but close to baseline & good UOP - follow -f/u path  -VTE prophylaxis- SCDs, etc -mobilize as tolerated to help recovery  I updated the patient's status to the patient.  Recommendations were made.  Questions were answered.  The patient expressed understanding & appreciation.  Adin Hector, M.D., F.A.C.S. Gastrointestinal and Minimally Invasive Surgery [facility] [address] [place], [place] [address] [phone] Main / Paging   08/13/2013   Results:   Labs: Results for orders placed during the hospital encounter of 08/12/13 (from the past 48 hour(s))  TYPE AND SCREEN     Status: None   Collection Time    08/12/13  6:05 AM      Result Value Ref Range   ABO/RH(D) O POS     Antibody Screen NEG     Sample Expiration 08/15/2013    ABO/RH     Status: None   Collection Time    08/12/13  6:30 AM      Result Value Ref Range   ABO/RH(D) O POS    CBC  Status: Abnormal   Collection Time    08/12/13  1:05 PM      Result Value Ref Range   WBC 14.4 (*) 4.0 - 10.5 K/uL   RBC 4.97  4.22 - 5.81 MIL/uL   Hemoglobin 13.5  13.0 - 17.0 g/dL   HCT 40.8  39.0 - 52.0 %   MCV 82.1  78.0 - 100.0 fL   MCH 27.2  26.0 - 34.0 pg   MCHC 33.1  30.0 - 36.0 g/dL   RDW 13.8  11.5 - 15.5 %   Platelets 227  150 - 400 K/uL  CREATININE, SERUM     Status: Abnormal   Collection Time    08/12/13  1:05 PM      Result Value Ref Range   Creatinine, Ser 1.28  0.50 - 1.35 mg/dL   GFR calc non Af Amer 68 (*) >90 mL/min   GFR calc Af Amer 78 (*) >90  mL/min   Comment: (NOTE)     The eGFR has been calculated using the CKD EPI equation.     This calculation has not been validated in all clinical situations.     eGFR's persistently <90 mL/min signify possible Chronic Kidney     Disease.  BASIC METABOLIC PANEL     Status: Abnormal   Collection Time    08/13/13  4:22 AM      Result Value Ref Range   Sodium 140  137 - 147 mEq/L   Potassium 4.4  3.7 - 5.3 mEq/L   Chloride 101  96 - 112 mEq/L   CO2 27  19 - 32 mEq/L   Glucose, Bld 116 (*) 70 - 99 mg/dL   BUN 11  6 - 23 mg/dL   Creatinine, Ser 1.32  0.50 - 1.35 mg/dL   Calcium 9.1  8.4 - 10.5 mg/dL   GFR calc non Af Amer 65 (*) >90 mL/min   GFR calc Af Amer 76 (*) >90 mL/min   Comment: (NOTE)     The eGFR has been calculated using the CKD EPI equation.     This calculation has not been validated in all clinical situations.     eGFR's persistently <90 mL/min signify possible Chronic Kidney     Disease.  CBC     Status: Abnormal   Collection Time    08/13/13  4:22 AM      Result Value Ref Range   WBC 12.7 (*) 4.0 - 10.5 K/uL   RBC 4.50  4.22 - 5.81 MIL/uL   Hemoglobin 12.0 (*) 13.0 - 17.0 g/dL   HCT 36.9 (*) 39.0 - 52.0 %   MCV 82.0  78.0 - 100.0 fL   MCH 26.7  26.0 - 34.0 pg   MCHC 32.5  30.0 - 36.0 g/dL   RDW 14.0  11.5 - 15.5 %   Platelets 236  150 - 400 K/uL  MAGNESIUM     Status: None   Collection Time    08/13/13  4:22 AM      Result Value Ref Range   Magnesium 2.3  1.5 - 2.5 mg/dL    Imaging / Studies: No results found.  Medications / Allergies: per chart  Antibiotics: Anti-infectives   Start     Dose/Rate Route Frequency Ordered Stop   08/12/13 1800  cefoTEtan (CEFOTAN) 2 g in dextrose 5 % 50 mL IVPB     2 g 100 mL/hr over 30 Minutes Intravenous Every 12 hours 08/12/13 1221 08/12/13 1856  08/12/13 0715  clindamycin (CLEOCIN) 900 mg, gentamicin (GARAMYCIN) 240 mg in sodium chloride 0.9 % 1,000 mL for intraperitoneal lavage  Status:  Discontinued       Intraperitoneal To Surgery 08/12/13 0649 08/12/13 1203   08/12/13 0546  cefoTEtan in Dextrose 5% (CEFOTAN) IVPB 2 g    Comments:  Pharmacy may adjust dose strength for optimal dosing.   Send with patient on call to the OR.  Anesthesia to complete antibiotic administration <29min prior to incision per [facility].   2 g Intravenous On call to O.R. 08/12/13 0547 08/12/13 0745       Note: This dictation was prepared with Dragon/digital dictation along with [employer] technology. Any transcriptional errors that result from this process are unintentional.

## 2013-08-14 ENCOUNTER — Telehealth (INDEPENDENT_AMBULATORY_CARE_PROVIDER_SITE_OTHER): Payer: Self-pay

## 2013-08-14 DIAGNOSIS — C172 Malignant neoplasm of ileum: Secondary | ICD-10-CM

## 2013-08-14 MED ORDER — LACTATED RINGERS IV BOLUS (SEPSIS): 1000.0000 mL | Freq: Three times a day (TID) | INTRAVENOUS | Status: AC | PRN

## 2013-08-14 MED ORDER — SODIUM CHLORIDE 0.9 % IJ SOLN: 3.0000 mL | INTRAMUSCULAR | Status: DC | PRN

## 2013-08-14 MED ORDER — SODIUM CHLORIDE 0.9 % IJ SOLN
3.0000 mL | Freq: Two times a day (BID) | INTRAMUSCULAR | Status: DC
  Administered 2013-08-14 – 2013-08-16 (×4): 3 mL via INTRAVENOUS

## 2013-08-14 NOTE — Telephone Encounter (Signed)
Added pt to GI cancer conf. For 08/20/13 per Dr Johney Maine.

## 2013-08-14 NOTE — Progress Notes (Signed)
[place], MD, [address] [place], [place] [address] Phone: [phone] FAX: [phone]    Leslie Mcmillan [id number] Feb 05, 1971  CARE TEAM:  PCP: No PCP Per Patient  Outpatient Care Team: Patient Care Team: No Pcp Per Patient as PCP - General (General Practice) Irene Shipper, MD as Consulting Physician (Gastroenterology) Jerene Bears, MD as Consulting Physician (Gastroenterology)  Inpatient Treatment Team: Treatment Team: Attending Provider: Adin Hector, MD; Registered Nurse: Emeterio Reeve, RN   Subjective:  Sore - pain controlled Walking in hallways Tol clears  Objective:  Vital signs:  Filed Vitals:   08/13/13 1320 08/13/13 2158 08/14/13 0500 08/14/13 0545  BP: 153/90 134/83  136/85  Pulse: 65 76  71  Temp: 98.4 F (36.9 C) 98.1 F (36.7 C)  98.5 F (36.9 C)  TempSrc: Oral Oral  Oral  Resp: _0 Height:      Weight:   220 lb 0.3 oz (99.8 kg)   SpO2: 96% 99%  96%    Last BM Date: 08/12/13  Intake/Output   Yesterday:  06/10 0701 - 06/11 0700 In: 3125 [P.O.:1920; I.V.:1205] Out: 5277 [Urine:3175] This shift:  Total I/O In: 56 [P.O.:960; I.V.:600] Out: 1775 [Urine:1775]  Bowel function:  Flatus: n  BM: n  Drain: n/a  Physical Exam:  General: Pt awake/alert/oriented x4 in no acute distress Eyes: PERRL, normal EOM.  Sclera clear.  No icterus Neuro: CN II-XII intact w/o focal sensory/motor deficits. Lymph: No head/neck/groin lymphadenopathy Psych:  No delerium/psychosis/paranoia HENT: Normocephalic, Mucus membranes moist.  No thrush Neck: Supple, No tracheal deviation Chest: No chest wall pain w good excursion CV:  Pulses intact.  Regular rhythm MS: Normal AROM mjr joints.  No obvious deformity Abdomen: Soft.  Nondistended.  Mildly tender at incisions only.  No evidence of peritonitis.  No incarcerated hernias. Ext:  SCDs BLE.  No mjr edema.  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 s/p resection 08/12/2013   Condyloma acuminatum of penis   Assessment  Leslie Mcmillan  43 y.o. male  2 Days Post-Op  Procedure(s): LAPAROSCOPIC ASSISTED APPENDECTOMY SMALL BOWEL ILEAL RESECTION  Stable  Plan:  -liquids PO - adv to fulls -elevated Cr but close to baseline & good UOP - follow -f/u path  -VTE prophylaxis- SCDs, etc -mobilize as tolerated to help recovery  I updated the patient's status to the patient.  Recommendations were made.  Questions were answered.  The patient expressed understanding & appreciation.  Adin Hector, M.D., F.A.C.S. Gastrointestinal and Minimally Invasive Surgery [facility] [address] [place], [place] [address] [phone] Main / Paging   08/14/2013   Results:   Labs: Results for orders placed during the hospital encounter of 08/12/13 (from the past 48 hour(s))  CBC     Status: Abnormal   Collection Time    08/12/13  1:05 PM      Result Value Ref Range   WBC 14.4 (*) 4.0 - 10.5 K/uL   RBC 4.97  4.22 - 5.81 MIL/uL   Hemoglobin 13.5  13.0 - 17.0 g/dL   HCT 40.8  39.0 - 52.0 %   MCV 82.1  78.0 - 100.0 fL   MCH 27.2  26.0 - 34.0  pg   MCHC 33.1  30.0 - 36.0 g/dL   RDW 13.8  11.5 - 15.5 %   Platelets 227  150 - 400 K/uL  CREATININE, SERUM     Status: Abnormal   Collection Time    08/12/13  1:05 PM      Result Value Ref Range   Creatinine, Ser 1.28  0.50 - 1.35 mg/dL   GFR calc non Af Amer 68 (*) >90 mL/min   GFR calc Af Amer 78 (*) >90 mL/min   Comment: (NOTE)     The eGFR has been calculated using the CKD EPI equation.     This calculation has not been validated in all clinical situations.     eGFR's persistently <90 mL/min signify possible Chronic Kidney     Disease.  BASIC METABOLIC  PANEL     Status: Abnormal   Collection Time    08/13/13  4:22 AM      Result Value Ref Range   Sodium 140  137 - 147 mEq/L   Potassium 4.4  3.7 - 5.3 mEq/L   Chloride 101  96 - 112 mEq/L   CO2 27  19 - 32 mEq/L   Glucose, Bld 116 (*) 70 - 99 mg/dL   BUN 11  6 - 23 mg/dL   Creatinine, Ser 1.32  0.50 - 1.35 mg/dL   Calcium 9.1  8.4 - 10.5 mg/dL   GFR calc non Af Amer 65 (*) >90 mL/min   GFR calc Af Amer 76 (*) >90 mL/min   Comment: (NOTE)     The eGFR has been calculated using the CKD EPI equation.     This calculation has not been validated in all clinical situations.     eGFR's persistently <90 mL/min signify possible Chronic Kidney     Disease.  CBC     Status: Abnormal   Collection Time    08/13/13  4:22 AM      Result Value Ref Range   WBC 12.7 (*) 4.0 - 10.5 K/uL   RBC 4.50  4.22 - 5.81 MIL/uL   Hemoglobin 12.0 (*) 13.0 - 17.0 g/dL   HCT 36.9 (*) 39.0 - 52.0 %   MCV 82.0  78.0 - 100.0 fL   MCH 26.7  26.0 - 34.0 pg   MCHC 32.5  30.0 - 36.0 g/dL   RDW 14.0  11.5 - 15.5 %   Platelets 236  150 - 400 K/uL  MAGNESIUM     Status: None   Collection Time    08/13/13  4:22 AM      Result Value Ref Range   Magnesium 2.3  1.5 - 2.5 mg/dL    Imaging / Studies: No results found.  Medications / Allergies: per chart  Antibiotics: Anti-infectives   Start     Dose/Rate Route Frequency Ordered Stop   08/12/13 1800  cefoTEtan (CEFOTAN) 2 g in dextrose 5 % 50 mL IVPB     2 g 100 mL/hr over 30 Minutes Intravenous Every 12 hours 08/12/13 1221 08/12/13 1856   08/12/13 0715  clindamycin (CLEOCIN) 900 mg, gentamicin (GARAMYCIN) 240 mg in sodium chloride 0.9 % 1,000 mL for intraperitoneal lavage  Status:  Discontinued      Intraperitoneal To Surgery 08/12/13 0649 08/12/13 1203   08/12/13 0546  cefoTEtan in Dextrose 5% (CEFOTAN) IVPB 2 g    Comments:  Pharmacy may adjust dose strength for optimal dosing.   Send with patient on call to the OR.  Anesthesia  to complete antibiotic  administration <66mn prior [facility]   2 g Intravenous On call to O.R. 08/12/13 0547 08/12/13 0745       Note: This dictation was prepared with Dragon/digital dictation along with Smartphrase technology. Any transcriptional errors that result from this process are unintentional.

## 2013-08-15 ENCOUNTER — Telehealth: Payer: Self-pay | Admitting: Internal Medicine

## 2013-08-15 MED ORDER — OXYCODONE HCL 5 MG PO TABS
5.0000 mg | ORAL_TABLET | ORAL | Status: DC | PRN
  Administered 2013-08-15 (×2): 10 mg via ORAL
  Administered 2013-08-15 (×2): 5 mg via ORAL
  Administered 2013-08-16 (×4): 10 mg via ORAL
  Filled 2013-08-15: qty 2
  Filled 2013-08-15: qty 1
  Filled 2013-08-15 (×2): qty 2
  Filled 2013-08-15: qty 1
  Filled 2013-08-15 (×3): qty 2

## 2013-08-15 NOTE — Discharge Summary (Signed)
Physician Discharge Summary  Patient ID: Leslie Mcmillan MRN: [id number] DOB/AGE: [birth year] 43 y.o.  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  Underwent laparoscopically assisted resection of ileum and lymph nodes.  Postoperatively, the patient mobilized in the hallways and advanced to a solid diet gradually.  Pain was well-controlled and transitioned off IV medications.    By the time of discharge, the patient was walking well the hallways, eating food well, having flatus.  He began to have bowel movements.  Pain was-controlled on an oral regimen.  Pathology noted carcinoid with 3/6 lymph nodes positive.  Pathology discussed with the patient.  Copy left in room.  Medical oncology consultation requested as an outpatient.  Based on meeting DC criteria and recovering well, I felt it was safe for the patient to be discharged home with close followup.  Instructions were discussed in detail.  They are written as well.     Consults: None  Significant Diagnostic Studies:   Results for orders placed during the hospital encounter of 08/12/13 (from the past 72 hour(s))  CBC     Status: Abnormal   Collection Time    08/12/13  1:05 PM      Result Value Ref Range   WBC 14.4 (*) 4.0 - 10.5 K/uL   RBC 4.97  4.22 - 5.81 MIL/uL   Hemoglobin 13.5  13.0 - 17.0 g/dL   HCT 40.8  39.0 - 52.0 %   MCV 82.1  78.0 - 100.0 fL   MCH 27.2  26.0 - 34.0 pg   MCHC 33.1  30.0 - 36.0 g/dL   RDW 13.8  11.5 - 15.5 %   Platelets 227  150 - 400 K/uL  CREATININE, SERUM     Status: Abnormal   Collection Time    08/12/13  1:05 PM      Result Value Ref Range    Creatinine, Ser 1.28  0.50 - 1.35 mg/dL   GFR calc non Af Amer 68 (*) >90 mL/min   GFR calc Af Amer 78 (*) >90 mL/min   Comment: (NOTE)     The eGFR has been calculated using the CKD EPI equation.     This calculation has not been validated in all clinical situations.     eGFR's persistently <90 mL/min signify possible Chronic Kidney     Disease.  BASIC METABOLIC PANEL     Status: Abnormal   Collection Time    08/13/13  4:22 AM      Result Value Ref Range   Sodium 140  137 - 147 mEq/L   Potassium 4.4  3.7 - 5.3 mEq/L   Chloride 101  96 - 112 mEq/L   CO2 27  19 - 32 mEq/L   Glucose, Bld 116 (*) 70 - 99 mg/dL   BUN 11  6 - 23 mg/dL   Creatinine, Ser 1.32  0.50 - 1.35 mg/dL   Calcium 9.1  8.4 - 10.5 mg/dL   GFR calc non Af Amer 65 (*) >90 mL/min   GFR calc Af Amer 76 (*) >90 mL/min   Comment: (NOTE)     The eGFR has been calculated using the CKD EPI equation.     This  calculation has not been validated in all clinical situations.     eGFR's persistently <90 mL/min signify possible Chronic Kidney     Disease.  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 Appendix, Other than Incidental - BENIGN APPENDICEAL TISSUE WITH REACTIVE LYMPHOID HYPERPLASIA AND FIBROUS OBLITERANS. - NO EVIDENCE OF CARCINOID TUMOR. - NO EVIDENCE OF ACTIVE INFLAMMATION OR NEOPLASIA. 2. Small intestine, resection for tumor, ileal with ileal mass, mesenteric mass - WELL DIFFERENTIATED NEUROENDOCRINE TUMOR (CARCINOID TUMOR), INVADING THROUGH THE MUSCULARIS PROPRIA INTO SUBSEROSAL TISSUE. - ANGIOLYMPHATIC INVASION PRESENT. - THREE OF SIX LYMPH NODES, POSITIVE FOR CARCINOID TUMOR (3/6). - RESECTION MARGINS, NEGATIVE FOR ATYPIA OR MALIGNANCY. Microscopic Comment 2. 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 Histologic Grade : G1, Low grade Mitotic Rate < 2/10 high-power fields (HIGH POWER FIELD), Ki-67: <3% Microscopic Tumor Extension: Invading through the muscularis propria into subserosal tissue. Margins: Negative Proximal Margin: 8 cm Distal Margin: 19.5 cm Mesenteric (Radial) Margin : 10 cm from the mucosa lesion and 0.1 cm from the positive mesenteric lymph node, please see comment for details. Distance to the closest margin: please see comment for details. Lymph-Vascular Invasion: Present Perineural Invasion: Not identified Lymph nodes: number examined 6; number positive: 3. TNM Staging: pT3, pN1 1 of 3 FINAL for Leslie, Mcmillan ([id number]) Microscopic Comment(continued) Additional findings: N/A Comments: The mucosal mass in the ileum measures 1.5 cm. The tumor cells invade through the muscularis propria into the subserosal tissue without penetration of the inked serosa. Immunohistochemical stains were performed and Ki-67 rate is less than 3%. 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 The mucosa tumor is 10 cm from the mesenteric resection margin. The tumor in the partially replaced lymph node is 0.1 cm from the inked mesenteric margin. The case was discussed with Dr. Neysa Bonito on 08/13/2013. (HCL:kh 08/13/13) Aldona Bar MD Pathologist, Electronic Signature (Case signed 08/14/2013) Intraoperative Diagnosis 1. RAPID INTRAOPERATIVE CONSULT - APPENDIX, FROZEN SECTION DIAGNOSIS: NO EVIDENCE OF MALIGNANCY ON FROZEN SECTION. (HCL) Specimen Gross and Clinical Information Specimen(s) Obtained: 1. Appendix, Other than Incidental 2. Small intestine, resection for tumor, ileal with  ileal mass, mesenteric mass Specimen Clinical Information 1. mass and small bowel mesentery possible GIST (kp) Gross 1. Received fresh for rapid intraoperative consult is an appendix measuring 7.5 cm in length. The tip measures up to 1.2 cm in diameter and is indurated. The cut surface at the tip shows fibrous thickening of the wall without a discrete mass. The remainder of the cut surfaces are unremarkable. Sections are submitted for frozen section in one block. An additional block is submitted. A = sections for frozen section. B = section parallel to stapled margin. 2. Received in formalin is a 29 cm segment of small intestine, clinically ileum. There is a long suture attached at the proximal margin. The serosa is tan to hyperemic and there is a slightly retracted area present. The mucosal surface at the retracted serosa shows a 1.5 x 1.1 cm focally ulcerated sessile mass. The cut surface of the mass is tan-yellow. The mass infiltrates the underlying muscularis but does not extend beyond the wall. The mass is located 8 cm from the proximal margin and 19.5 cm from the distal margin. The uninvolved mucosa is glistening and tan. Within the mesentery there is a 3.2 x 2.7 x 2 cm indurated tan-yellow ovoid mass, which abuts the mesenteric  margin but does not grossly appear transected. There are nine additional rubbery, tan-yellow nodules present in the mesentery varying in size from 0.3 to 0.8 cm in greatest dimension. Sections are submitted in eight cassettes. A = proximal margin. B = distal margin. C, D, E = entire mucosal lesion. F = section from mesenteric mass. G, H = lymph nodes. (GRP:ecj 08/12/2013) Stain(s) used in Diagnosis: The following stain(s) were used in diagnosing the case: KI-67-NO ACIS. The control(s) stained appropriately.   Discharge Exam: Blood pressure 124/84, pulse 66, temperature 98.7 F (37.1 C), temperature source Oral, resp. rate 18, height _0  (1.753 m), weight 220 lb 0.3 oz (99.8 kg), SpO2 95.00%.  General: Pt awake/alert/oriented x4 in no acute distress  Eyes: PERRL, normal EOM. Sclera clear. No icterus  Neuro: CN II-XII intact w/o focal sensory/motor deficits.  Lymph: No head/neck/groin lymphadenopathy  Psych: No delerium/psychosis/paranoia  HENT: Normocephalic, Mucus membranes moist. No thrush  Neck: Supple, No tracheal deviation  Chest: No chest wall pain w good excursion  CV: Pulses intact. Regular rhythm  MS: Normal AROM mjr joints. No obvious deformity  Abdomen: Soft. Nondistended. Mildly tender at incisions only. No evidence of peritonitis. No incarcerated hernias.  Ext: SCDs BLE. No mjr edema. No cyanosis  Skin: No petechiae / purpura  Patient seen and examined at 8AM this morning.  Dressings removed and wicks removed from midline incision.  Dry dressing placed.  Patient is ambulating and tolerating diet.   Disposition: 01-Home or Self Care  Discharge Instructions   Call MD for:  extreme fatigue    Complete by:  As directed      Call MD for:  hives    Complete by:  As directed      Call MD for:  persistant nausea and vomiting    Complete by:  As directed      Call MD for:  redness, tenderness, or signs of infection (pain, swelling, redness, odor or green/yellow discharge  around incision site)    Complete by:  As directed      Call MD for:  severe uncontrolled pain    Complete by:  As directed      Call MD for:    Complete by:  As  directed   Temperature > 101.38F     Diet - low sodium heart healthy    Complete by:  As directed      Discharge instructions    Complete by:  As directed   Please see discharge instruction sheets.  Also refer to handout given an office.  Please call our office if you have any questions or concerns [phone]     Discharge wound care:    Complete by:  As directed   If you have closed incisions, shower and bathe over these incisions with soap and water every day.  Remove all surgical dressings on postoperative day #3.  You do not need to replace dressings over the closed incisions unless you feel more comfortable with a Band-Aid covering it.   If you have an open wound that requires packing, please see wound care instructions.  In general, remove all dressings, wash wound with soap and water and then replace with saline moistened gauze.  Do the dressing change at least every day.  Please call our office [phone] if you have further questions.     Driving Restrictions    Complete by:  As directed   No driving until off narcotics and can safely swerve away without pain during an emergency     Increase activity slowly    Complete by:  As directed   Walk an hour a day.  Use 20-30 minute walks.  When you can walk 30 minutes without difficulty, increase to low impact/moderate activities such as biking, jogging, swimming, sexual activity..  Eventually can increase to unrestricted activity when not feeling pain.  If you feel pain: STOP!Marland Kitchen   Let pain protect you from overdoing it.  Use ice/heat/over-the-counter pain medications to help minimize his soreness.  Use pain prescriptions as needed to remain active.  It is better to take extra pain medications and be more active than to stay bedridden to avoid all pain medications.     Lifting  restrictions    Complete by:  As directed   Avoid heavy lifting initially.  Do not push through pain.  You have no specific weight limit.  Coughing and sneezing or four more stressful to your incision than any lifting you will do. Pain will protect you from injury.  Therefore, avoid intense activity until off all narcotic pain medications.  Coughing and sneezing or four more stressful to your incision than any lifting he will do.     May shower / Bathe    Complete by:  As directed      May walk up steps    Complete by:  As directed      Sexual Activity Restrictions    Complete by:  As directed   Sexual activity as tolerated.  Do not push through pain.  Pain will protect you from injury.     Walk with assistance    Complete by:  As directed   Walk over an hour a day.  May use a walker/cane/companion to help with balance and stamina.            Medication List    STOP taking these medications       HYDROcodone-acetaminophen 5-325 MG per tablet  Commonly known as:  NORCO/VICODIN     metroNIDAZOLE 500 MG tablet  Commonly known as:  FLAGYL     neomycin 500 MG tablet  Commonly known as:  MYCIFRADIN     oxyCODONE-acetaminophen 5-325 MG per tablet  Commonly known as:  PERCOCET      TAKE these medications       hyoscyamine 0.125 MG SL tablet  Commonly known as:  LEVSIN SL  Place 0.125 mg under the tongue every 4 (four) hours as needed for cramping.     oxyCODONE 5 MG immediate release tablet  Commonly known as:  Oxy IR/ROXICODONE  Take 1-2 tablets (5-10 mg total) by mouth every 4 (four) hours as needed for moderate pain, severe pain or breakthrough pain.     promethazine 25 MG tablet  Commonly known as:  PHENERGAN  Take 1 tablet (25 mg total) by mouth every 6 (six) hours as needed for nausea.           Follow-up Information   Follow up with GROSS,STEVEN C., MD In 2 weeks. (To follow up after your operation)    Specialty:  General Surgery   Contact information:   [address] [place] [address] [phone]       Signed: Adin Hector. 08/15/2013, 9:45 AM

## 2013-08-15 NOTE — Telephone Encounter (Signed)
S/W PATIENT AND GAVE NP APPT FOR 07/02 @ 1:30 W/DR. CHISM .  REFERRING DR. 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

## 2013-08-15 NOTE — Progress Notes (Signed)
[place], MD, [address] [place], [place] [address] Phone: [phone] FAX: [phone]    Reno Clasby [id number] 04/10/1970  CARE TEAM:  PCP: No PCP Per Patient  Outpatient Care Team: Patient Care Team: No Pcp Per Patient as PCP - General (General Practice) Irene Shipper, MD as Consulting Physician (Gastroenterology) Jerene Bears, MD as Consulting Physician (Gastroenterology)  Inpatient Treatment Team: Treatment Team: Attending Provider: Adin Hector, MD; Registered Nurse: Emeterio Reeve, RN; Registered Nurse: Celedonio Savage, RN   Subjective:  Sore - pain controlled Walking in hallways Tol fulls  Objective:  Vital signs:  Filed Vitals:   08/14/13 0545 08/14/13 1400 08/14/13 2200 08/15/13 0545  BP: 136/85 127/82 147/92 124/84  Pulse: 71 72 74 66  Temp: 98.5 F (36.9 C) 98.6 F (37 C) 98.7 F (37.1 C) 98.7 F (37.1 C)  TempSrc: Oral Oral Oral Oral  Resp: 18 18 18 18   Height:      Weight:      SpO2: 96% 92% 97% 95%    Last BM Date: 08/14/13  Intake/Output   Yesterday:  06/11 0701 - 06/12 0700 In: 1080 [P.O.:680; I.V.:400] Out: 1200 [Urine:1200] This shift:  Total I/O In: 120 [P.O.:120] Out: 700 [Urine:700]  Bowel function:  Flatus: Yes  BM: Yes  Drain: n/a  Physical Exam:  General: Pt awake/alert/oriented x4 in no acute distress Eyes: PERRL, normal EOM.  Sclera clear.  No icterus Neuro: CN II-XII intact w/o focal sensory/motor deficits. Lymph: No head/neck/groin lymphadenopathy Psych:  No delerium/psychosis/paranoia HENT: Normocephalic, Mucus membranes moist.  No thrush Neck: Supple, No tracheal deviation Chest: No chest wall pain w good excursion CV:  Pulses intact.  Regular rhythm MS: Normal AROM mjr joints.  No obvious deformity Abdomen: Soft.  Nondistended.  Min tender at incisions only.  No evidence of peritonitis.  No incarcerated  hernias. Ext:  SCDs BLE.  No mjr edema.  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 male  3 Days Post-Op  Procedure(s): LAPAROSCOPIC ASSISTED APPENDECTOMY SMALL BOWEL ILEAL RESECTION  Stable  Plan:  -adv to solids -switch to PO pain control -elevated Cr but baseline & good UOP - follow -path c/w carcinoid pT3pN1.  d/w pt.  Copy in room.  Med Onc outpt appt -VTE prophylaxis- SCDs, etc -mobilize as tolerated to help recovery  I updated the patient's status to the patient including pathology.  Recommendations were made.  Questions were answered.  The patient expressed understanding & appreciation.  D/C patient from hospital when patient meets criteria (anticipate in 1-2 day(s)):  Tolerating oral intake well Ambulating in walkways Adequate pain control without IV medications Urinating  Having flatus   Adin Hector, M.D., F.A.C.S. Gastrointestinal and Minimally Invasive Surgery [facility] [address], [place], [place] [address] [phone] Main / Paging   08/15/2013   Results:   Labs: No results found for this or any previous visit (from the past 48 hour(s)).  Imaging / Studies: No results found.  Medications / Allergies: per chart  Antibiotics: Anti-infectives   Start     Dose/Rate Route Frequency Ordered Stop   08/12/13 1800  cefoTEtan (CEFOTAN) 2 g in dextrose 5 % 50 mL IVPB     2 g 100 mL/hr over 30 Minutes Intravenous Every 12 hours 08/12/13 1221 08/12/13 1856   08/12/13 0715  clindamycin (CLEOCIN) 900 mg, gentamicin (GARAMYCIN) 240 mg in sodium chloride 0.9 % 1,000 mL for intraperitoneal lavage  Status:  Discontinued      Intraperitoneal To Surgery 08/12/13  0649 08/12/13 1203   08/12/13 0546  cefoTEtan in Dextrose 5% (CEFOTAN) IVPB 2 g    Comments:  Pharmacy may adjust dose strength for optimal dosing.   Send with patient on call to the OR.  Anesthesia to complete antibiotic administration <40min prior to incision per [facility].   2 g Intravenous On call to O.R. 08/12/13 0547 08/12/13 0745       Note: This dictation was prepared with Dragon/digital dictation along with Smartphrase technology. Any transcriptional errors that result from this process are unintentional.

## 2013-08-16 MED ORDER — KETOROLAC TROMETHAMINE 15 MG/ML IJ SOLN
15.0000 mg | Freq: Once | INTRAMUSCULAR | Status: AC
  Administered 2013-08-16: 15 mg via INTRAVENOUS
  Filled 2013-08-16: qty 1

## 2013-08-16 NOTE — Progress Notes (Signed)
Discharge instructions reviewed  with patient utilizing teach back method no questions at this time.  °

## 2013-08-18 ENCOUNTER — Telehealth: Payer: Self-pay | Admitting: Internal Medicine

## 2013-08-18 ENCOUNTER — Telehealth (INDEPENDENT_AMBULATORY_CARE_PROVIDER_SITE_OTHER): Payer: Self-pay | Admitting: General Surgery

## 2013-08-18 NOTE — Telephone Encounter (Signed)
C/D 08/18/13 for appt. 09/04/13

## 2013-08-18 NOTE — Progress Notes (Signed)
Discharge summary sent to payer through [facility]  

## 2013-08-18 NOTE — Telephone Encounter (Signed)
Spoke with pt and informed him of his appt on 08/26/13 at 10:00.

## 2013-08-21 ENCOUNTER — Other Ambulatory Visit (INDEPENDENT_AMBULATORY_CARE_PROVIDER_SITE_OTHER): Payer: Self-pay | Admitting: Surgery

## 2013-08-21 ENCOUNTER — Telehealth (INDEPENDENT_AMBULATORY_CARE_PROVIDER_SITE_OTHER): Payer: Self-pay

## 2013-08-21 ENCOUNTER — Other Ambulatory Visit: Payer: Self-pay | Admitting: Internal Medicine

## 2013-08-21 MED ORDER — HYOSCYAMINE SULFATE 0.125 MG SL SUBL: 0.1250 mg | SUBLINGUAL_TABLET | SUBLINGUAL | Status: DC | PRN

## 2013-08-21 MED ORDER — OXYCODONE HCL 5 MG PO TABS: 5.0000 mg | ORAL_TABLET | ORAL | Status: DC | PRN

## 2013-08-21 NOTE — Telephone Encounter (Signed)
Have him try Aleve instead.  Okay with oxycodone #50

## 2013-08-21 NOTE — Addendum Note (Signed)
Addended byFlossie Buffy on: 08/21/2013 02:49 PM   Modules accepted: Orders

## 2013-08-21 NOTE — Telephone Encounter (Signed)
Spoke with pt and informed him that he Rx is ready for pickup at the front desk.  Also informed him that he should switch from the acetaminophen to aleve to help with the pain in between the narcotic use.

## 2013-08-21 NOTE — Telephone Encounter (Signed)
Pt s/p lap appy and sm bowel resection 08/12/13. Pt is calling today to get a refill on his Oxycodone 5mg . Pt rates his pain a 9 at this time. Pt states he has also been taking Tylenol also every 4 hours with moderate relief. Pt denies any fevers, chills, n/v. Informed pt that I would send Dr Johney Maine a refill request and as soon as we get a response we will get back in touch with pt. Pt verbalized understanding and agrees with POC.

## 2013-08-26 ENCOUNTER — Ambulatory Visit (INDEPENDENT_AMBULATORY_CARE_PROVIDER_SITE_OTHER): Payer: 59 | Admitting: Surgery

## 2013-08-26 ENCOUNTER — Encounter (INDEPENDENT_AMBULATORY_CARE_PROVIDER_SITE_OTHER): Payer: Self-pay | Admitting: Surgery

## 2013-08-26 VITALS — BP 126/76 | HR 65 | Temp 98.5°F | Resp 18 | Ht 69.0 in | Wt 212.0 lb

## 2013-08-26 DIAGNOSIS — D3A012 Benign carcinoid tumor of the ileum: Secondary | ICD-10-CM

## 2013-08-26 DIAGNOSIS — A630 Anogenital (venereal) warts: Secondary | ICD-10-CM

## 2013-08-26 MED ORDER — OXYCODONE HCL 5 MG PO TABS: 5.0000 mg | ORAL_TABLET | ORAL | Status: DC | PRN

## 2013-08-26 NOTE — Patient Instructions (Signed)
Gastrointestinal carcinoid tumors form from a certain type of neuroendocrine cell (a type of cell that is like a nerve cell and a hormone -making cell). These cells are scattered throughout the chest and abdomen but most are found in the GI tract. Neuroendocrine cells make hormones that help control digestive juices and the muscles used in moving food through the stomach and intestines. A GI carcinoid tumor may also make hormones and release them into the body.  GI carcinoid tumors are rare and most grow very slowly. Most of them occur in the small intestine, rectum, and appendix. Sometimes more than one tumor will form. After a gastrointestinal carcinoid tumor has been diagnosed, tests are done to find out if cancer cells have spread within the stomach and intestines or to other parts of the body.  Staging is the process used to find out how far the cancer has spread. The information gathered from the staging process determines the stage of the disease. The results of tests and procedures used to diagnose gastrointestinal (GI) carcinoid tumors may also be used for staging  ABDOMINAL SURGERY: POST OP INSTRUCTIONS  1. DIET: Follow a light bland diet the first 24 hours after arrival home, such as soup, liquids, crackers, etc.  Be sure to include lots of fluids daily.  Avoid fast food or heavy meals as your are more likely to get nauseated.  Eat a low fat the next few days after surgery.   2. Take your usually prescribed home medications unless otherwise directed. 3. PAIN CONTROL: a. Pain is best controlled by a usual combination of three different methods TOGETHER: i. Ice/Heat ii. Over the counter pain medication iii. Prescription pain medication b. Most patients will experience some swelling and bruising around the incisions.  Ice packs or heating pads (30-60 minutes up to 6 times a day) will help. Use ice for the first few days to help decrease swelling and bruising, then switch to heat to help relax  tight/sore spots and speed recovery.  Some people prefer to use ice alone, heat alone, alternating between ice & heat.  Experiment to what works for you.  Swelling and bruising can take several weeks to resolve.   c. It is helpful to take an over-the-counter pain medication regularly for the first few weeks.  Choose one of the following that works best for you: i. Naproxen (Aleve, etc)  Two 220mg  tabs twice a day ii. Ibuprofen (Advil, etc) Three 200mg  tabs four times a day (every meal & bedtime) iii. Acetaminophen (Tylenol, etc) 500-650mg  four times a day (every meal & bedtime) d. A  prescription for pain medication (such as oxycodone, hydrocodone, etc) should be given to you upon discharge.  Take your pain medication as prescribed.  i. If you are having problems/concerns with the prescription medicine (does not control pain, nausea, vomiting, rash, itching, etc), please call us [phone] to see if we need to switch you to a different pain medicine that will work better for you and/or control your side effect better. ii. If you need a refill on your pain medication, please contact your pharmacy.  They will contact our office to request authorization. Prescriptions will not be filled after 5 pm or on week-ends. 4. Avoid getting constipated.  Between the surgery and the pain medications, it is common to experience some constipation.  Increasing fluid intake and taking a fiber supplement (such as Metamucil, Citrucel, FiberCon, MiraLax, etc) 1-2 times a day regularly will usually help prevent this problem from occurring.  A mild laxative (prune juice, Milk of Magnesia, MiraLax, etc) should be taken according to package directions if there are no bowel movements after 48 hours.   5. Watch out for diarrhea.  If you have many loose bowel movements, simplify your diet to bland foods & liquids for a few days.  Stop any stool softeners and decrease your fiber supplement.  Switching to mild anti-diarrheal  medications (Kayopectate, Pepto Bismol) can help.  If this worsens or does not improve, please call us. 6. Wash / shower every day.  You may shower over the incision / wound.  Avoid baths until the skin is fully healed.  Continue to shower over incision(s) after the dressing is off. 7. Remove your waterproof bandages 5 days after surgery.  You may leave the incision open to air.  You may replace a dressing/Band-Aid to cover the incision for comfort if you wish. 8. ACTIVITIES as tolerated:   a. You may resume regular (light) daily activities beginning the next day-such as daily self-care, walking, climbing stairs-gradually increasing activities as tolerated.  If you can walk 30 minutes without difficulty, it is safe to try more intense activity such as jogging, treadmill, bicycling, low-impact aerobics, swimming, etc. b. Save the most intensive and strenuous activity for last such as sit-ups, heavy lifting, contact sports, etc  Refrain from any heavy lifting or straining until you are off narcotics for pain control.   c. DO NOT PUSH THROUGH PAIN.  Let pain be your guide: If it hurts to do something, don't do it.  Pain is your body warning you to avoid that activity for another week until the pain goes down. d. You may drive when you are no longer taking prescription pain medication, you can comfortably wear a seatbelt, and you can safely maneuver your car and apply brakes. e. Dennis Bast may have sexual intercourse when it is comfortable.  9. FOLLOW UP in our office a. Please call [facility] at [phone] to set up an appointment to see your surgeon in the office for a follow-up appointment approximately 1-2 weeks after your surgery. b. Make sure that you call for this appointment the day you arrive home to insure a convenient appointment time. 10. IF YOU HAVE DISABILITY OR FAMILY LEAVE FORMS, BRING THEM TO THE OFFICE FOR PROCESSING.  DO NOT GIVE THEM TO YOUR DOCTOR.   WHEN TO CALL us [phone]: 1. Poor  pain control 2. Reactions / problems with new medications (rash/itching, nausea, etc)  3. Fever over 101.5 F (38.5 C) 4. Inability to urinate 5. Nausea and/or vomiting 6. Worsening swelling or bruising 7. Continued bleeding from incision. 8. Increased pain, redness, or drainage from the incision  The clinic staff is available to answer your questions during regular business hours (8:30am-5pm).  Please don't hesitate to call and ask to speak to one of our nurses for clinical concerns.   A surgeon from [facility] is always on call at the hospitals   If you have a medical emergency, go to the nearest emergency room or call 911.    [facility], [address], [place], [place], [place]  [address] ? MAIN: [phone] ? TOLL FREE: [phone] ? FAX [phone] [email or web address]   Managing Pain  Pain after surgery or related to activity is often due to strain/injury to muscle, tendon, nerves and/or incisions.  This pain is usually short-term and will improve in a few months.   Many people find it helpful to  do the following things TOGETHER to help speed the process of healing and to get back to regular activity more quickly:  1. Avoid heavy physical activity a.  no lifting greater than 20 pounds b. Do not "push through" the pain.  Listen to your body and avoid positions and maneuvers than reproduce the pain c. Walking is okay as tolerated, but go slowly and stop when getting sore.  d. Remember: If it hurts to do it, then don't do it! 2. Take Anti-inflammatory medication  a. Take with food/snack around the clock for 1-2 weeks i. This helps the muscle and nerve tissues become less irritable and calm down faster b. Choose ONE of the following over-the-counter medications: i. Naproxen 220mg  tabs (ex. Aleve) 1-2 pills twice a day  ii. Ibuprofen 200mg  tabs (ex. Advil, Motrin) 3-4 pills with every meal and just before  bedtime iii. Acetaminophen 500mg  tabs (Tylenol) 1-2 pills with every meal and just before bedtime 3. Use a Heating pad or Ice/Cold Pack a. 4-6 times a day b. May use warm bath/hottub  or showers 4. Try Gentle Massage and/or Stretching  a. at the area of pain many times a day b. stop if you feel pain - do not overdo it  Try these steps together to help you body heal faster and avoid making things get worse.  Doing just one of these things may not be enough.    If you are not getting better after two weeks or are noticing you are getting worse, contact our office for further advice; we may need to re-evaluate you & see what other things we can do to help.  GETTING TO GOOD BOWEL HEALTH. Irregular bowel habits such as constipation and diarrhea can lead to many problems over time.  Having one soft bowel movement a day is the most important way to prevent further problems.  The anorectal canal is designed to handle stretching and feces to safely manage our ability to get rid of solid waste (feces, poop, stool) out of our body.  BUT, hard constipated stools can act like ripping concrete bricks and diarrhea can be a burning fire to this very sensitive area of our body, causing inflamed hemorrhoids, anal fissures, increasing risk is perirectal abscesses, abdominal pain/bloating, an making irritable bowel worse.     The goal: ONE SOFT BOWEL MOVEMENT A DAY!  To have soft, regular bowel movements:    Drink at least 8 tall glasses of water a day.     Take plenty of fiber.  Fiber is the undigested part of plant food that passes into the colon, acting s "natures broom" to encourage bowel motility and movement.  Fiber can absorb and hold large amounts of water. This results in a larger, bulkier stool, which is soft and easier to pass. Work gradually over several weeks up to 6 servings a day of fiber (25g a day even more if needed) in the form of: o Vegetables -- Root (potatoes, carrots, turnips), leafy green  (lettuce, salad greens, celery, spinach), or cooked high residue (cabbage, broccoli, etc) o Fruit -- Fresh (unpeeled skin & pulp), Dried (prunes, apricots, cherries, etc ),  or stewed ( applesauce)  o Whole grain breads, pasta, etc (whole wheat)  o Bran cereals    Bulking Agents -- This type of water-retaining fiber generally is easily obtained each day by one of the following:  o Psyllium bran -- The psyllium plant is remarkable because its ground seeds can retain so much water. This  product is available as Metamucil, Konsyl, Effersyllium, Per Diem Fiber, or the less expensive generic preparation in drug and health food stores. Although labeled a laxative, it really is not a laxative.  o Methylcellulose -- This is another fiber derived from wood which also retains water. It is available as Citrucel. o Polyethylene Glycol - and "artificial" fiber commonly called Miralax or Glycolax.  It is helpful for people with gassy or bloated feelings with regular fiber o Flax Seed - a less gassy fiber than psyllium   No reading or other relaxing activity while on the toilet. If bowel movements take longer than 5 minutes, you are too constipated   AVOID CONSTIPATION.  High fiber and water intake usually takes care of this.  Sometimes a laxative is needed to stimulate more frequent bowel movements, but    Laxatives are not a good long-term solution as it can wear the colon out. o Osmotics (Milk of Magnesia, Fleets phosphosoda, Magnesium citrate, MiraLax, GoLytely) are safer than  o Stimulants (Senokot, Castor Oil, Dulcolax, Ex Lax)    o Do not take laxatives for more than 7days in a row.    IF SEVERELY CONSTIPATED, try a Bowel Retraining Program: o Do not use laxatives.  o Eat a diet high in roughage, such as bran cereals and leafy vegetables.  o Drink six (6) ounces of prune or apricot juice each morning.  o Eat two (2) large servings of stewed fruit each day.  o Take one (1) heaping tablespoon of a  psyllium-based bulking agent twice a day. Use sugar-free sweetener when possible to avoid excessive calories.  o Eat a normal breakfast.  o Set aside 15 minutes after breakfast to sit on the toilet, but do not strain to have a bowel movement.  o If you do not have a bowel movement by the third day, use an enema and repeat the above steps.    Controlling diarrhea o Switch to liquids and simpler foods for a few days to avoid stressing your intestines further. o Avoid dairy products (especially milk & ice cream) for a short time.  The intestines often can lose the ability to digest lactose when stressed. o Avoid foods that cause gassiness or bloating.  Typical foods include beans and other legumes, cabbage, broccoli, and dairy foods.  Every person has some sensitivity to other foods, so listen to our body and avoid those foods that trigger problems for you. o Adding fiber (Citrucel, Metamucil, psyllium, Miralax) gradually can help thicken stools by absorbing excess fluid and retrain the intestines to act more normally.  Slowly increase the dose over a few weeks.  Too much fiber too soon can backfire and cause cramping & bloating. o Probiotics (such as active yogurt, Align, etc) may help repopulate the intestines and colon with normal bacteria and calm down a sensitive digestive tract.  Most studies show it to be of mild help, though, and such products can be costly. o Medicines:   Bismuth subsalicylate (ex. Kayopectate, Pepto Bismol) every 30 minutes for up to 6 doses can help control diarrhea.  Avoid if pregnant.   Loperamide (Immodium) can slow down diarrhea.  Start with two tablets (4mg  total) first and then try one tablet every 6 hours.  Avoid if you are having fevers or severe pain.  If you are not better or start feeling worse, stop all medicines and call your doctor for advice o Call your doctor if you are getting worse or not better.  Sometimes further  testing (cultures, endoscopy, X-ray studies,  bloodwork, etc) may be needed to help diagnose and treat the cause of the diarrhea. o

## 2013-08-26 NOTE — Progress Notes (Signed)
Subjective:     Patient ID: Leslie Mcmillan, male   DOB: [birth year], 43 y.o.   MRN: [id number]  HPI  Note: This dictation was prepared with Dragon/digital dictation along with [facility] technology. Any transcriptional errors that result from this process are unintentional.       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  Intensity with his significant other.  He is gradually improving.  He still gets some soreness in his lower abdomen that radiates to his right testicle.  Less intense.  Appetite not normal but tolerating food.  No fevers or chills.  Moderate active.  Stony narcotics to sleep through the night.  Using the mass.  No fevers or chills.  Patient Active Problem List   Diagnosis Date Noted  . Condyloma acuminatum of penis 08/12/2013  . 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 Duodenitis without bleeding 05/09/2013  . Diverticulosis of colon without hemorrhage 05/09/2013  . Rectal bleeding 05/08/2013  . RLQ abdominal pain 05/08/2013    Past Medical History  Diagnosis Date  . Anxiety   . Diverticulitis   . Gastroduodenitis   . Hypertension     bp runs high sometimes, no bp meds  . Depression   . GERD (gastroesophageal reflux disease)     no meds for  . Blood in stool 5--31-2015    Past Surgical History  Procedure Laterality Date  .  Colonoscopy N/A 05/09/2013    Procedure: COLONOSCOPY;  Surgeon: Irene Shipper, MD;  Location: [facility];  Service: Endoscopy;  Laterality: N/A;  . Esophagogastroduodenoscopy N/A 05/09/2013    Procedure: ESOPHAGOGASTRODUODENOSCOPY (EGD);  Surgeon: Irene Shipper, MD;  Location: [facility];  Service: Endoscopy;  Laterality: N/A;  possible egd depending on colon results  . Tonsillectomy  as child  . Laparoscopic appendectomy N/A 08/12/2013    Procedure: LAPAROSCOPIC ASSISTED APPENDECTOMY;  Surgeon: Adin Hector, MD;  Location: [facility];  Service: General;  Laterality: N/A;  . Bowel resection N/A 08/12/2013    Procedure: SMALL BOWEL ILEAL RESECTION;  Surgeon: Adin Hector, MD;  Location: [facility];  Service: General;  Laterality: N/A;    History   Social History  . Marital Status: Single    Spouse Name: N/A    Number of Children: 6  . 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 Smoking status: Current Every Day Smoker -- 0.50 packs/day for 10 years    Types: Cigarettes  . Smokeless tobacco: Never Used  . Alcohol Use: Yes     Comment: 2-3 beers some days  . Drug Use: Yes    Special: Marijuana  . Sexual Activity: Not on file   Other Topics Concern  . Not on file   Social History Narrative  . No narrative on file    Family History  Problem Relation Age of Onset  .  Diabetes Paternal Grandmother   . Hypertension Paternal Grandmother   . Diabetes Paternal Uncle   . Diabetes Paternal Aunt   . Liver disease Paternal Uncle   . Hypertension Paternal Aunt   . Anuerysm Mother     brain    Current Outpatient Prescriptions  Medication Sig Dispense Refill  . hyoscyamine (LEVSIN SL) 0.125 MG SL tablet Place 1 tablet (0.125 mg total) under the tongue every 4 (four) hours as needed for cramping.  30 tablet  1  . naproxen sodium (ANAPROX) 220 MG tablet Take 220 mg by mouth 2 (two) times daily with a meal.      . oxyCODONE  (OXY IR/ROXICODONE) 5 MG immediate release tablet Take 1-2 tablets (5-10 mg total) by mouth every 4 (four) hours as needed for moderate pain, severe pain or breakthrough pain.  50 tablet  0  . promethazine (PHENERGAN) 25 MG tablet Take 1 tablet (25 mg total) by mouth every 6 (six) hours as needed for nausea.  20 tablet  0   No current facility-administered medications for this visit.     No Known Allergies  BP 126/76  Pulse 65  Temp(Src) 98.5 F (36.9 C)  Resp 18  Ht 5\' 9"  (1.753 m)  Wt 212 lb (96.163 kg)  BMI 31.29 kg/m2  Dg Chest 2 View  08/04/2013   CLINICAL DATA:  43 year old male preoperative study for abdomen exploration, resection of mesenteric mass. Initial encounter.  EXAM: CHEST  2 VIEW  COMPARISON:  CT Abdomen and Pelvis 05/05/2013 and earlier.  FINDINGS: Normal cardiac size and mediastinal contours. Visualized tracheal air column is within normal limits. Lung volumes are within normal limits. No pneumothorax, pulmonary edema, pleural effusion, confluent pulmonary opacity, or pulmonary nodule/ mass. Visualized bowel gas pattern is non obstructed. No acute osseous abnormality identified.  IMPRESSION: No acute cardiopulmonary abnormality.   Electronically Signed   By: Lars Pinks M.D.   On: 08/04/2013 14:40     Review of Systems  Constitutional: Positive for appetite change. Negative for fever, chills and diaphoresis.  HENT: Negative for sore throat and trouble swallowing.   Eyes: Negative for photophobia and visual disturbance.  Respiratory: Negative for choking and shortness of breath.   Cardiovascular: Negative for chest pain and palpitations.  Gastrointestinal: Positive for blood in stool. Negative for nausea, vomiting, abdominal distention and rectal pain.  Genitourinary: Positive for testicular pain. Negative for dysuria, urgency and difficulty urinating.  Musculoskeletal: Negative for arthralgias, gait problem, myalgias and neck pain.  Skin: Negative for color change and  rash.  Neurological: Negative for dizziness, speech difficulty, weakness and numbness.  Hematological: Negative for adenopathy.  Psychiatric/Behavioral: Negative for hallucinations, confusion and agitation.       Objective:   Physical Exam  Constitutional: He is oriented to person, place, and time. He appears well-developed and well-nourished. No distress.  HENT:  Head: Normocephalic.  Mouth/Throat: Oropharynx is clear and moist. No oropharyngeal exudate.  Eyes: Conjunctivae and EOM are normal. Pupils are equal, round, and reactive to light. No scleral icterus.  Neck: Normal range of motion. No tracheal deviation present.  Cardiovascular: Normal rate, normal heart sounds and intact distal pulses.   Pulmonary/Chest: Effort normal. No respiratory distress.  Abdominal: Soft. He exhibits no distension. There is no tenderness. Hernia confirmed negative in the right inguinal area and confirmed negative in the left inguinal area.  Incisions clean with normal healing ridges.  No hernias  Musculoskeletal: Normal range of motion. He exhibits no tenderness.  Neurological: He  is alert and oriented to person, place, and time. No cranial nerve deficit. He exhibits normal muscle tone. Coordination normal.  Skin: Skin is warm and dry. No rash noted. He is not diaphoretic.  Psychiatric: He has a normal mood and affect. His behavior is normal.       Assessment:     Recovering status post excision of ileum containing carcinoid.  Possible lymph nodes.    Plan:     I think considering he is only a few weeks out from surgery, he is doing rather well.  Keep appointment with medical oncology.  Probably will just put him into Survival pathway but see what options are available.  Abdominal pain and testicular or radicular pain should continue to resolve over time.  Increase activity as tolerated to regular activity.  Low impact exercise such as walking an hour a day at least ideal.  Do not push through  pain.  He wished for more oxycodone.  I obliged.  I again recommended stressing anti-inflammatories.  He uses Aleve and ice.  Diet as tolerated.  Low fat high fiber diet ideal.  Bowel regimen with 30 g fiber a day and fiber supplement as needed to avoid problems.  Return to clinic 1 month, sooner as needed.   Instructions discussed.  Followup with primary care physician for other health issues as would normally be done.  Consider screening for malignancies (breast, prostate, colon, melanoma, etc) as appropriate.  Questions answered.  The patient expressed understanding and appreciation

## 2013-09-02 ENCOUNTER — Other Ambulatory Visit (INDEPENDENT_AMBULATORY_CARE_PROVIDER_SITE_OTHER): Payer: Self-pay | Admitting: Surgery

## 2013-09-02 DIAGNOSIS — Z8719 Personal history of other diseases of the digestive system: Secondary | ICD-10-CM

## 2013-09-03 MED ORDER — OXYCODONE HCL 5 MG PO TABS: 5.0000 mg | ORAL_TABLET | ORAL | Status: DC | PRN

## 2013-09-04 ENCOUNTER — Ambulatory Visit (HOSPITAL_BASED_OUTPATIENT_CLINIC_OR_DEPARTMENT_OTHER): Payer: 59 | Admitting: Internal Medicine

## 2013-09-04 ENCOUNTER — Other Ambulatory Visit (HOSPITAL_BASED_OUTPATIENT_CLINIC_OR_DEPARTMENT_OTHER): Payer: 59

## 2013-09-04 ENCOUNTER — Other Ambulatory Visit: Payer: Self-pay | Admitting: Internal Medicine

## 2013-09-04 ENCOUNTER — Encounter: Payer: Self-pay | Admitting: Internal Medicine

## 2013-09-04 ENCOUNTER — Ambulatory Visit: Payer: 59

## 2013-09-04 VITALS — BP 145/83 | HR 92 | Temp 98.0°F | Resp 18 | Ht 69.0 in | Wt 211.0 lb

## 2013-09-04 DIAGNOSIS — D3A012 Benign carcinoid tumor of the ileum: Secondary | ICD-10-CM

## 2013-09-04 DIAGNOSIS — C7A012 Malignant carcinoid tumor of the ileum: Secondary | ICD-10-CM

## 2013-09-04 LAB — COMPREHENSIVE METABOLIC PANEL (CC13)
ALT: 26 U/L (ref 0–55)
AST: 15 U/L (ref 5–34)
Albumin: 3.8 g/dL (ref 3.5–5.0)
Alkaline Phosphatase: 70 U/L (ref 40–150)
Anion Gap: 9 mEq/L (ref 3–11)
BUN: 9.4 mg/dL (ref 7.0–26.0)
CALCIUM: 9.8 mg/dL (ref 8.4–10.4)
CO2: 24 mEq/L (ref 22–29)
CREATININE: 1.2 mg/dL (ref 0.7–1.3)
Chloride: 109 mEq/L (ref 98–109)
Glucose: 106 mg/dl (ref 70–140)
Potassium: 3.8 mEq/L (ref 3.5–5.1)
Sodium: 142 mEq/L (ref 136–145)
TOTAL PROTEIN: 7.1 g/dL (ref 6.4–8.3)
Total Bilirubin: 0.36 mg/dL (ref 0.20–1.20)

## 2013-09-04 LAB — CBC WITH DIFFERENTIAL/PLATELET
BASO%: 0.5 % (ref 0.0–2.0)
BASOS ABS: 0 10*3/uL (ref 0.0–0.1)
EOS%: 3.5 % (ref 0.0–7.0)
Eosinophils Absolute: 0.3 10*3/uL (ref 0.0–0.5)
HCT: 42.1 % (ref 38.4–49.9)
HEMOGLOBIN: 13.8 g/dL (ref 13.0–17.1)
LYMPH#: 2.3 10*3/uL (ref 0.9–3.3)
LYMPH%: 32.6 % (ref 14.0–49.0)
MCH: 26.6 pg — ABNORMAL LOW (ref 27.2–33.4)
MCHC: 32.7 g/dL (ref 32.0–36.0)
MCV: 81.4 fL (ref 79.3–98.0)
MONO#: 0.6 10*3/uL (ref 0.1–0.9)
MONO%: 9 % (ref 0.0–14.0)
NEUT%: 54.4 % (ref 39.0–75.0)
NEUTROS ABS: 3.8 10*3/uL (ref 1.5–6.5)
Platelets: 311 10*3/uL (ref 140–400)
RBC: 5.17 10*6/uL (ref 4.20–5.82)
RDW: 15 % — ABNORMAL HIGH (ref 11.0–14.6)
WBC: 7.1 10*3/uL (ref 4.0–10.3)

## 2013-09-04 LAB — LACTATE DEHYDROGENASE (CC13): LDH: 136 U/L (ref 125–245)

## 2013-09-04 NOTE — Progress Notes (Signed)
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 Johney Maine.     He reports that in winter of 2012, he abdominal pains described as tense muscle spasms, sharp pains that were severe in quality.  He went to the emergency room at [facility] and he reports that he was told that he inflammation and provided antibiotics and ibuprofen. CT of abdomen on 10/25/2010 showed that there was mild inflammation surrounding the proximal jejunum near the ligament of Treitz, findings which could represent focal enteritis. He had later complained of some severe headaches causing an emergency room visit [facility] and he had a CT of head in January 2015 which showed no acute intracranial abnormality.   He notes that there was slow progression of his abdominal pain over time.  Eventually, due to it recurrence and progression, he presented to [facility] ER and CT of abdomen and pelvis was unrevealing on 05/05/2013.  Due to progressive abdominal pain and rectal bleeding, he was referred to [facility] (Dr. Hilarie Fredrickson).  He had a Meckels scan on 05/22/2013  and MRI (05/30/2013) and capsule endoscopy.  Meckel's scan was without scintigraphic evidence of ectopic gastric mucosa and demonstrated that there was a subtle round soft tissue mass within the right lower quadrant which was difficult to distinguish from the adjacent loops of small bowel. This small mass measured 2.1 x 2.4 cm on 10/25/2010 increased slowly to 2.8 x 2.7 cm on CT of 05/05/2013. Common differential for this small slow growing lesion would be a carcinoid  tumor or a gastrointestinal stromal tumor. No retractile pattern typical of carcinoid.    MRI of abdomen and pelvis on 05/30/2013 showed a diffusely enhacing 3.0 x 2.0 x 2.2 cm mass in the  new mesentery with close associated mesenteric vessels but the lesion did not appear to invade the small bowel.  As noted on the Mecke's can, top differential diagnostic consideration would be GIST or carcinoid tumor.  Lymphoma was not completely excluded. Of note, he also had several jejunal loops that were somewhat featureless which could be due to fold thickening or effacement.   Based on results of these studies, he was referred to to Surgery (Dr. Johney Maine).  He was admitted for mesenteric mass and present carcinoid and underwent laparoscopic assisted resection of ileum and ymph nodes on 08/12/2013.  There were no complications.  His pathology noted carcinoid with 3/6 lymph nodes positive.  Medical oncology consultation was requested as an outpatient.    Today, he reports fatigue and continued recovery from his recent surgery.  He still feels tenderness near the incision site. He reports intermittent spasms although they are not as severe.  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  He denies fevers or chills. He reports longstanding heartburn with reflux disease.  He saw Dr. Johney Maine for follow up last week with a subsequent follow up on the 27th if needed.  He does not have a primary care physician.  He reports that he had diarrhea and still continues with 5 loose stools daily.  He denies drenching sweating.  He  reports one episode of emesis one week following surgery.  He also notes nausea but reports that this is not as intense. He reports losing weight (nearly 8 lbs since the last few months). He reports that his uncle (paternal) had some type of cancer. His paternal aunt had breast cancer (in her fifties).    Otherwise, his family has diabetes and hypertension.   PAST MEDICAL HISTORY:  has a past medical history of  Anxiety; Diverticulitis; Gastroduodenitis; Hypertension; Depression; GERD (gastroesophageal reflux disease); and Blood in stool (5--31-2015).     PAST SURGICAL HISTORY: Past Surgical History  Procedure Laterality Date  . Colonoscopy N/A 05/09/2013    Procedure: COLONOSCOPY;  Surgeon: Irene Shipper, MD;  Location: [facility];  Service: Endoscopy;  Laterality: N/A;  . Esophagogastroduodenoscopy N/A 05/09/2013    Procedure: ESOPHAGOGASTRODUODENOSCOPY (EGD);  Surgeon: Irene Shipper, MD;  Location: [facility];  Service: Endoscopy;  Laterality: N/A;  possible egd depending on colon results  . Tonsillectomy  as child  . Laparoscopic appendectomy N/A 08/12/2013    Procedure: LAPAROSCOPIC ASSISTED APPENDECTOMY;  Surgeon: Adin Hector, MD;  Location: [facility];  Service: General;  Laterality: N/A;  . Bowel resection N/A 08/12/2013    Procedure: SMALL BOWEL ILEAL RESECTION;  Surgeon: Adin Hector, MD;  Location: [facility];  Service: General;  Laterality: N/A;     CURRENT MEDICATIONS: has a current medication list which includes the following prescription(s): hyoscyamine, oxycodone, naproxen sodium, and promethazine.   ALLERGIES: Review of patient's allergies indicates no known allergies.   SOCIAL HISTORY:  reports that he has been smoking Cigarettes.  He has a 5 pack-year smoking history. He has never used smokeless tobacco. He reports that he drinks alcohol. He reports that he uses illicit drugs (Marijuana).   FAMILY HISTORY: family history includes Anuerysm in his mother; Diabetes in his paternal aunt, paternal grandmother, and paternal uncle; Hypertension in his paternal aunt and paternal grandmother; Liver disease in his paternal uncle.   LABORATORY DATA:  Results for orders placed in visit on 09/04/13 (from the past 48 hour(s))  CBC WITH DIFFERENTIAL     Status: Abnormal   Collection Time    09/04/13  1:33 PM      Result Value Ref Range   WBC 7.1  4.0 - 10.3 10e3/uL   NEUT# 3.8  1.5 - 6.5  10e3/uL   HGB 13.8  13.0 - 17.1 g/dL   HCT 42.1  38.4 - 49.9 %   Platelets 311  140 - 400 10e3/uL   MCV 81.4  79.3 - 98.0 fL   MCH 26.6 (*) 27.2 - 33.4 pg   MCHC 32.7  32.0 - 36.0 g/dL   RBC 5.17  4.20 - 5.82 10e6/uL   RDW 15.0 (*) 11.0 - 14.6 %   lymph# 2.3  0.9 - 3.3 10e3/uL   MONO# 0.6  0.1 - 0.9 10e3/uL   Eosinophils Absolute 0.3  0.0 - 0.5 10e3/uL   Basophils Absolute 0.0  0.0 - 0.1 10e3/uL   NEUT% 54.4  39.0 - 75.0 %   LYMPH% 32.6  14.0 - 49.0 %   MONO% 9.0  0.0 - 14.0 %   EOS% 3.5  0.0 - 7.0 %   BASO% 0.5  0.0 - 2.0 %  COMPREHENSIVE METABOLIC PANEL (SE83)     Status: None   Collection Time    09/04/13  1:34 PM      Result Value Ref Range   Sodium 142  136 - 145 mEq/L  Potassium 3.8  3.5 - 5.1 mEq/L   Chloride 109  98 - 109 mEq/L   CO2 24  22 - 29 mEq/L   Glucose 106  70 - 140 mg/dl   BUN 9.4  7.0 - 26.0 mg/dL   Creatinine 1.2  0.7 - 1.3 mg/dL   Total Bilirubin 0.36  0.20 - 1.20 mg/dL   Alkaline Phosphatase 70  40 - 150 U/L   AST 15  5 - 34 U/L   ALT 26  0 - 55 U/L   Total Protein 7.1  6.4 - 8.3 g/dL   Albumin 3.8  3.5 - 5.0 g/dL   Calcium 9.8  8.4 - 10.4 mg/dL   Anion Gap 9  3 - 11 mEq/L  LACTATE DEHYDROGENASE (CC13)     Status: None   Collection Time    09/04/13  1:34 PM      Result Value Ref Range   LDH 136  125 - 245 U/L      RADIOGRAPHY: No results found.  PATHOLOGY:  Diagnosis 1. Appendix, Other than Incidental - BENIGN APPENDICEAL TISSUE WITH REACTIVE LYMPHOID HYPERPLASIA AND FIBROUS OBLITERANS. - NO EVIDENCE OF CARCINOID TUMOR. - NO EVIDENCE OF ACTIVE INFLAMMATION OR NEOPLASIA. 2. Small intestine, resection for tumor, ileal with ileal mass, mesenteric mass - WELL DIFFERENTIATED NEUROENDOCRINE TUMOR (CARCINOID TUMOR), INVADING THROUGH THE MUSCULARIS PROPRIA INTO SUBSEROSAL TISSUE. - ANGIOLYMPHATIC INVASION PRESENT. - THREE OF SIX LYMPH NODES, POSITIVE FOR CARCINOID TUMOR (3/6). - RESECTION MARGINS, NEGATIVE FOR ATYPIA OR MALIGNANCY. Microscopic  Comment 2. 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 Histologic Grade : G1, Low grade Mitotic Rate < 2/10 high-power fields (HIGH POWER FIELD), Ki-67: <3% Microscopic Tumor Extension: Invading through the muscularis propria into subserosal tissue. Margins: Negative Proximal Margin: 8 cm Distal Margin: 19.5 cm Mesenteric (Radial) Margin : 10 cm from the mucosa lesion and 0.1 cm from the positive mesenteric lymph node, please see comment for details. Distance to the closest margin: please see comment for details. Lymph-Vascular Invasion: Present Perineural Invasion: Not identified Lymph nodes: number examined 6; number positive: 3. TNM Staging: pT3, pN1 1 of 3 FINAL for MAVRIK, BYNUM ([id number]) Microscopic Comment(continued) Additional findings: N/A Comments: The mucosal mass in the ileum measures 1.5 cm. The tumor cells invade through the muscularis propria into the subserosal tissue without penetration of the inked serosa. Immunohistochemical stains were performed and Ki-67 rate is less than 3%. 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 The mucosa tumor is 10 cm from the mesenteric resection margin. The tumor in the partially replaced lymph node is 0.1 cm from the inked mesenteric margin. The case was discussed with Dr. Neysa Bonito on 08/13/2013. (HCL:kh 08/13/13) Aldona Bar MD Pathologist, Electronic Signature (Case signed 08/14/2013)   REVIEW OF SYSTEMS:  Constitutional: Denies fevers, chills or abnormal weight loss Eyes: Denies blurriness of vision Ears, nose, mouth, throat, and face: Denies mucositis or sore throat Respiratory: Denies cough, dyspnea or  wheezes Cardiovascular: Denies palpitation, chest discomfort or lower extremity swelling Gastrointestinal:  Denies nausea, heartburn; diarrhea as noted in HPI.  Skin: Denies abnormal skin rashes Lymphatics: Denies new lymphadenopathy or easy bruising Neurological:Denies numbness, tingling or new weaknesses Behavioral/Psych: Mood is stable, no new changes  All other systems were reviewed with the patient and  are negative.  PHYSICAL EXAM:  height is '5\' 9"'  (1.753 m) and weight is 211 lb (95.709 kg). His oral temperature is 98 F (36.7 C). His blood pressure is 145/83 and his pulse is 92. His respiration is 18.    GENERAL:alert, no distress and comfortable; anxious, well developed and well nourished.  SKIN: skin color, texture, turgor are normal, no rashes or significant lesions EYES: normal, Conjunctiva are pink and non-injected, sclera clear OROPHARYNX:no exudate, no erythema and lips, buccal mucosa, and tongue normal  NECK: supple, thyroid normal size, non-tender, without nodularity LYMPH:  no palpable lymphadenopathy in the cervical, axillary or inguinal LUNGS: clear to auscultation and percussion with normal breathing effort HEART: regular rate & rhythm and no murmurs and no lower extremity edema ABDOMEN:abdomen soft, non-tender and normal bowel sounds; abdominal mid-line incision well healed; TTP in the epigastric area.  Musculoskeletal:no cyanosis of digits and no clubbing  NEURO: alert & oriented x 3 with fluent speech, no focal motor/sensory deficits   IMPRESSION: Roczen Waymire is a 43 y.o. male with a history of    PLAN:  1.  Carcinoid of the Ileum s/p resection, Stage III (pT3,pN1) --We reviewed his history, images including his MRI of abdomen and Meckel's scan, pathology report as noted above.  Based on its location, nodal involvement but negative metastases, follow up with MRI of abdomen three months - 12 months s/p surgery is recommended per NCCN guidelines.  In addition, we  will measure chromogranin A levels and 5-HIAA.  If elevated, we will order scans sooner and/or if he experiences symptoms concerning for metastatic diseases, i.e., flushing or diarrhea.  His mucosal mass in the ileum measured 1.5 cm. The tumor cells invaded through the muscularis propria into the subserosal tissue without penetration of the inked serosa. Ki-67 rate was less than 3%. 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  The mucosa tumor was 10 cm from the mesenteric resection margin. We expressed to him given its low grade, well diffentiated with low Ki67, it has less risk of metastases.  However, carcinoids of the ileum and small bowel can recur years later and will require follow up for several years.  Generally, patients are followed up with tumor markers and imaging every 3-6 months for first two years; annually for the next 4 years; and then every 2 years for the next decade.  He was provided a handout about carcinoid of the ileum from the [employer].    2. Follow up.  --He will follow up in 2 months ( 3 months) from surgery with repeat chromogranin A and 5-HIAA and MRI of the abdomen.   All questions were answered. The patient knows to call the clinic with any problems, questions or concerns. We can certainly see the patient much sooner if necessary.  I spent 40 minutes counseling the patient face to face. The total time spent in the appointment was 60 minutes.    Saif Peter, MD 09/05/2013 12:26 PM

## 2013-09-04 NOTE — Progress Notes (Signed)
Checked in new patient with no financial issues prior to seeing the dr. He wants to be billed for copay. He [place] been out of the country and he has appt card.

## 2013-09-08 ENCOUNTER — Telehealth: Payer: Self-pay | Admitting: Internal Medicine

## 2013-09-08 NOTE — Telephone Encounter (Signed)
ft msg for pt per 09/03/13 POF labs/ov f/u 09/10 and mailed ltr KJ

## 2013-09-09 LAB — CHROMOGRANIN A: Chromogranin A: 3 ng/mL (ref 1.9–15.0)

## 2013-09-12 LAB — 5 HIAA, QUANTITATIVE, URINE, 24 HOUR
5-HIAA, 24 Hr Urine: 3.6 mg/24 h (ref <=6.0)
VOLUME, URINE-5HIAA: 1200 mL/(24.h)

## 2013-09-17 ENCOUNTER — Other Ambulatory Visit (INDEPENDENT_AMBULATORY_CARE_PROVIDER_SITE_OTHER): Payer: Self-pay | Admitting: Surgery

## 2013-09-17 ENCOUNTER — Encounter: Payer: Self-pay | Admitting: Internal Medicine

## 2013-09-17 NOTE — Telephone Encounter (Signed)
His followup will be with Dr. Juliann Mule and oncology will dictate plan of care going forward for carcinoid He can follow with [place] as needed or as symptoms warrant Screening colonoscopy recommended at age 43

## 2013-09-18 ENCOUNTER — Telehealth (INDEPENDENT_AMBULATORY_CARE_PROVIDER_SITE_OTHER): Payer: Self-pay

## 2013-09-18 DIAGNOSIS — Z8719 Personal history of other diseases of the digestive system: Secondary | ICD-10-CM

## 2013-09-18 DIAGNOSIS — D3A012 Benign carcinoid tumor of the ileum: Secondary | ICD-10-CM

## 2013-09-18 MED ORDER — OXYCODONE HCL 5 MG PO TABS: 5.0000 mg | ORAL_TABLET | ORAL | Status: DC | PRN

## 2013-09-18 NOTE — Telephone Encounter (Signed)
Informed pt of message below. Pt verbalized understanding  

## 2013-09-18 NOTE — Telephone Encounter (Signed)
Tried calling n/a can't leave message. I have a written rx for Oxycodone 5mg  #40 at front desk for p/u per Dr Johney Maine. I will reply in my chart for pt to p/u rx.

## 2013-09-29 ENCOUNTER — Encounter (INDEPENDENT_AMBULATORY_CARE_PROVIDER_SITE_OTHER): Payer: Self-pay | Admitting: Surgery

## 2013-09-29 ENCOUNTER — Encounter (INDEPENDENT_AMBULATORY_CARE_PROVIDER_SITE_OTHER): Payer: Self-pay

## 2013-09-29 ENCOUNTER — Ambulatory Visit (INDEPENDENT_AMBULATORY_CARE_PROVIDER_SITE_OTHER): Payer: 59 | Admitting: Surgery

## 2013-09-29 VITALS — BP 130/76 | HR 88 | Temp 98.0°F | Ht 68.0 in | Wt 216.0 lb

## 2013-09-29 DIAGNOSIS — Z8719 Personal history of other diseases of the digestive system: Secondary | ICD-10-CM

## 2013-09-29 DIAGNOSIS — A630 Anogenital (venereal) warts: Secondary | ICD-10-CM

## 2013-09-29 DIAGNOSIS — D3A012 Benign carcinoid tumor of the ileum: Secondary | ICD-10-CM

## 2013-09-29 MED ORDER — OXYCODONE HCL 5 MG PO TABS: 5.0000 mg | ORAL_TABLET | ORAL | Status: DC | PRN

## 2013-09-29 MED ORDER — NAPROXEN 500 MG PO TABS: 500.0000 mg | ORAL_TABLET | Freq: Two times a day (BID) | ORAL | Status: DC

## 2013-09-29 NOTE — Patient Instructions (Signed)
ABDOMINAL SURGERY: POST OP INSTRUCTIONS  1. DIET: Follow a light bland diet the first 24 hours after arrival home, such as soup, liquids, crackers, etc.  Be sure to include lots of fluids daily.  Avoid fast food or heavy meals as your are more likely to get nauseated.  Eat a low fat the next few days after surgery.   2. Take your usually prescribed home medications unless otherwise directed. 3. PAIN CONTROL: a. Pain is best controlled by a usual combination of three different methods TOGETHER: i. Ice/Heat ii. Over the counter pain medication iii. Prescription pain medication b. Most patients will experience some swelling and bruising around the incisions.  Ice packs or heating pads (30-60 minutes up to 6 times a day) will help. Use ice for the first few days to help decrease swelling and bruising, then switch to heat to help relax tight/sore spots and speed recovery.  Some people prefer to use ice alone, heat alone, alternating between ice & heat.  Experiment to what works for you.  Swelling and bruising can take several weeks to resolve.   c. It is helpful to take an over-the-counter pain medication regularly for the first few weeks.  Choose one of the following that works best for you: i. Naproxen (Aleve, etc)  Two 262m tabs twice a day ii. Ibuprofen (Advil, etc) Three 2066mtabs four times a day (every meal & bedtime) iii. Acetaminophen (Tylenol, etc) 500-65011mour times a day (every meal & bedtime) d. A  prescription for pain medication (such as oxycodone, hydrocodone, etc) should be given to you upon discharge.  Take your pain medication as prescribed.  i. If you are having problems/concerns with the prescription medicine (does not control pain, nausea, vomiting, rash, itching, etc), please call us [phone] see if we need to switch you to a different pain medicine that will work better for you and/or control your side effect better. ii. If you need a refill on your pain medication,  please contact your pharmacy.  They will contact our office to request authorization. Prescriptions will not be filled after 5 pm or on week-ends. 4. Avoid getting constipated.  Between the surgery and the pain medications, it is common to experience some constipation.  Increasing fluid intake and taking a fiber supplement (such as Metamucil, Citrucel, FiberCon, MiraLax, etc) 1-2 times a day regularly will usually help prevent this problem from occurring.  A mild laxative (prune juice, Milk of Magnesia, MiraLax, etc) should be taken according to package directions if there are no bowel movements after 48 hours.   5. Watch out for diarrhea.  If you have many loose bowel movements, simplify your diet to bland foods & liquids for a few days.  Stop any stool softeners and decrease your fiber supplement.  Switching to mild anti-diarrheal medications (Kayopectate, Pepto Bismol) can help.  If this worsens or does not improve, please call us.[place]. Wash / shower every day.  You may shower over the incision / wound.  Avoid baths until the skin is fully healed.  Continue to shower over incision(s) after the dressing is off. 7. Remove your waterproof bandages 5 days after surgery.  You may leave the incision open to air.  You may replace a dressing/Band-Aid to cover the incision for comfort if you wish. 8. ACTIVITIES as tolerated:   a. You may resume regular (light) daily activities beginning the next day-such as daily self-care, walking, climbing stairs-gradually increasing activities as tolerated.  If you can  walk 30 minutes without difficulty, it is safe to try more intense activity such as jogging, treadmill, bicycling, low-impact aerobics, swimming, etc. b. Save the most intensive and strenuous activity for last such as sit-ups, heavy lifting, contact sports, etc  Refrain from any heavy lifting or straining until you are off narcotics for pain control.   c. DO NOT PUSH THROUGH PAIN.  Let pain be your guide: If it  hurts to do something, don't do it.  Pain is your body warning you to avoid that activity for another week until the pain goes down. d. You may drive when you are no longer taking prescription pain medication, you can comfortably wear a seatbelt, and you can safely maneuver your car and apply brakes. e. Dennis Bast may have sexual intercourse when it is comfortable.  9. FOLLOW UP in our office a. Please call [facility] at [phone] to set up an appointment to see your surgeon in the office for a follow-up appointment approximately 1-2 weeks after your surgery. b. Make sure that you call for this appointment the day you arrive home to insure a convenient appointment time. 10. IF YOU HAVE DISABILITY OR FAMILY LEAVE FORMS, BRING THEM TO THE OFFICE FOR PROCESSING.  DO NOT GIVE THEM TO YOUR DOCTOR.   WHEN TO CALL us [phone]: 1. Poor pain control 2. Reactions / problems with new medications (rash/itching, nausea, etc)  3. Fever over 101.5 F (38.5 C) 4. Inability to urinate 5. Nausea and/or vomiting 6. Worsening swelling or bruising 7. Continued bleeding from incision. 8. Increased pain, redness, or drainage from the incision  The clinic staff is available to answer your questions during regular business hours (8:30am-5pm).  Please don't hesitate to call and ask to speak to one of our nurses for clinical concerns.   A surgeon from [facility] is always on call at the hospitals   If you have a medical emergency, go to the nearest emergency room or call 911.    [facility], [address], [place], [place], [place]  [address] ? MAIN: [phone] ? TOLL FREE: [phone] ? FAX [phone] [email or web address]  GETTING TO GOOD BOWEL HEALTH. Irregular bowel habits such as constipation and diarrhea can lead to many problems over time.  Having one soft bowel movement a day is the most important way to prevent further problems.  The anorectal canal  is designed to handle stretching and feces to safely manage our ability to get rid of solid waste (feces, poop, stool) out of our body.  BUT, hard constipated stools can act like ripping concrete bricks and diarrhea can be a burning fire to this very sensitive area of our body, causing inflamed hemorrhoids, anal fissures, increasing risk is perirectal abscesses, abdominal pain/bloating, an making irritable bowel worse.     The goal: ONE SOFT BOWEL MOVEMENT A DAY!  To have soft, regular bowel movements:    Drink at least 8 tall glasses of water a day.     Take plenty of fiber.  Fiber is the undigested part of plant food that passes into the colon, acting s "natures broom" to encourage bowel motility and movement.  Fiber can absorb and hold large amounts of water. This results in a larger, bulkier stool, which is soft and easier to pass. Work gradually over several weeks up to 6 servings a day of fiber (25g a day even more if needed) in the form of: o Vegetables -- Root (potatoes, carrots, turnips),  leafy green (lettuce, salad greens, celery, spinach), or cooked high residue (cabbage, broccoli, etc) o Fruit -- Fresh (unpeeled skin & pulp), Dried (prunes, apricots, cherries, etc ),  or stewed ( applesauce)  o Whole grain breads, pasta, etc (whole wheat)  o Bran cereals    Bulking Agents -- This type of water-retaining fiber generally is easily obtained each day by one of the following:  o Psyllium bran -- The psyllium plant is remarkable because its ground seeds can retain so much water. This product is available as Metamucil, Konsyl, Effersyllium, Per Diem Fiber, or the less expensive generic preparation in drug and health food stores. Although labeled a laxative, it really is not a laxative.  o Methylcellulose -- This is another fiber derived from wood which also retains water. It is available as Citrucel. o Polyethylene Glycol - and "artificial" fiber commonly called Miralax or Glycolax.  It is helpful  for people with gassy or bloated feelings with regular fiber o Flax Seed - a less gassy fiber than psyllium   No reading or other relaxing activity while on the toilet. If bowel movements take longer than 5 minutes, you are too constipated   AVOID CONSTIPATION.  High fiber and water intake usually takes care of this.  Sometimes a laxative is needed to stimulate more frequent bowel movements, but    Laxatives are not a good long-term solution as it can wear the colon out. o Osmotics (Milk of Magnesia, Fleets phosphosoda, Magnesium citrate, MiraLax, GoLytely) are safer than  o Stimulants (Senokot, Castor Oil, Dulcolax, Ex Lax)    o Do not take laxatives for more than 7days in a row.    IF SEVERELY CONSTIPATED, try a Bowel Retraining Program: o Do not use laxatives.  o Eat a diet high in roughage, such as bran cereals and leafy vegetables.  o Drink six (6) ounces of prune or apricot juice each morning.  o Eat two (2) large servings of stewed fruit each day.  o Take one (1) heaping tablespoon of a psyllium-based bulking agent twice a day. Use sugar-free sweetener when possible to avoid excessive calories.  o Eat a normal breakfast.  o Set aside 15 minutes after breakfast to sit on the toilet, but do not strain to have a bowel movement.  o If you do not have a bowel movement by the third day, use an enema and repeat the above steps.    Controlling diarrhea o Switch to liquids and simpler foods for a few days to avoid stressing your intestines further. o Avoid dairy products (especially milk & ice cream) for a short time.  The intestines often can lose the ability to digest lactose when stressed. o Avoid foods that cause gassiness or bloating.  Typical foods include beans and other legumes, cabbage, broccoli, and dairy foods.  Every person has some sensitivity to other foods, so listen to our body and avoid those foods that trigger problems for you. o Adding fiber (Citrucel, Metamucil, psyllium,  Miralax) gradually can help thicken stools by absorbing excess fluid and retrain the intestines to act more normally.  Slowly increase the dose over a few weeks.  Too much fiber too soon can backfire and cause cramping & bloating. o Probiotics (such as active yogurt, Align, etc) may help repopulate the intestines and colon with normal bacteria and calm down a sensitive digestive tract.  Most studies show it to be of mild help, though, and such products can be costly. o Medicines:   Bismuth  subsalicylate (ex. Kayopectate, Pepto Bismol) every 30 minutes for up to 6 doses can help control diarrhea.  Avoid if pregnant.   Loperamide (Immodium) can slow down diarrhea.  Start with two tablets (4mg  total) first and then try one tablet every 6 hours.  Avoid if you are having fevers or severe pain.  If you are not better or start feeling worse, stop all medicines and call your doctor for advice o Call your doctor if you are getting worse or not better.  Sometimes further testing (cultures, endoscopy, X-ray studies, bloodwork, etc) may be needed to help diagnose and treat the cause of the diarrhea.  STOP SMOKING!  We strongly recommend that you stop smoking.  Smoking increases the risk of surgery including infection in the form of an open wound, pus formation, abscess, hernia at an incision on the abdomen, etc.  You have an increased risk of other MAJOR complications such as stroke, heart attack, forming clots in the leg and/or lungs, and death.    Smoking Cessation Quitting smoking is important to your health and has many advantages. However, it is not always easy to quit since nicotine is a very addictive drug. Often times, people try 3 times or more before being able to quit. This document explains the best ways for you to prepare to quit smoking. Quitting takes hard work and a lot of effort, but you can do it. ADVANTAGES OF QUITTING SMOKING  You will live longer, feel better, and live better.  Your body  will feel the impact of quitting smoking almost immediately.  Within 20 minutes, blood pressure decreases. Your pulse returns to its normal level.  After 8 hours, carbon monoxide levels in the blood return to normal. Your oxygen level increases.  After 24 hours, the chance of having a heart attack starts to decrease. Your breath, hair, and body stop smelling like smoke.  After 48 hours, damaged nerve endings begin to recover. Your sense of taste and smell improve.  After 72 hours, the body is virtually free of nicotine. Your bronchial tubes relax and breathing becomes easier.  After 2 to 12 weeks, lungs can hold more air. Exercise becomes easier and circulation improves.  The risk of having a heart attack, stroke, cancer, or lung disease is greatly reduced.  After 1 year, the risk of coronary heart disease is cut in half.  After 5 years, the risk of stroke falls to the same as a nonsmoker.  After 10 years, the risk of lung cancer is cut in half and the risk of other cancers decreases significantly.  After 15 years, the risk of coronary heart disease drops, usually to the level of a nonsmoker.  If you are pregnant, quitting smoking will improve your chances of having a healthy baby.  The people you live with, especially any children, will be healthier.  You will have extra money to spend on things other than cigarettes. QUESTIONS TO THINK ABOUT BEFORE ATTEMPTING TO QUIT You may want to talk about your answers with your caregiver.  Why do you want to quit?  If you tried to quit in the past, what helped and what did not?  What will be the most difficult situations for you after you quit? How will you plan to handle them?  Who can help you through the tough times? Your family? Friends? A caregiver?  What pleasures do you get from smoking? What ways can you still get pleasure if you quit? Here are some questions to ask  your caregiver:  How can you help me to be successful at  quitting?  What medicine do you think would be best for me and how should I take it?  What should I do if I need more help?  What is smoking withdrawal like? How can I get information on withdrawal? GET READY  Set a quit date.  Change your environment by getting rid of all cigarettes, ashtrays, matches, and lighters in your home, car, or work. Do not let people smoke in your home.  Review your past attempts to quit. Think about what worked and what did not. GET SUPPORT AND ENCOURAGEMENT You have a better chance of being successful if you have help. You can get support in many ways.  Tell your family, friends, and co-workers that you are going to quit and need their support. Ask them not to smoke around you.  Get individual, group, or telephone counseling and support. Programs are available at [employer] and health centers. Call your local health department for information about programs in your area.  Spiritual beliefs and practices may help some smokers quit.  Download a "quit meter" on your computer to keep track of quit statistics, such as how long you have gone without smoking, cigarettes not smoked, and money saved.  Get a self-help book about quitting smoking and staying off of tobacco. Aristes yourself from urges to smoke. Talk to someone, go for a walk, or occupy your time with a task.  Change your normal routine. Take a different route to work. Drink tea instead of coffee. Eat breakfast in a different place.  Reduce your stress. Take a hot bath, exercise, or read a book.  Plan something enjoyable to do every day. Reward yourself for not smoking.  Explore interactive web-based programs that specialize in helping you quit. GET MEDICINE AND USE IT CORRECTLY Medicines can help you stop smoking and decrease the urge to smoke. Combining medicine with the above behavioral methods and support can greatly increase your chances of  successfully quitting smoking.  Nicotine replacement therapy helps deliver nicotine to your body without the negative effects and risks of smoking. Nicotine replacement therapy includes nicotine gum, lozenges, inhalers, nasal sprays, and skin patches. Some may be available over-the-counter and others require a prescription.  Antidepressant medicine helps people abstain from smoking, but how this works is unknown. This medicine is available by prescription.  Nicotinic receptor partial agonist medicine simulates the effect of nicotine in your brain. This medicine is available by prescription. Ask your caregiver for advice about which medicines to use and how to use them based on your health history. Your caregiver will tell you what side effects to look out for if you choose to be on a medicine or therapy. Carefully read the information on the package. Do not use any other product containing nicotine while using a nicotine replacement product.  RELAPSE OR DIFFICULT SITUATIONS Most relapses occur within the first 3 months after quitting. Do not be discouraged if you start smoking again. Remember, most people try several times before finally quitting. You may have symptoms of withdrawal because your body is used to nicotine. You may crave cigarettes, be irritable, feel very hungry, cough often, get headaches, or have difficulty concentrating. The withdrawal symptoms are only temporary. They are strongest when you first quit, but they will go away within 10 14 days. To reduce the chances of relapse, try to:  Avoid drinking alcohol. Drinking lowers your chances  of successfully quitting.  Reduce the amount of caffeine you consume. Once you quit smoking, the amount of caffeine in your body increases and can give you symptoms, such as a rapid heartbeat, sweating, and anxiety.  Avoid smokers because they can make you want to smoke.  Do not let weight gain distract you. Many smokers will gain weight when they  quit, usually less than 10 pounds. Eat a healthy diet and stay active. You can always lose the weight gained after you quit.  Find ways to improve your mood other than smoking. FOR MORE INFORMATION  www.smokefree.gov    While it can be one of the most difficult things to do, the [place] community has programs to help you stop.  Consider talking with your primary care physician about options.  Also, Smoking Cessation classes are available through the [facility]:  The smoking cessation program is a proven-effective program from the American Lung Association. The program is available for anyone 44 and older who currently smokes. The program lasts for 7 weeks and is 8 sessions. Each class will be approximately 1 1/2 hours. The program is every Tuesday.  All classes are 12-1:30pm and same location.  Event Location Information:  Location: [place] 2nd Floor Conference Room 2-037; located next to [facility] cross streets: [place] Entrance into the [facility] is adjacent to the [employer] main entrance. The conference room is located on the 2nd floor.  Parking Instructions: Visitor parking is adjacent to [employer] main entrance and the [facility]    A smoking cessation program is also offered through the [facility]. Register online at [email or web address] or call [phone] for more information.   Tobacco cessation counseling is available at [facility]. Call [phone] for a free appointment.   Tobacco cessation classes also are available through the [place] in [place]. For information, call [phone].   The Patient Education Network features videos on tobacco cessation. Please consult your listings in the center of this book to find instructions on how to access this resource.   If you want more information, ask your  nurse.  Genital Warts Genital warts are a sexually transmitted infection. They may appear as small bumps on the tissues of the genital area. CAUSES  Genital warts are caused by a virus called human papillomavirus (HPV). HPV is the most common sexually transmitted disease (STD) and infection of the sex organs. This infection is spread by having unprotected sex with an infected person. It can be spread by vaginal, anal, and oral sex. Many people do not know they are infected. They may be infected for years without problems. However, even if they do not have problems, they can unknowingly pass the infection to their sexual partners. SYMPTOMS   Itching and irritation in the genital area.  Warts that bleed.  Painful sexual intercourse. DIAGNOSIS  Warts are usually recognized with the naked eye on the vagina, vulva, perineum, anus, and rectum. Certain tests can also diagnose genital warts, such as:  A Pap test.  A tissue sample (biopsy) exam.  Colposcopy. A magnifying tool is used to examine the vagina and cervix. The HPV cells will change color when certain solutions are used. TREATMENT  Warts can be removed by:  Applying certain chemicals, such as cantharidin or podophyllin.  Liquid nitrogen freezing (cryotherapy).  Immunotherapy with Candida or Trichophyton injections.  Laser treatment.  Burning with an electrified probe (electrocautery).  Interferon injections.  Surgery.  Consider evaluation with [facility] to have them treated at some point [phone]  PREVENTION  HPV vaccination can help prevent HPV infections that cause genital warts and that cause cancer of the cervix. It is recommended that the vaccination be given to people between the ages 41 to 40 years old. The vaccine might not work as well or might not work at all if you already have HPV. It should not be given to pregnant women. HOME CARE INSTRUCTIONS   It is important to follow your caregiver's  instructions. The warts will not go away without treatment. Repeat treatments are often needed to get rid of warts. Even after it appears that the warts are gone, the normal tissue underneath often remains infected.  Do not try to treat genital warts with medicine used to treat hand warts. This type of medicine is strong and can burn the skin in the genital area, causing more damage.  Tell your past and current sexual partner(s) that you have genital warts. They may be infected also and need treatment.  Avoid sexual contact while being treated.  Do not touch or scratch the warts. The infection may spread to other parts of your body.  Women with genital warts should have a cervical cancer check (Pap test) at least once a year. This type of cancer is slow-growing and can be cured if found early. Chances of developing cervical cancer are increased with HPV.  Inform your obstetrician about your warts in the event of pregnancy. This virus can be passed to the baby's respiratory tract. Discuss this with your caregiver.  Use a condom during sexual intercourse. Following treatment, the use of condoms will help prevent reinfection.  Ask your caregiver about using over-the-counter anti-itch creams. SEEK MEDICAL CARE IF:   Your treated skin becomes red, swollen, or painful.  You have a fever.  You feel generally ill.  You feel little lumps in and around your genital area.  You are bleeding or have painful sexual intercourse. MAKE SURE YOU:   Understand these instructions.  Will watch your condition.  Will get help right away if you are not doing well or get worse. Document Released: 02/18/2000 Document Revised: 07/07/2013 Document Reviewed: 08/29/2010 [facility] Patient Information 2015 [place], [place]. This information is not intended to replace advice given to you by your health care provider. Make sure you discuss any questions you have with your health care provider.

## 2013-09-29 NOTE — Progress Notes (Signed)
Subjective:     Patient ID: Leslie Mcmillan, male   DOB: 03-17-1970, 43 y.o.   MRN: [id number]  HPI   Note: This dictation was prepared with Dragon/digital dictation along with [facility] technology. Any transcriptional errors that result from this process are unintentional.       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  He still has some discomfort when he lives on his stomach.  However he definitely feels like he is getting better.  He still gets some soreness in his right LQ radiates to his right testicle.  Less intense.  Wanting to get back to work.  Has some urgency to have a bowel movement after he eats.  Improving.  Appetite better.  No fevers or chills.  Moderate active.  He still smokes.  Trying to cut back.  Patient Active Problem List   Diagnosis Date Noted  . Condyloma acuminatum of penis 08/12/2013  . 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 Duodenitis without bleeding 05/09/2013  . Diverticulosis of colon without hemorrhage 05/09/2013  . Rectal bleeding 05/08/2013  . RLQ abdominal pain 05/08/2013    Past Medical History  Diagnosis Date  . Anxiety   . Diverticulitis   . Gastroduodenitis   . Hypertension     bp runs high sometimes, no bp meds  . Depression   . GERD  (gastroesophageal reflux disease)     no meds for  . Blood in stool 5--31-2015    Past Surgical History  Procedure Laterality Date  . Colonoscopy N/A 05/09/2013    Procedure: COLONOSCOPY;  Surgeon: Irene Shipper, MD;  Location: [facility];  Service: Endoscopy;  Laterality: N/A;  . Esophagogastroduodenoscopy N/A 05/09/2013    Procedure: ESOPHAGOGASTRODUODENOSCOPY (EGD);  Surgeon: Irene Shipper, MD;  Location: [facility];  Service: Endoscopy;  Laterality: N/A;  possible egd depending on colon results  . Tonsillectomy  as child  . Laparoscopic appendectomy N/A 08/12/2013    Procedure: LAPAROSCOPIC ASSISTED APPENDECTOMY;  Surgeon: Adin Hector, MD;  Location: [facility];  Service: General;  Laterality: N/A;  . Bowel resection N/A 08/12/2013    Procedure: SMALL BOWEL ILEAL RESECTION;  Surgeon: Adin Hector, MD;  Location: [facility];  Service: General;  Laterality: N/A;    History   Social History  . Marital Status: Single    Spouse Name: N/A    Number of Children: 6  . 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 Smoking status: Current Every Day Smoker -- 0.50 packs/day for 10 years    Types: Cigarettes  . Smokeless tobacco: Never Used  . Alcohol Use: Yes     Comment: 2-3 beers some days  . Drug Use: Yes    Special: Marijuana  . Sexual Activity: Not on file   Other Topics  Concern  . Not on file   Social History Narrative  . No narrative on file    Family History  Problem Relation Age of Onset  . Diabetes Paternal Grandmother   . Hypertension Paternal Grandmother   . Diabetes Paternal Uncle   . Diabetes Paternal Aunt   . Liver disease Paternal Uncle   . Hypertension Paternal Aunt   . Anuerysm Mother     brain    Current Outpatient Prescriptions  Medication Sig Dispense Refill  . naproxen sodium (ANAPROX) 220 MG tablet Take 220 mg by mouth 2 (two) times daily with a meal.      . oxyCODONE (OXY  IR/ROXICODONE) 5 MG immediate release tablet Take 1-2 tablets (5-10 mg total) by mouth every 4 (four) hours as needed for moderate pain, severe pain or breakthrough pain.  40 tablet  0   No current facility-administered medications for this visit.     No Known Allergies  BP 130/76  Pulse 88  Temp(Src) 98 F (36.7 C)  Ht $R'5\' 8"'nq$  (1.727 m)  Wt 216 lb (97.977 kg)  BMI 32.85 kg/m2  Dg Chest 2 View  08/04/2013   CLINICAL DATA:  43 year old male preoperative study for abdomen exploration, resection of mesenteric mass. Initial encounter.  EXAM: CHEST  2 VIEW  COMPARISON:  CT Abdomen and Pelvis 05/05/2013 and earlier.  FINDINGS: Normal cardiac size and mediastinal contours. Visualized tracheal air column is within normal limits. Lung volumes are within normal limits. No pneumothorax, pulmonary edema, pleural effusion, confluent pulmonary opacity, or pulmonary nodule/ mass. Visualized bowel gas pattern is non obstructed. No acute osseous abnormality identified.  IMPRESSION: No acute cardiopulmonary abnormality.   Electronically Signed   By: Lars Pinks M.D.   On: 08/04/2013 14:40     Review of Systems  Constitutional: Positive for appetite change. Negative for fever, chills and diaphoresis.  HENT: Negative for sore throat and trouble swallowing.   Eyes: Negative for photophobia and visual disturbance.  Respiratory: Negative for choking and shortness of breath.   Cardiovascular: Negative for chest pain and palpitations.  Gastrointestinal: Positive for blood in stool. Negative for nausea, vomiting, abdominal distention and rectal pain.  Genitourinary: Positive for testicular pain. Negative for dysuria, urgency and difficulty urinating.  Musculoskeletal: Negative for arthralgias, gait problem, myalgias and neck pain.  Skin: Negative for color change and rash.  Neurological: Negative for dizziness, speech difficulty, weakness and numbness.  Hematological: Negative for adenopathy.    Psychiatric/Behavioral: Negative for hallucinations, confusion and agitation.       Objective:   Physical Exam  Constitutional: He is oriented to person, place, and time. He appears well-developed and well-nourished. No distress.  HENT:  Head: Normocephalic.  Mouth/Throat: Oropharynx is clear and moist. No oropharyngeal exudate.  Eyes: Conjunctivae and EOM are normal. Pupils are equal, round, and reactive to light. No scleral icterus.  Neck: Normal range of motion. No tracheal deviation present.  Cardiovascular: Normal rate, normal heart sounds and intact distal pulses.   Pulmonary/Chest: Effort normal. No respiratory distress.  Abdominal: Soft. He exhibits no distension. There is no tenderness. There is no rigidity, no rebound, no guarding, no tenderness at McBurney's point and negative Murphy's sign. No hernia. Hernia confirmed negative in the ventral area, confirmed negative in the right inguinal area and confirmed negative in the left inguinal area.    Incisions clean with normal healing ridges.  No hernias  Musculoskeletal: Normal range of motion. He exhibits no tenderness.  Neurological: He is alert and oriented  to person, place, and time. No cranial nerve deficit. He exhibits normal muscle tone. Coordination normal.  Skin: Skin is warm and dry. No rash noted. He is not diaphoretic.  Psychiatric: He has a normal mood and affect. His behavior is normal.       Assessment:     Recovering status post excision of ileum containing carcinoid.  Positive lymph nodes.    Plan:     I think considering he is only a few weeks out from surgery, he is doing rather well.  Recs per medical oncology:   "--We reviewed his history, images including his MRI of abdomen and Meckel's scan, pathology report as noted above. Based on its location, nodal involvement but negative metastases, follow up with MRI of abdomen three months - 12 months s/p surgery is recommended per NCCN guidelines. In  addition, we will measure chromogranin A levels and 5-HIAA. If elevated, we will order scans sooner and/or if he experiences symptoms concerning for metastatic diseases, i.e., flushing or diarrhea. His mucosal mass in the ileum measured 1.5 cm. The tumor cells invaded through the muscularis propria into the subserosal tissue without penetration of the inked serosa. Ki-67 rate was less than 3%. 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 The mucosa tumor was 10 cm from the mesenteric resection margin. We expressed to him given its low grade, well diffentiated with low Ki67, it has less risk of metastases. However, carcinoids of the ileum and small bowel can recur years later and will require follow up for several years.   Generally, patients are followed up with tumor markers and imaging every 3-6 months for first two years; annually for the next 4 years; and then every 2 years for the next decade. He was provided a handout about carcinoid of the ileum from the [facility] website."    Abdominal pain and testicular radicular pain less & should continue to resolve over time.  Increase activity as tolerated to regular activity.  Low impact exercise such as walking an hour a day at least ideal.  Do not push through pain.  He wished for more oxycodone.  I obliged again.  I again recommended stressing anti-inflammatories.  Naproxen 1 more time.  Diet as tolerated.  Low fat high fiber diet ideal.  Bowel regimen with 30 g fiber a day and fiber supplement as needed to avoid problems.  Return to clinic 1 month, sooner as needed.  He is not feeling better RE fields worse, repeat blood work and CT scan to rule out problems.  Seems less likely.  STOP SMOKING! We talked to the patient about the dangers of smoking.  We stressed that tobacco use dramatically increases the risk of peri-operative complications such as  infection, tissue necrosis leaving to problems with incision/wound and organ healing, hernia, chronic pain, heart attack, stroke, DVT, pulmonary embolism, and death.  We noted there are programs in our community to help stop smoking.  Information was available.  I recommend he consider getting his condyloma removed from his penis.  He had that done in the 90s.  Does not have the money right now.  He will think about it.  Phone # to [facility] given.   Instructions discussed.  Followup with primary care physician for other health issues as would normally be done.  Consider screening for malignancies (breast, prostate, colon, melanoma, etc) as appropriate.  Questions answered.  The patient expressed understanding and  appreciation

## 2013-10-14 ENCOUNTER — Other Ambulatory Visit (INDEPENDENT_AMBULATORY_CARE_PROVIDER_SITE_OTHER): Payer: Self-pay | Admitting: Surgery

## 2013-10-14 DIAGNOSIS — D3A012 Benign carcinoid tumor of the ileum: Secondary | ICD-10-CM

## 2013-10-21 MED ORDER — OXYCODONE HCL 5 MG PO TABS: 5.0000 mg | ORAL_TABLET | ORAL | Status: DC | PRN

## 2013-10-21 NOTE — Telephone Encounter (Signed)
[facility] notifying pt that there is a written rx for Oxycodone 5mg  #40 ready for p/u at the front desk. No more refills until pt see's Dr Johney Maine on 10/29/13.

## 2013-10-29 ENCOUNTER — Ambulatory Visit (INDEPENDENT_AMBULATORY_CARE_PROVIDER_SITE_OTHER): Payer: 59 | Admitting: Surgery

## 2013-10-29 ENCOUNTER — Encounter (INDEPENDENT_AMBULATORY_CARE_PROVIDER_SITE_OTHER): Payer: Self-pay | Admitting: Surgery

## 2013-10-29 ENCOUNTER — Encounter (INDEPENDENT_AMBULATORY_CARE_PROVIDER_SITE_OTHER): Payer: Self-pay

## 2013-10-29 VITALS — BP 122/80 | HR 77 | Temp 98.4°F | Ht 69.0 in | Wt 224.0 lb

## 2013-10-29 DIAGNOSIS — D3A012 Benign carcinoid tumor of the ileum: Secondary | ICD-10-CM

## 2013-10-29 DIAGNOSIS — A630 Anogenital (venereal) warts: Secondary | ICD-10-CM

## 2013-10-29 NOTE — Patient Instructions (Signed)
STOP SMOKING!  We strongly recommend that you stop smoking. Smoking increases the risk of surgery including infection in the form of an open wound, pus formation, abscess, hernia at an incision on the abdomen, etc. You have an increased risk of other MAJOR complications such as stroke, heart attack, forming clots in the leg and/or lungs, and death.  Smoking Cessation  Quitting smoking is important to your health and has many advantages. However, it is not always easy to quit since nicotine is a very addictive drug. Often times, people try 3 times or more before being able to quit. This document explains the best ways for you to prepare to quit smoking. Quitting takes hard work and a lot of effort, but you can do it.  ADVANTAGES OF QUITTING SMOKING  You will live longer, feel better, and live better.  Your body will feel the impact of quitting smoking almost immediately.  Within 20 minutes, blood pressure decreases. Your pulse returns to its normal level.  After 8 hours, carbon monoxide levels in the blood return to normal. Your oxygen level increases.  After 24 hours, the chance of having a heart attack starts to decrease. Your breath, hair, and body stop smelling like smoke.  After 48 hours, damaged nerve endings begin to recover. Your sense of taste and smell improve.  After 72 hours, the body is virtually free of nicotine. Your bronchial tubes relax and breathing becomes easier.  After 2 to 12 weeks, lungs can hold more air. Exercise becomes easier and circulation improves.  The risk of having a heart attack, stroke, cancer, or lung disease is greatly reduced.  After 1 year, the risk of coronary heart disease is cut in half.  After 5 years, the risk of stroke falls to the same as a nonsmoker.  After 10 years, the risk of lung cancer is cut in half and the risk of other cancers decreases significantly.  After 15 years, the risk of coronary heart disease drops, usually to the level of a nonsmoker.    If you are pregnant, quitting smoking will improve your chances of having a healthy baby.  The people you live with, especially any children, will be healthier.  You will have extra money to spend on things other than cigarettes. QUESTIONS TO THINK ABOUT BEFORE ATTEMPTING TO QUIT  You may want to talk about your answers with your caregiver.  Why do you want to quit?  If you tried to quit in the past, what helped and what did not?  What will be the most difficult situations for you after you quit? How will you plan to handle them?  Who can help you through the tough times? Your family? Friends? A caregiver?  What pleasures do you get from smoking? What ways can you still get pleasure if you quit? Here are some questions to ask your caregiver:  How can you help me to be successful at quitting?  What medicine do you think would be best for me and how should I take it?  What should I do if I need more help?  What is smoking withdrawal like? How can I get information on withdrawal? GET READY  Set a quit date.  Change your environment by getting rid of all cigarettes, ashtrays, matches, and lighters in your home, car, or work. Do not let people smoke in your home.  Review your past attempts to quit. Think about what worked and what did not. GET SUPPORT AND ENCOURAGEMENT  You have  a better chance of being successful if you have help. You can get support in many ways.  Tell your family, friends, and co-workers that you are going to quit and need their support. Ask them not to smoke around you.  Get individual, group, or telephone counseling and support. Programs are available at [employer] and health centers. Call your local health department for information about programs in your area.  Spiritual beliefs and practices may help some smokers quit.  Download a "quit meter" on your computer to keep track of quit statistics, such as how long you have gone without smoking, cigarettes not smoked,  and money saved.  Get a self-help book about quitting smoking and staying off of tobacco. [place] yourself from urges to smoke. Talk to someone, go for a walk, or occupy your time with a task.  Change your normal routine. Take a different route to work. Drink tea instead of coffee. Eat breakfast in a different place.  Reduce your stress. Take a hot bath, exercise, or read a book.  Plan something enjoyable to do every day. Reward yourself for not smoking.  Explore interactive web-based programs that specialize in helping you quit. GET MEDICINE AND USE IT CORRECTLY  Medicines can help you stop smoking and decrease the urge to smoke. Combining medicine with the above behavioral methods and support can greatly increase your chances of successfully quitting smoking.  Nicotine replacement therapy helps deliver nicotine to your body without the negative effects and risks of smoking. Nicotine replacement therapy includes nicotine gum, lozenges, inhalers, nasal sprays, and skin patches. Some may be available over-the-counter and others require a prescription.  Antidepressant medicine helps people abstain from smoking, but how this works is unknown. This medicine is available by prescription.  Nicotinic receptor partial agonist medicine simulates the effect of nicotine in your brain. This medicine is available by prescription. Ask your caregiver for advice about which medicines to use and how to use them based on your health history. Your caregiver will tell you what side effects to look out for if you choose to be on a medicine or therapy. Carefully read the information on the package. Do not use any other product containing nicotine while using a nicotine replacement product.  RELAPSE OR DIFFICULT SITUATIONS  Most relapses occur within the first 3 months after quitting. Do not be discouraged if you start smoking again. Remember, most people try several times before finally  quitting. You may have symptoms of withdrawal because your body is used to nicotine. You may crave cigarettes, be irritable, feel very hungry, cough often, get headaches, or have difficulty concentrating. The withdrawal symptoms are only temporary. They are strongest when you first quit, but they will go away within 10 14 days.  To reduce the chances of relapse, try to:  Avoid drinking alcohol. Drinking lowers your chances of successfully quitting.  Reduce the amount of caffeine you consume. Once you quit smoking, the amount of caffeine in your body increases and can give you symptoms, such as a rapid heartbeat, sweating, and anxiety.  Avoid smokers because they can make you want to smoke.  Do not let weight gain distract you. Many smokers will gain weight when they quit, usually less than 10 pounds. Eat a healthy diet and stay active. You can always lose the weight gained after you quit.  Find ways to improve your mood other than smoking. FOR MORE INFORMATION  www.smokefree.gov  While it can be one  of the most difficult things to do, the [place] community has programs to help you stop. Consider talking with your primary care physician about options. Also, Smoking Cessation classes are available through the [facility]:  The smoking cessation program is a proven-effective program from the American Lung Association. The program is available for anyone 93 and older who currently smokes. The program lasts for 7 weeks and is 8 sessions. Each class will be approximately 1 1/2 hours. The program is every Tuesday. All classes are 12-1:30pm and same location.  Event Location Information:  Location: [facility] 2nd Floor Conference Room 2-037; located next to [facility] cross streets: [place] Entrance into the [facility] is adjacent to the [employer] main entrance. The conference room is located on the 2nd floor.    Parking Instructions: Visitor parking is adjacent to [employer] main entrance and the [place]   1. A smoking cessation program is also offered through the [facility]. Register online at [email or web address] or call [phone] for more information.  2. Tobacco cessation counseling is available at [facility]. Call [phone] for a free appointment.  3. Tobacco cessation classes also are available through the [place] [facility] [place]. For information, call [phone].  4. The Patient Education Network features videos on tobacco cessation. Please consult your listings in the center of this book to find instructions on how to access this resource.  5. If you want more information, ask your nurse. Genital Warts    Genital warts are a sexually transmitted infection. They may appear as small bumps on the tissues of the genital area.  CAUSES  Genital warts are caused by a virus called human papillomavirus (HPV). HPV is the most common sexually transmitted disease (STD) and infection of the sex organs. This infection is spread by having unprotected sex with an infected person. It can be spread by vaginal, anal, and oral sex. Many people do not know they are infected. They may be infected for years without problems. However, even if they do not have problems, they can unknowingly pass the infection to their sexual partners.  SYMPTOMS  Itching and irritation in the genital area.  Warts that bleed.  Painful sexual intercourse. DIAGNOSIS  Warts are usually recognized with the naked eye on the vagina, vulva, perineum, anus, and rectum. Certain tests can also diagnose genital warts, such as:  A Pap test.  A tissue sample (biopsy) exam.  Colposcopy. A magnifying tool is used to examine the vagina and cervix. The HPV cells will change color when certain solutions are used. TREATMENT  Warts can be removed by:  Applying certain chemicals,  such as cantharidin or podophyllin.  Liquid nitrogen freezing (cryotherapy).  Immunotherapy with Candida or Trichophyton injections.  Laser treatment.  Burning with an electrified probe (electrocautery).  Interferon injections.  Surgery. Consider evaluation with [facility] to have them treated at some point [phone]  PREVENTION  HPV vaccination can help prevent HPV infections that cause genital warts and that cause cancer of the cervix. It is recommended that the vaccination be given to people between the ages 50 to 78 years old. The vaccine might not work as well or might not work at all if you already have HPV. It should not be given to pregnant women.  HOME CARE INSTRUCTIONS  It is important to follow your caregiver's instructions. The warts will not go  away without treatment. Repeat treatments are often needed to get rid of warts. Even after it appears that the warts are gone, the normal tissue underneath often remains infected.  Do not try to treat genital warts with medicine used to treat hand warts. This type of medicine is strong and can burn the skin in the genital area, causing more damage.  Tell your past and current sexual partner(s) that you have genital warts. They may be infected also and need treatment.  Avoid sexual contact while being treated.  Do not touch or scratch the warts. The infection may spread to other parts of your body.  Women with genital warts should have a cervical cancer check (Pap test) at least once a year. This type of cancer is slow-growing and can be cured if found early. Chances of developing cervical cancer are increased with HPV.  Inform your obstetrician about your warts in the event of pregnancy. This virus can be passed to the baby's respiratory tract. Discuss this with your caregiver.  Use a condom during sexual intercourse. Following treatment, the use of condoms will help prevent reinfection.  Ask your caregiver about using over-the-counter  anti-itch creams. SEEK MEDICAL CARE IF:  Your treated skin becomes red, swollen, or painful.  You have a fever.  You feel generally ill.  You feel little lumps in and around your genital area.  You are bleeding or have painful sexual intercourse. MAKE SURE YOU:  Understand these instructions.  Will watch your condition.  Will get help right away if you are not doing well or get worse. Document Released: 02/18/2000 Document Revised: 07/07/2013 Document Reviewed: 08/29/2010  [facility] Patient Information 2015 [employer], [place]. This information is not intended to replace advice given to you by your health care provider. Make sure you discuss any questions you have with your health care provider.  Managing Pain  Pain after surgery or related to activity is often due to strain/injury to muscle, tendon, nerves and/or incisions.  This pain is usually short-term and will improve in a few months.   Many people find it helpful to do the following things TOGETHER to help speed the process of healing and to get back to regular activity more quickly:  1. Avoid heavy physical activity a.  no lifting greater than 20 pounds b. Do not "push through" the pain.  Listen to your body and avoid positions and maneuvers than reproduce the pain c. Walking is okay as tolerated, but go slowly and stop when getting sore.  d. Remember: If it hurts to do it, then don't do it! 2. Take Anti-inflammatory medication  a. Take with food/snack around the clock for 1-2 weeks i. This helps the muscle and nerve tissues become less irritable and calm down faster b. Choose ONE of the following over-the-counter medications: i. Naproxen 220mg  tabs (ex. Aleve) 1-2 pills twice a day  ii. Ibuprofen 200mg  tabs (ex. Advil, Motrin) 3-4 pills with every meal and just before bedtime iii. Acetaminophen 500mg  tabs (Tylenol) 1-2 pills with every meal and just before bedtime 3. Use a Heating pad or Ice/Cold Pack a. 4-6 times a day b. May  use warm bath/hottub  or showers 4. Try Gentle Massage and/or Stretching  a. at the area of pain many times a day b. stop if you feel pain - do not overdo it  Try these steps together to help you body heal faster and avoid making things get worse.  Doing just one of these things may not be  enough.    If you are not getting better after two weeks or are noticing you are getting worse, contact our office for further advice; we may need to re-evaluate you & see what other things we can do to help.  Carcinoid Tumor: Overview Approved by the Cancer.Net Editorial Board, 07/2013  ON THIS PAGE: You will find some basic information about this disease and the parts of the body it may affect. This is the first page of Cancer.Net's Guide to Carcinoid Tumors. To see other pages, use the menu on the side of your screen. Think of that menu as a roadmap to this full guide. About the endocrine system The body's endocrine system is made up of cells that produce hormones. Hormones are chemical substances that are carried through the bloodstream to have a specific effect on the activity of other organs or cells in the body. About endocrine and neuroendocrine tumors A tumor begins when healthy cells change and grow uncontrollably, forming a mass. A tumor can be cancerous or benign. A cancerous tumor is malignant, meaning it can grow and spread to other parts of the body if it is not found early and treated. A benign tumor means the tumor can grow but will not spread. A benign tumor usually can be removed without it causing much harm. An endocrine tumor is a mass that begins in the parts of the body that produce and release hormones. Because an endocrine tumor develops from cells that produce hormones, the tumor can also produce hormones, causing serious illness. A neuroendocrine tumor begins in the hormone-producing cells of the body's neuroendocrine system, which is made up of cells that are a combination of  hormone-producing endocrine cells and nerve cells. Neuroendocrine cells are found throughout the body in organs such as the lungs and gastrointestinal (GI) tract, including the stomach and intestines. Neuroendocrine cells perform specific functions, such as regulating air and blood flow through the lungs and controlling the speed at which food moves through the GI tract. About carcinoid tumors A carcinoid tumor is a specific type of neuroendocrine tumor. Carcinoid tumors most often develop in the GI tract, in organs such as the stomach or intestines, or in the lungs. However, a carcinoid tumor can also develop in the pancreas, a man's testicles, or a woman's ovaries. More than one carcinoid tumor can develop in the same organ. Here is a general overview of where carcinoid tumors begin: 39% occur in the small intestine. 15% occur in the rectum. 10% occur in the bronchial system of the lungs. 7% occur in the appendix. 5% to 7% occur in the colon. 2% to 4% occur in the stomach. 2% to 3% occur in the pancreas. About 1% occurs in the liver. Carcinoid tumors rarely occur in the ovaries, testicles, and other organs. Because carcinoid tumors develop from neuroendocrine cells, they can make high levels of neuropeptides and amines, which are hormone-like substances. However, these substances may not be released in large enough amounts to cause symptoms, or the substances may be defective and not cause symptoms. As a result, a carcinoid tumor can grow slowly for many years without causing symptoms. Although a carcinoid tumor is cancerous, it is often described as "cancer in slow motion." Types of lung carcinoid tumors There are two types of lung carcinoid tumors: typical and atypical. The difference is based on how a tumor processes and makes serotonin (5-HT). Serotonin is a neurotransmitter involved in behavior and depression. Typical. A typical lung carcinoid tumor produces high levels  of serotonin and  chromogranin-A that can be measured in the blood. Typical lung carcinoid tumors also produce high levels of 5-HIAA, a product of serotonin breakdown, that can be measured in the urine. Atypical. People with an atypical lung carcinoid tumor have normal levels of serotonin and chromogranin-A in their blood and normal levels of 5-HIAA in their urine. However, high levels of serotonin and the amino acid 5-HTP can be found in the urine, as atypical lung carcinoid tumors can make 5-HTP.  Carcinoid Tumor: Treatment Options Approved by the Cancer.Net Editorial Board, 07/2013  ON THIS PAGE: You will learn about the different ways doctors use to treat a carcinoid tumor. To see other pages, use the menu on the side of your screen. This section outlines treatments that are the standard of care (the best known treatments available) for this specific type of tumor. When making treatment plan decisions, patients are also encouraged to consider clinical trials as an option. A clinical trial is a research study to test a new approach to treatment to evaluate whether it is safe, effective, and possibly better than the standard treatment. Clinical trials may test such approaches as a new drug, a new combination of standard treatments, or new doses of current therapies. Your doctor can help you review all of your treatment options. For more information, see the About Clinical Trials and Latest Research sections.  Treatment overview In cancer care, different types of doctors often work together to create a patient's overall treatment plan that combines different types of treatments. This is called a multidisciplinary team. Cancer care teams also include a variety of other health care professionals, including physician assistants, oncology nurses, social workers, pharmacists, counselors, dietitians, and others. Descriptions of the most common treatment options for a carcinoid tumor are listed below, followed by an outline of  treatment options by stage for a GI carcinoid tumor. Treatment options and recommendations depend on several factors, including the type and stage of cancer, possible side effects, and the patient's preferences and overall health. Your care plan may also include treatment for symptoms and side effects, an important part of cancer care. Take time to learn about all of your treatment options and be sure to ask questions about things that are unclear. Also, talk about the goals of each treatment with your doctor and what you can expect while receiving the treatment. Learn more about making treatment decisions. Surgery Surgery is the removal of the tumor and some surrounding healthy tissue during an operation. A surgical oncologist is a doctor who specializes in cancer surgery. Completely removing the entire tumor is the standard treatment when possible, and many carcinoid tumors are successfully treated with surgery alone. The surgeon will usually remove some tissue surrounding the tumor in hopes of leaving negative margins, which means there are no traces of cancer found in the healthy tissue. When completely removing the tumor is not possible, debulking surgery is often recommended. Debulking surgery removes as much of the tumor as possible and may provide some relief from symptoms. People who have developed carcinoid syndrome are at risk of experiencing a carcinoid crisis during surgery (see the Signs and Symptoms section for more information). To avoid major complications from a carcinoid crisis, it is important that the anesthesiology team is fully aware of this risk ahead of time so they can have octreotide on hand to control the symptoms. Talk with your health care team about the possible side effects of the specific operation beforehand and how they will be  relieved or managed. Learn more about the basics of cancer surgery. Surgical options for a GI carcinoid tumor Local excision. During this operation,  the surgeon removes the tumor and some surrounding healthy tissue. Most localized tumors can be surgically removed through a skin incision, but a rectal carcinoid tumor may be removed through the anus. Other GI carcinoid tumors can sometimes be removed using an endoscope (see the Diagnosis section).  Electro-fulguration, also called radiofrequency ablation (RFA). This treatment destroys a tumor by heating it with an electric current. It is sometimes recommended for rectal carcinoid tumors.  Segmental colon resection or hemicolectomy. During this surgery, one-third to one-half of the colon, as well as nearby blood vessels and lymph nodes, are removed.  Low anterior resection. During this surgery, a portion of the upper part of the rectum is removed.  Abdominoperineal resection. This surgery is used for a large tumor located in the lower part of the rectum. During the operation, the surgeon removes the anus, rectum, and part of the colon. After surgery, a colostomy may be created to carry waste out of the body. A colostomy is an opening from the colon to the outside of the body. Procedures to treat cancer that has spread to the liver Liver resection. This operation removes areas of metastases from the liver. The goal of this surgery is not to eliminate the cancer, but it often helps relieve or reduce the symptoms of carcinoid syndrome. Liver transplantation. Liver transplantation is rarely used as a treatment for carcinoid tumor, but it may help younger patients with a carcinoid tumor that began in the liver. Intratumoral ethanol injection. During this procedure, a CT scan is used to guide a needle into the areas of tumor spread. These areas are then destroyed by injecting concentrated alcohol through the needle. Liquid nitrogen can also be used to cool the needle and destroy the cells by freezing. These methods are particularly useful if traditional surgical procedures are difficult or impossible to  perform. Hepatic artery occlusion or embolization. These procedures block the tumor's blood supply by sealing off the blood vessels leading to the tumor. The effectiveness of chemotherapy combined with hepatic artery embolization is not yet clear and continues to be studied. This treatment can cause severe side effects and is only considered when other treatment options are not working. Surgical options for a lung carcinoid tumor Sleeve resection. The tumor is surgically removed along with parts of the airway above and below the tumor. The airway is then reconnected. Wedge resection. During this surgery, a small, wedge-shaped piece of the lung is removed. This surgery is used if the tumor is very small. Lobectomy. During this surgery, an entire lobe of a lung is removed. This surgery is often used if a sleeve resection is not possible because of the size and/or location of the tumor. A lobectomy is also used if a carcinoid tumor is found at the edge of the lung, away from large airways. Pneumonectomy. During this operation, an entire lung is removed. Lymph node dissection. Removal of the lymph nodes near the lungs is also common during surgery for a lung carcinoid tumor. Removing the lymph nodes reduces the risk of the carcinoid tumor spreading to other organs. Radiation therapy Radiation therapy is the use of high-energy x-rays or other particles to destroy cancer cells. A radiation therapy regimen (schedule) usually consists of a specific number of treatments given over a set period of time. A doctor who specializes in giving radiation therapy to treat cancer is  called a radiation oncologist. The most common type of radiation treatment for a carcinoid tumor is called external-beam radiation therapy, which is radiation given from a machine outside the body. It is most often used to relieve symptoms, such as pain, caused by cancer that has spread to the bone as part of palliative care (see below). A  second type of radiation treatment is internal radiation or brachytherapy. Internal radiation therapy is the use of tiny pellets or rods containing radioactive materials that are surgically implanted in or near the site of the tumor. The implant is left in place for several days while the patient stays in the hospital. Patients receiving radiation therapy may experience fatigue during treatment, and the treated area may become red and dry. Radiation therapy to the chest or neck may cause a dry, sore throat or a dry cough. Some patients have shortness of breath during radiation therapy. Most side effects go away after the treatment is finished. Learn more about the basics of radiation therapy. Chemotherapy Chemotherapy is the use of drugs to destroy cancer cells, usually by stopping the cancer cells' ability to grow and divide. Chemotherapy is given by a medical oncologist, a doctor who specializes in treating cancer with medication. Systemic chemotherapy gets into the bloodstream to reach cancer cells throughout the body. Common ways to give chemotherapy include an intravenous (IV) tube placed into a vein using a needle or in a pill or capsule that is swallowed (orally). A chemotherapy regimen (schedule) usually consists of a specific number of cycles given over a set period of time. A patient may receive one drug at a time or combinations of different drugs at the same time. Chemotherapy for a carcinoid tumor is most often used when the tumor has spread to other organs or is causing severe symptoms. A carcinoid tumor usually does not respond to chemotherapy alone, and other treatments may be necessary. The side effects of chemotherapy depend on the individual and the dose used, but they can include fatigue, risk of infection, nausea and vomiting, hair loss, loss of appetite, and diarrhea. These side effects usually go away once treatment is finished. Learn more about the basics of chemotherapy and preparing  for treatment. The medications used to treat cancer are continually being evaluated. Talking with your doctor is often the best way to learn about the medications prescribed for you, their purpose, and their potential side effects or interactions with other medications. Learn more about your prescriptions by using searchable drug databases. Immunotherapy Immunotherapy, also called biologic therapy, is designed to boost the body's natural defenses to fight the cancer. It uses materials made either by the body or in a laboratory to improve, target, or restore immune system function. Immunotherapy, such as alpha-interferon and octreotide, may shrink a carcinoid tumor or stop its growth. These treatments work by changing the surface proteins of cancer cells and by slowing their growth. Biologic therapies are also used to treat symptoms by controlling the production of hormones. Learn more about the basics of immunotherapy. Targeted therapy Targeted therapy is a treatment that targets the cancer's specific genes, proteins, or the tissue environment that contributes to cancer growth and survival. This type of treatment blocks the growth and spread of cancer cells while limiting damage to healthy cells.  Recent studies show that not all tumors have the same targets. To find the most effective treatment, your doctor may run tests to identify the genes, proteins, and other factors in your tumor. This helps doctors better match  each patient with the most effective treatment whenever possible. In addition, many research studies are taking place now to find out more about specific molecular targets and new treatments directed at them. Learn more about the basics of targeted treatments. For carcinoid tumors, targeted therapies being researched in clinical trials include drugs that interfere with new blood vessel formation or with specific survival pathways of cancer cells. Research continues on these and other approaches.  Talk with your doctor about possible side effects for a specific medication and how they can be managed. Getting care for symptoms and side effects Cancer and its treatment often cause side effects. In addition to treatment to slow, stop, or eliminate the cancer, an important part of cancer care is relieving a person's symptoms and side effects. This approach is called palliative or supportive care, and it includes supporting the patient with his or her physical, emotional, and social needs. Palliative care is any treatment that focuses on reducing symptoms, improving quality of life, and supporting patients and their families. Any person, regardless of age or type and stage of cancer, may receive palliative care. It works best when palliative care is started as early as needed in the cancer treatment process. People often receive treatment for the cancer and treatment to ease side effects at the same time. In fact, patients who receive both often have less severe symptoms, better quality of life, and report they are more satisfied with treatment. Palliative treatments vary widely and often include medication, nutritional changes, relaxation techniques, emotional support, and other therapies. You may also receive palliative treatments similar to those meant to eliminate the cancer, such as chemotherapy, surgery, or radiation therapy. Talk with your doctor about the goals of each treatment in your treatment plan. For some people, such as those with lung disease, heart disease, or other specific medical conditions, surgery cannot successfully treat the cancer. In these cases, palliative surgery to relieve symptoms may be helpful. This may include removing most of the tumor through a bronchoscope or vaporizing large portions of the tumor with a laser. Palliative surgery is often used together with radiation therapy. These treatments can help relieve symptoms caused by blocked airways. Before treatment begins,  talk with your health care team about the possible side effects of your specific treatment plan and palliative care options. And during and after treatment, be sure to tell your doctor or another health care team member if you are experiencing a problem so it can be addressed as quickly as possible. Learn more about palliative care. Treatment of a GI carcinoid tumor by stage Localized  Stomach. A localized carcinoid tumor of the stomach can often be completely removed through an endoscope. A tumor larger than 2 centimeters (cm) is removed with a margin of surrounding stomach tissue through an incision in the abdomen. For patients whose carcinoid tumor is stimulated by gastrin, a hormone released by cells of the antrum of the stomach (the part next to the small intestine), the removal of the antrum may be recommended. Small intestine. Surgery to remove the tumor and surrounding tissue is the most common treatment for a carcinoid tumor smaller than 1 cm located in the small intestine. Surgery for a larger tumor involves removing more surrounding tissue, as well as some surrounding blood vessels and lymph nodes. Large intestine. The most common treatment for a tumor smaller than 2 cm is the surgical removal of the tumor and surrounding tissue, often done through a colonoscope (see the Diagnosis section). If the tumor  is larger than 2 cm, surgery most often involves an incision through the skin. Appendix. An appendectomy is usually the only treatment needed for a carcinoid tumor smaller than 1.5 cm. For a tumor larger than 2 cm, the removal of about one-third of the colon next to the appendix, along with nearby blood vessels and lymph nodes, is often needed. Rectum. A rectal carcinoid tumor smaller than 1 cm is treated with electro-fulguration (see above). A tumor larger than 2 cm is more likely to grow and spread quickly. These larger tumors are removed using the same procedure that is used for rectal cancer.  This involves removing some healthy colorectal tissue, as well as some of the nearby lymph nodes. Regional spread Whenever possible, the tumor and areas of spread to nearby tissues and lymph nodes are removed using surgery. If this is not possible, surgery can help relieve symptoms, such as intestinal blockage. Distant spread If cancer has spread to another location in the body, it is called metastatic cancer. Patients with this diagnosis are encouraged to talk with doctors who are experienced in treating this stage of cancer, because there can be different opinions about the best treatment plan. Learn more about getting a second opinion before starting treatment, so you are comfortable with the treatment plan chosen. This discussion may include clinical trials. Your health care team may recommend a treatment plan that includes a combination of the types of treatment described above. Surgery is used to relieve symptoms rather than eliminate the cancer at this stage. If distant metastases are not causing symptoms, surgery may not be needed. If the person develops carcinoid syndrome, surgery to remove as much cancer as possible is often recommended. Chemotherapy and radiation therapy may also be offered to help relieve symptoms. Participation in clinical trials is encouraged. Palliative care will also be important to help relieve symptoms and side effects. For most patients, a diagnosis of metastatic cancer is very stressful and, at times, difficult to bear. Patients and their families are encouraged to talk about the way they are feeling with doctors, nurses, social workers, or other members of the health care team. It may also be helpful to talk with other patients, including through a support group. Remission and the chance of recurrence  A remission is when cancer cannot be detected in the body and there are no symptoms. This may also be called having "no evidence of disease" or NED. A remission may be  temporary or permanent. This uncertainty causes many people to worry that the cancer will come back. While many remissions are permanent, it's important to talk with your doctor about the possibility of the cancer returning. Understanding your risk of recurrence and the treatment options may help you feel more prepared if the cancer does return. Learn more about coping with the fear of recurrence. If the cancer does return after the original treatment, it is called recurrent cancer. It may come back in the same place (called a local recurrence), nearby (regional recurrence), or in another place (distant recurrence). When this occurs, a cycle of testing will begin again to learn as much as possible about the recurrence. After testing is done, you and your doctor will talk about your treatment options. Often the treatment plan will include the treatments described above, such as surgery, chemotherapy, and radiation therapy, but they may be used in a different combination or given at a different pace. Your doctor may also suggest clinical trials that are studying new ways to treat this  type of recurrent tumor. Whichever treatment plan you choose, palliative care will be important for relieving symptoms and side effects. People with recurrent cancer often experience emotions such as disbelief or fear. Patients are encouraged to talk with their health care team about these feelings and ask about support services to help them cope. Learn more about dealing with cancer recurrence. If treatment fails Recovery from a carcinoid tumor is not always possible. If the cancer cannot be cured or controlled, the disease may be called advanced or terminal. This diagnosis is stressful, and advanced cancer is difficult to discuss for many people. However, it is important to have open and honest conversations with your doctor and health care team to express your feelings, preferences, and concerns. The health care team is there to  help, and many team members have special skills, experience, and knowledge to support patients and their families. Making sure a person is physically comfortable and free from pain is extremely important. Patients who have advanced cancer and who are expected to live less than six months may want to consider a type of palliative care called hospice care. Hospice care is designed to provide the best possible quality of life for people who are near the end of life. You and your family are encouraged to think about where you would be most comfortable: at home, in the hospital, or in a hospice environment. Nursing care and special equipment can make staying at home a workable alternative for many families. Learn more about advanced cancer care planning. After the death of a loved one, many people need support to help them cope with the loss. Learn more about grief and loss. The next section in this guide is About Clinical Trials, and it offers more information about research studies that are focused on finding better ways to care for people with a carcinoid tumor. Or, use the menu on the side of your screen to choose another section to continue reading this guide.

## 2013-10-29 NOTE — Progress Notes (Signed)
Subjective:     Patient ID: Leslie Mcmillan, male   DOB: 06-Nov-1970, 43 y.o.   MRN: [id number]  HPI   Note: This dictation was prepared with Dragon/digital dictation along with [facility] technology. Any transcriptional errors that result from this process are unintentional.       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  He definitely feels like he is getting better.  No more pain to his right testicle.  Back to work.  Does a lot of lifting for pulmonary locations/moving.  Tolerating better.  This toward the end of the day.  No more urgency to have a bowel movement after he eats.  Improving.  Appetite better.  No fevers or chills.  Moderate active.  He still smokes.  Wanting to quit.  Due to follow up with medical oncology next week.  Patient Active Problem List   Diagnosis Date Noted  . Condyloma acuminatum of penis 08/12/2013  . 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 Duodenitis without bleeding 05/09/2013  . Diverticulosis of colon without hemorrhage 05/09/2013  . Rectal bleeding 05/08/2013  . RLQ abdominal pain 05/08/2013    Past Medical History  Diagnosis Date  . Anxiety   . Diverticulitis   . Gastroduodenitis   . Hypertension     bp runs high sometimes, no bp meds  . Depression     . GERD (gastroesophageal reflux disease)     no meds for  . Blood in stool 5--31-2015    Past Surgical History  Procedure Laterality Date  . Colonoscopy N/A 05/09/2013    Procedure: COLONOSCOPY;  Surgeon: Irene Shipper, MD;  Location: [facility];  Service: Endoscopy;  Laterality: N/A;  . Esophagogastroduodenoscopy N/A 05/09/2013    Procedure: ESOPHAGOGASTRODUODENOSCOPY (EGD);  Surgeon: Irene Shipper, MD;  Location: [facility];  Service: Endoscopy;  Laterality: N/A;  possible egd depending on colon results  . Tonsillectomy  as child  . Laparoscopic appendectomy N/A 08/12/2013    Procedure: LAPAROSCOPIC ASSISTED APPENDECTOMY;  Surgeon: Adin Hector, MD;  Location: [facility];  Service: General;  Laterality: N/A;  . Bowel resection N/A 08/12/2013    Procedure: SMALL BOWEL ILEAL RESECTION;  Surgeon: Adin Hector, MD;  Location: [facility];  Service: General;  Laterality: N/A;    History   Social History  . Marital Status: Single    Spouse Name: N/A    Number of Children: 6  . 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 Smoking status: Current Every Day Smoker -- 0.50 packs/day for 10 years    Types: Cigarettes  . Smokeless tobacco: Never Used  . Alcohol Use: Yes     Comment: 2-3 beers some days  . Drug Use: Yes    Special: Marijuana  . Sexual Activity: Not on file  Other Topics Concern  . Not on file   Social History Narrative  . No narrative on file    Family History  Problem Relation Age of Onset  . Diabetes Paternal Grandmother   . Hypertension Paternal Grandmother   . Diabetes Paternal Uncle   . Diabetes Paternal Aunt   . Liver disease Paternal Uncle   . Hypertension Paternal Aunt   . Anuerysm Mother     brain    Current Outpatient Prescriptions  Medication Sig Dispense Refill  . naproxen (NAPROSYN) 500 MG tablet Take 1 tablet (500 mg total) by mouth 2 (two) times daily with a meal.  40 tablet   1  . oxyCODONE (OXY IR/ROXICODONE) 5 MG immediate release tablet Take 1-2 tablets (5-10 mg total) by mouth every 4 (four) hours as needed for moderate pain, severe pain or breakthrough pain.  40 tablet  0   No current facility-administered medications for this visit.     No Known Allergies  BP 122/80  Pulse 77  Temp(Src) 98.4 F (36.9 C)  Ht _0  (1.753 m)  Wt 224 lb (101.606 kg)  BMI 33.06 kg/m2  Dg Chest 2 View  08/04/2013   CLINICAL DATA:  43 year old male preoperative study for abdomen exploration, resection of mesenteric mass. Initial encounter.  EXAM: CHEST  2 VIEW  COMPARISON:  CT Abdomen and Pelvis 05/05/2013 and earlier.  FINDINGS: Normal cardiac size and mediastinal contours. Visualized tracheal air column is within normal limits. Lung volumes are within normal limits. No pneumothorax, pulmonary edema, pleural effusion, confluent pulmonary opacity, or pulmonary nodule/ mass. Visualized bowel gas pattern is non obstructed. No acute osseous abnormality identified.  IMPRESSION: No acute cardiopulmonary abnormality.   Electronically Signed   By: Lars Pinks M.D.   On: 08/04/2013 14:40     Review of Systems  Constitutional: Positive for appetite change. Negative for fever, chills and diaphoresis.  HENT: Negative for sore throat and trouble swallowing.   Eyes: Negative for photophobia and visual disturbance.  Respiratory: Negative for choking and shortness of breath.   Cardiovascular: Negative for chest pain and palpitations.  Gastrointestinal: Positive for blood in stool. Negative for nausea, vomiting, abdominal distention and rectal pain.  Genitourinary: Positive for testicular pain. Negative for dysuria, urgency and difficulty urinating.  Musculoskeletal: Negative for arthralgias, gait problem, myalgias and neck pain.  Skin: Negative for color change and rash.  Neurological: Negative for dizziness, speech difficulty, weakness and numbness.  Hematological: Negative for adenopathy.   Psychiatric/Behavioral: Negative for hallucinations, confusion and agitation.       Objective:   Physical Exam  Constitutional: He is oriented to person, place, and time. He appears well-developed and well-nourished. No distress.  HENT:  Head: Normocephalic.  Mouth/Throat: Oropharynx is clear and moist. No oropharyngeal exudate.  Eyes: Conjunctivae and EOM are normal. Pupils are equal, round, and reactive to light. No scleral icterus.  Neck: Normal range of motion. No tracheal deviation present.  Cardiovascular: Normal rate, normal heart sounds and intact distal pulses.   Pulmonary/Chest: Effort normal. No respiratory distress.  Abdominal: Soft. He exhibits no distension. There is no tenderness. There is no rigidity, no rebound, no guarding, no tenderness at McBurney's point and negative Murphy's sign. No hernia. Hernia confirmed negative in the ventral area, confirmed negative in the right inguinal area and confirmed negative in the left inguinal area.    Incisions clean with normal healing ridges.  No hernias  Musculoskeletal: Normal range of motion. He exhibits no tenderness.  Neurological: He  is alert and oriented to person, place, and time. No cranial nerve deficit. He exhibits normal muscle tone. Coordination normal.  Skin: Skin is warm and dry. No rash noted. He is not diaphoretic.  Psychiatric: He has a normal mood and affect. His behavior is normal.       Assessment:     Recovering status post excision of ileum containing carcinoid.  Positive lymph nodes.    Plan:     I think he is doing better.  Survivor pathway on GI carcinoid tumor node positive resection per medical oncology:   "--We reviewed his history, images including his MRI of abdomen and Meckel's scan, pathology report as noted above. Based on its location, nodal involvement but negative metastases, follow up with MRI of abdomen three months - 12 months s/p surgery is recommended per NCCN guidelines. In  addition, we will measure chromogranin A levels and 5-HIAA. If elevated, we will order scans sooner and/or if he experiences symptoms concerning for metastatic diseases, i.e., flushing or diarrhea. His mucosal mass in the ileum measured 1.5 cm. The tumor cells invaded through the muscularis propria into the subserosal tissue without penetration of the inked serosa. Ki-67 rate was less than 3%. 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 The mucosa tumor was 10 cm from the mesenteric resection margin. We expressed to him given its low grade, well diffentiated with low Ki67, it has less risk of metastases. However, carcinoids of the ileum and small bowel can recur years later and will require follow up for several years.   Generally, patients are followed up with tumor markers and imaging every 3-6 months for first two years; annually for the next 4 years; and then every 2 years for the next decade. He was provided a handout about carcinoid of the ileum from the [facility] website."    Abdominal pain less & should continue to resolve over time.  Increase activity as tolerated to regular activity.  Low impact exercise such as walking an hour a day at least ideal.  Do not push through pain.  I again recommended stressing anti-inflammatories & heat for pain.  He did not feel he needed any more narcotics  Diet as tolerated.  Low fat high fiber diet ideal.  Bowel regimen with 30 g fiber a day and fiber supplement as needed to avoid problems.  Return to clinic as needed.  If he is not feeling better or worsens, repeat blood work and CT scan to rule out problems.  Seems less likely.  STOP SMOKING! We talked to the patient about the dangers of smoking.  We stressed that tobacco use dramatically increases the risk of peri-operative complications such as infection, tissue necrosis leaving to problems with  incision/wound and organ healing, hernia, chronic pain, heart attack, stroke, DVT, pulmonary embolism, and death.  We noted there are programs in our community to help stop smoking.  Information was given again.  I recommend he consider getting his condyloma removed from his penis.  He had that done in the 90s.  Does not have the money right now.  He will think about it.  Phone # to [facility] given.   Instructions discussed.  Followup with primary care physician for other health issues as would normally be done.  Consider screening for malignancies (breast, prostate, colon, melanoma, etc) as appropriate.  Questions answered.  The patient expressed understanding and appreciation

## 2013-11-03 ENCOUNTER — Encounter (INDEPENDENT_AMBULATORY_CARE_PROVIDER_SITE_OTHER): Payer: 59 | Admitting: Surgery

## 2013-11-05 ENCOUNTER — Encounter (INDEPENDENT_AMBULATORY_CARE_PROVIDER_SITE_OTHER): Payer: 59 | Admitting: Surgery

## 2013-11-06 ENCOUNTER — Other Ambulatory Visit (HOSPITAL_BASED_OUTPATIENT_CLINIC_OR_DEPARTMENT_OTHER): Payer: 59

## 2013-11-06 ENCOUNTER — Ambulatory Visit (HOSPITAL_COMMUNITY)
Admission: RE | Admit: 2013-11-06 | Discharge: 2013-11-06 | Disposition: A | Discharge status: Home / Self Care | Payer: 59 | Source: Ambulatory Visit | Attending: Internal Medicine | Admitting: Internal Medicine

## 2013-11-06 ENCOUNTER — Other Ambulatory Visit: Payer: Self-pay | Admitting: Internal Medicine

## 2013-11-06 DIAGNOSIS — R109 Unspecified abdominal pain: Secondary | ICD-10-CM | POA: Diagnosis present

## 2013-11-06 DIAGNOSIS — C7A012 Malignant carcinoid tumor of the ileum: Secondary | ICD-10-CM

## 2013-11-06 DIAGNOSIS — Z9889 Other specified postprocedural states: Secondary | ICD-10-CM | POA: Diagnosis not present

## 2013-11-06 DIAGNOSIS — D3A012 Benign carcinoid tumor of the ileum: Secondary | ICD-10-CM

## 2013-11-06 LAB — CBC WITH DIFFERENTIAL/PLATELET
BASO%: 0.3 % (ref 0.0–2.0)
BASOS ABS: 0 10*3/uL (ref 0.0–0.1)
EOS%: 4.2 % (ref 0.0–7.0)
Eosinophils Absolute: 0.3 10*3/uL (ref 0.0–0.5)
HCT: 39.3 % (ref 38.4–49.9)
HEMOGLOBIN: 13.2 g/dL (ref 13.0–17.1)
LYMPH%: 41.8 % (ref 14.0–49.0)
MCH: 28.2 pg (ref 27.2–33.4)
MCHC: 33.6 g/dL (ref 32.0–36.0)
MCV: 84 fL (ref 79.3–98.0)
MONO#: 0.7 10*3/uL (ref 0.1–0.9)
MONO%: 10.2 % (ref 0.0–14.0)
NEUT%: 43.5 % (ref 39.0–75.0)
NEUTROS ABS: 3.1 10*3/uL (ref 1.5–6.5)
Platelets: 244 10*3/uL (ref 140–400)
RBC: 4.68 10*6/uL (ref 4.20–5.82)
RDW: 13.9 % (ref 11.0–14.6)
WBC: 7.2 10*3/uL (ref 4.0–10.3)
lymph#: 3 10*3/uL (ref 0.9–3.3)

## 2013-11-06 LAB — COMPREHENSIVE METABOLIC PANEL (CC13)
ALBUMIN: 3.6 g/dL (ref 3.5–5.0)
ALT: 22 U/L (ref 0–55)
AST: 19 U/L (ref 5–34)
Alkaline Phosphatase: 69 U/L (ref 40–150)
Anion Gap: 9 mEq/L (ref 3–11)
BUN: 14 mg/dL (ref 7.0–26.0)
CALCIUM: 9.3 mg/dL (ref 8.4–10.4)
CHLORIDE: 109 meq/L (ref 98–109)
CO2: 24 mEq/L (ref 22–29)
Creatinine: 1.3 mg/dL (ref 0.7–1.3)
Glucose: 90 mg/dl (ref 70–140)
POTASSIUM: 4 meq/L (ref 3.5–5.1)
Sodium: 142 mEq/L (ref 136–145)
Total Bilirubin: 0.44 mg/dL (ref 0.20–1.20)
Total Protein: 6.8 g/dL (ref 6.4–8.3)

## 2013-11-06 LAB — LACTATE DEHYDROGENASE (CC13): LDH: 165 U/L (ref 125–245)

## 2013-11-06 MED ORDER — GADOBENATE DIMEGLUMINE 529 MG/ML IV SOLN
20.0000 mL | Freq: Once | INTRAVENOUS | Status: AC | PRN
  Administered 2013-11-06: 18 mL via INTRAVENOUS

## 2013-11-12 ENCOUNTER — Other Ambulatory Visit: Payer: Self-pay

## 2013-11-12 ENCOUNTER — Ambulatory Visit: Payer: 59

## 2013-11-12 DIAGNOSIS — D3A012 Benign carcinoid tumor of the ileum: Secondary | ICD-10-CM

## 2013-11-12 LAB — CHROMOGRANIN A: Chromogranin A: 18 ng/mL — ABNORMAL HIGH (ref <=15)

## 2013-11-13 ENCOUNTER — Other Ambulatory Visit: Payer: 59

## 2013-11-13 ENCOUNTER — Telehealth: Payer: Self-pay | Admitting: Hematology

## 2013-11-13 ENCOUNTER — Encounter: Payer: Self-pay | Admitting: Hematology

## 2013-11-13 ENCOUNTER — Ambulatory Visit (HOSPITAL_BASED_OUTPATIENT_CLINIC_OR_DEPARTMENT_OTHER): Payer: 59 | Admitting: Hematology

## 2013-11-13 VITALS — BP 135/83 | HR 81 | Temp 98.2°F | Resp 18 | Ht 69.0 in | Wt 223.6 lb

## 2013-11-13 DIAGNOSIS — G4700 Insomnia, unspecified: Secondary | ICD-10-CM | POA: Insufficient documentation

## 2013-11-13 DIAGNOSIS — C7A012 Malignant carcinoid tumor of the ileum: Secondary | ICD-10-CM

## 2013-11-13 DIAGNOSIS — R5383 Other fatigue: Secondary | ICD-10-CM

## 2013-11-13 DIAGNOSIS — R5381 Other malaise: Secondary | ICD-10-CM

## 2013-11-13 DIAGNOSIS — G44229 Chronic tension-type headache, not intractable: Secondary | ICD-10-CM

## 2013-11-13 DIAGNOSIS — C7A098 Malignant carcinoid tumors of other sites: Secondary | ICD-10-CM

## 2013-11-13 DIAGNOSIS — G43009 Migraine without aura, not intractable, without status migrainosus: Secondary | ICD-10-CM | POA: Insufficient documentation

## 2013-11-13 NOTE — Telephone Encounter (Signed)
gv adn printed appt sched and avs for pt for Dec..... [facility] does not see pts with that diagnoses...pt sched at [facility] on 9.14 @ 10am....gv and emailed Blima Singer to fax records to [facility]....Marland KitchenMarland KitchenDr. Merlene Laughter office will not sched appt unless pt is reffered by pcp and he will see pcp 9.17 and get the referral

## 2013-11-13 NOTE — Progress Notes (Signed)
[place] [phone]   [phone]  ONCOLOGY FOLLOW UP NOTE   Name: Leslie Mcmillan Date: 11/13/2013 MRN: [id number] DOB: 1970/12/23  PCP: No PCP Per Patient   REFERRING PHYSICIAN: Michael Boston, MD  PATIENT IDENTIFICATION: [name]  HISTORY OF PRESENT ILLNESS:Leslie Mcmillan is a 43 y.o. 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 Johney Maine.     He reports that in winter of 2012, he abdominal pains described as tense muscle spasms, sharp pains that were severe in quality.  He went to the emergency room at [facility] and he reports that he was told that he inflammation and provided antibiotics and ibuprofen. CT of abdomen on 10/25/2010 showed that there was mild inflammation surrounding the proximal jejunum near the ligament of Treitz, findings which could represent focal enteritis. He had later complained of some severe headaches causing an emergency room visit [facility] ED and he had a CT of head in January 2015 which showed no acute intracranial abnormality.   He notes that there was slow progression of his abdominal pain over time.  Eventually, due to it recurrence and progression, he presented to [facility] ER and CT of abdomen and pelvis was unrevealing on 05/05/2013.  Due to progressive abdominal pain and rectal bleeding, he was referred to [facility] (Dr. Hilarie Fredrickson).  He had a Meckels scan on 05/22/2013  and MRI (05/30/2013) and capsule endoscopy.  Meckel's scan was without scintigraphic evidence of ectopic gastric mucosa and demonstrated that there was a subtle round soft tissue mass within the right lower quadrant which was difficult to distinguish from the adjacent loops of small bowel. This small mass measured 2.1 x 2.4 cm on 10/25/2010 increased slowly to 2.8 x 2.7 cm on CT of 05/05/2013. Common differential for this small slow growing lesion would be a  carcinoid tumor or a gastrointestinal stromal tumor. No retractile pattern typical of carcinoid.    MRI of abdomen and pelvis on 05/30/2013 showed a diffusely enhacing 3.0 x 2.0 x 2.2 cm mass in the  new mesentery with close associated mesenteric vessels but the lesion did not appear to invade the small bowel.  As noted on the Mecke's can, top differential diagnostic consideration would be GIST or carcinoid tumor.  Lymphoma was not completely excluded. Of note, he also had several jejunal loops that were somewhat featureless which could be due to fold thickening or effacement.   Based on results of these studies, he was referred to to Surgery (Dr. Johney Maine).  He was admitted for mesenteric mass and present carcinoid and underwent laparoscopic assisted resection of ileum and ymph nodes on 08/12/2013.  There were no complications.  His pathology noted carcinoid with 3/6 lymph nodes positive.  Medical oncology consultation was obtained and patient seen by Dr Juliann Mule on 09/04/2013.     He comes for 3 month follow up after getting labs and 24 hour urine testing done. He also had a MRI abdomen done.   INTERVAL HISTORY:  Patient denies any GI symptoms, no nausea, vomiting, abdominal pain, diarrhea. No flushing. He continues to have chronic headaches. He does not feel rested in am and he has been found to snore. Day time sleepiness present. He tried some medicine from [employer] store to help sleep but did not work. He has not had an eye exam but c/o blurring of vision at times. Rest of the ROS  IS NEGATIVE.    PAST MEDICAL HISTORY:  has a past medical history of Anxiety; Diverticulitis; Gastroduodenitis; Hypertension; Depression; GERD (gastroesophageal reflux disease); and Blood in stool (5--31-2015).     PAST SURGICAL HISTORY: Past Surgical History  Procedure Laterality Date  . Colonoscopy N/A 05/09/2013    Procedure: COLONOSCOPY;  Surgeon: Irene Shipper, MD;  Location: [facility];  Service: Endoscopy;   Laterality: N/A;  . Esophagogastroduodenoscopy N/A 05/09/2013    Procedure: ESOPHAGOGASTRODUODENOSCOPY (EGD);  Surgeon: Irene Shipper, MD;  Location: [facility];  Service: Endoscopy;  Laterality: N/A;  possible egd depending on colon results  . Tonsillectomy  as child  . Laparoscopic appendectomy N/A 08/12/2013    Procedure: LAPAROSCOPIC ASSISTED APPENDECTOMY;  Surgeon: Adin Hector, MD;  Location: [facility];  Service: General;  Laterality: N/A;  . Bowel resection N/A 08/12/2013    Procedure: SMALL BOWEL ILEAL RESECTION;  Surgeon: Adin Hector, MD;  Location: [facility];  Service: General;  Laterality: N/A;     CURRENT MEDICATIONS: has a current medication list which includes the following prescription(s): naproxen and oxycodone.   ALLERGIES: Review of patient's allergies indicates no known allergies.   SOCIAL HISTORY:  reports that he has been smoking Cigarettes.  He has a 5 pack-year smoking history. He has never used smokeless tobacco. He reports that he drinks alcohol. He reports that he uses illicit drugs (Marijuana).   FAMILY HISTORY: family history includes Anuerysm in his mother; Diabetes in his paternal aunt, paternal grandmother, and paternal uncle; Hypertension in his paternal aunt and paternal grandmother; Liver disease in his paternal uncle.   LABORATORY DATA:               RADIOGRAPHY:  MR ABDOMEN W WO CONTRAST 11/06/2013  CLINICAL DATA: Carcinoid tumor of ileum. Surgery 6/15. Intermittent abdominal pain. EXAM: MRI ABDOMEN WITHOUT AND WITH CONTRAST  TECHNIQUE: Multiplanar multisequence MR imaging of the abdomen was performed both before and after the administration of intravenous contrast.  CONTRAST: 54m MULTIHANCE GADOBENATE DIMEGLUMINE 529 MG/ML IV SOLN 18 cc MultiHance  COMPARISON: 05/30/2013 and CT of 05/05/2013.  FINDINGS: Normal heart size without pericardial or pleural effusion. A tiny cyst in the right lobe of the liver on image 28 of series 8. No suspicious  liver lesions and no abnormal foci of arterial hyper enhancement. Normal spleen, stomach, pancreas, gallbladder, biliary tract, adrenal glands, kidneys. No abdominal adenopathy or ascites. Normal caliber of abdominal bowel loops. Small jejunal mesenteric nodes are likely reactive. No evidence omental/peritoneal disease. IMPRESSION: No acute process or evidence of metastatic disease within the abdomen. Electronically Signed  By: KAbigail MiyamotoM.D. On: 11/06/2013 11:31       PATHOLOGY:  Diagnosis 1. Appendix, Other than Incidental - BENIGN APPENDICEAL TISSUE WITH REACTIVE LYMPHOID HYPERPLASIA AND FIBROUS OBLITERANS. - NO EVIDENCE OF CARCINOID TUMOR. - NO EVIDENCE OF ACTIVE INFLAMMATION OR NEOPLASIA. 2. Small intestine, resection for tumor, ileal with ileal mass, mesenteric mass - WELL DIFFERENTIATED NEUROENDOCRINE TUMOR (CARCINOID TUMOR), INVADING THROUGH THE MUSCULARIS PROPRIA INTO SUBSEROSAL TISSUE. - ANGIOLYMPHATIC INVASION PRESENT. - THREE OF SIX LYMPH NODES, POSITIVE FOR CARCINOID TUMOR (3/6). - RESECTION MARGINS, NEGATIVE FOR ATYPIA OR MALIGNANCY. Microscopic Comment 2. 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 Histologic Grade : G1, Low grade Mitotic Rate < 2/10 high-power fields (HIGH POWER FIELD), Ki-67: <3% Microscopic Tumor Extension: Invading through  the muscularis propria into subserosal tissue. Margins: Negative Proximal Margin: 8 cm Distal Margin: 19.5 cm Mesenteric (Radial) Margin : 10 cm from the mucosa lesion and 0.1 cm from the positive mesenteric lymph node, please see comment for details. Distance to the closest margin: please see comment for details.Lymph-Vascular Invasion: Present Perineural Invasion: Not identified Lymph nodes: number  examined 6; number positive: 3. TNM Staging: pT3, pN1 1 of 3 FINAL for Leslie Mcmillan, Leslie Mcmillan ([id number]) Microscopic Comment(continued) Additional findings: N/A Comments: The mucosal mass in the ileum measures 1.5 cm. The tumor cells invade through the muscularis propria into the subserosal tissue without penetration of the inked serosa.Immunohistochemical stains were performed and Ki-67 rate is less than 3%. 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 The mucosa tumor is 10 cm from the mesenteric resection margin. The tumor in the partially replaced lymph node is 0.1 cm from the inked mesenteric margin. The case was discussed with Dr. Neysa Bonito on 08/13/2013. (HCL:kh 08/13/13) H. CATHERINE LI MDPathologist, Electronic Signature(Case signed 08/14/2013)   REVIEW OF SYSTEMS:  Constitutional: Denies fevers, chills or abnormal weight loss Eyes: Denies blurriness of vision Ears, nose, mouth, throat, and face: Denies mucositis or sore throat Respiratory: Denies cough, dyspnea or wheezes Cardiovascular: Denies palpitation, chest discomfort or lower extremity swelling Gastrointestinal:  Denies nausea, heartburn; diarrhea as noted in HPI.  Skin: Denies abnormal skin rashes Lymphatics: Denies new lymphadenopathy or easy bruising Neurological:Denies numbness, tingling or new weaknesses Behavioral/Psych: Mood is stable, no new changes  All other systems were reviewed with the patient and are negative.  PHYSICAL EXAM:  height is '5\' 9"'  (1.753 m) and weight is 223 lb 9.6 oz (101.424 kg). His oral temperature is 98.2 F (36.8 C). His blood pressure is 135/83 and his pulse is 81. His respiration is 18.    GENERAL:alert, no distress and comfortable; anxious, well developed and well nourished.  SKIN: skin color, texture, turgor are normal, no rashes or significant lesions EYES: normal, Conjunctiva are pink and non-injected,  sclera clear OROPHARYNX:no exudate, no erythema and lips, buccal mucosa, and tongue normal  NECK: supple, thyroid normal size, non-tender, without nodularity LYMPH:  no palpable lymphadenopathy in the cervical, axillary or inguinal LUNGS: clear to auscultation and percussion with normal breathing effort HEART: regular rate & rhythm and no murmurs and no lower extremity edema ABDOMEN:abdomen soft, non-tender and normal bowel sounds; abdominal mid-line incision well healed; TTP in the epigastric area.  Musculoskeletal:no cyanosis of digits and no clubbing  NEURO: alert & oriented x 3 with fluent speech, no focal motor/sensory deficits   IMPRESSION: Leslie Mcmillan is a 43 y.o. male with a history of LOW GRADE CARCINOID s/p resection on 08/12/2013.    PLAN:  1.  Carcinoid of the Ileum s/p resection, Stage III (pT3,pN1) --Patient is doing well. Chromogranin level mildly elevated and 24 hour urine 5-HIAA level pending. CBC/CMET are normal. MRI for restaging is completely normal with NED (No evidence of disease) -- I will see him again 3 months and repeat his labs and markers for carcinoid.    2. Chronic Headaches, Insomnia, fatigue -- We will refer him to Neurology for headaches, Opthalmology for an eye exam and arrange a sleep study for him.He can try some OTC melatonin hormone in the mean time. If sleep apnea is the underlying cause of his symptom complex, CPAP can help him tremendously.  All questions were answered. The patient knows to call the clinic with  any problems, questions or concerns. We can certainly see the patient much sooner if necessary.  I spent 40 minutes counseling the patient face to face. The total time spent in the appointment was 50 minutes.    Bernadene Bell, MD Medical Hematologist/Oncologist [place] Pager: [phone] Office No: [phone]

## 2013-11-14 ENCOUNTER — Telehealth: Payer: Self-pay

## 2013-11-14 NOTE — Telephone Encounter (Signed)
Faxed pt medical records to  [facility] °

## 2013-11-15 LAB — 5 HIAA, QUANTITATIVE, URINE, 24 HOUR
5-HIAA, URINE: 2.6 mg/(24.h) (ref <=6.0)
VOLUME, URINE-5HIAA: 700 mL/(24.h)

## 2013-11-18 ENCOUNTER — Encounter: Payer: Self-pay | Admitting: Internal Medicine

## 2013-11-20 ENCOUNTER — Encounter: Payer: Self-pay | Admitting: Family Medicine

## 2013-11-20 ENCOUNTER — Ambulatory Visit (INDEPENDENT_AMBULATORY_CARE_PROVIDER_SITE_OTHER): Payer: 59 | Admitting: Family Medicine

## 2013-11-20 VITALS — BP 150/88 | HR 96 | Temp 98.2°F | Resp 16 | Ht 69.0 in | Wt 224.5 lb

## 2013-11-20 DIAGNOSIS — J309 Allergic rhinitis, unspecified: Secondary | ICD-10-CM | POA: Insufficient documentation

## 2013-11-20 DIAGNOSIS — J301 Allergic rhinitis due to pollen: Secondary | ICD-10-CM

## 2013-11-20 DIAGNOSIS — G43009 Migraine without aura, not intractable, without status migrainosus: Secondary | ICD-10-CM

## 2013-11-20 DIAGNOSIS — I10 Essential (primary) hypertension: Secondary | ICD-10-CM

## 2013-11-20 LAB — CBC WITH DIFFERENTIAL/PLATELET
BASOS PCT: 0.6 % (ref 0.0–3.0)
Basophils Absolute: 0 10*3/uL (ref 0.0–0.1)
EOS ABS: 0.2 10*3/uL (ref 0.0–0.7)
Eosinophils Relative: 2.8 % (ref 0.0–5.0)
HCT: 40.9 % (ref 39.0–52.0)
Hemoglobin: 13.6 g/dL (ref 13.0–17.0)
LYMPHS PCT: 31.6 % (ref 12.0–46.0)
Lymphs Abs: 2.1 10*3/uL (ref 0.7–4.0)
MCHC: 33.3 g/dL (ref 30.0–36.0)
MCV: 84.9 fl (ref 78.0–100.0)
MONOS PCT: 5.7 % (ref 3.0–12.0)
Monocytes Absolute: 0.4 10*3/uL (ref 0.1–1.0)
NEUTROS PCT: 59.3 % (ref 43.0–77.0)
Neutro Abs: 4 10*3/uL (ref 1.4–7.7)
Platelets: 234 10*3/uL (ref 150.0–400.0)
RBC: 4.82 Mil/uL (ref 4.22–5.81)
RDW: 14.2 % (ref 11.5–15.5)
WBC: 6.7 10*3/uL (ref 4.0–10.5)

## 2013-11-20 LAB — BASIC METABOLIC PANEL
BUN: 14 mg/dL (ref 6–23)
CALCIUM: 9.1 mg/dL (ref 8.4–10.5)
CO2: 28 meq/L (ref 19–32)
CREATININE: 1.6 mg/dL — AB (ref 0.4–1.5)
Chloride: 104 mEq/L (ref 96–112)
GFR: 62.93 mL/min (ref >60.00)
Glucose, Bld: 180 mg/dL — ABNORMAL HIGH (ref 70–99)
Potassium: 3.5 mEq/L (ref 3.5–5.1)
Sodium: 139 mEq/L (ref 135–145)

## 2013-11-20 MED ORDER — FLUTICASONE PROPIONATE 50 MCG/ACT NA SUSP: 2.0000 | Freq: Every day | NASAL | Status: DC

## 2013-11-20 MED ORDER — SUMATRIPTAN SUCCINATE 50 MG PO TABS: 50.0000 mg | ORAL_TABLET | Freq: Once | ORAL | Status: DC

## 2013-11-20 MED ORDER — LOSARTAN POTASSIUM 50 MG PO TABS: 50.0000 mg | ORAL_TABLET | Freq: Every day | ORAL | Status: DC

## 2013-11-20 NOTE — Progress Notes (Signed)
   Subjective:    Patient ID: Leslie Mcmillan, male    DOB: 1971-01-23, 43 y.o.   MRN: [id number]  HPI New pt.  Previous MD- none.  Carcinoid- s/p surgery w/ Dr Johney Maine.  Currently following in Oncology clinic w/ Dr Lona Kettle.  HTN- pt reports hx of 'fluctuating' BP.  + family hx of HTN.  No CP, intermittent SOB, chronic HAs.  No edema  Chronic daily HAs- pt has been seen twice in ER and was given migraine cocktail w/ temporary relief.  HAs are typically L sided, worse w/ movement, + photo and phonophobia w/ some blurry vision.  Pt reports he 'can feel it coming'.  Oncology recommended neuro appt and eye exam but pt has not had this.  + nausea.   Review of Systems For ROS see HPI     Objective:   Physical Exam  Vitals reviewed. Constitutional: He is oriented to person, place, and time. He appears well-developed and well-nourished. No distress.  HENT:  Head: Normocephalic and atraumatic.  No TTP over sinuses + turbinate edema + PND TMs normal bilaterally  Eyes: Conjunctivae and EOM are normal. Pupils are equal, round, and reactive to light.  Neck: Normal range of motion. Neck supple. No thyromegaly present.  Cardiovascular: Normal rate, regular rhythm, normal heart sounds and intact distal pulses.   No murmur heard. Pulmonary/Chest: Effort normal and breath sounds normal. No respiratory distress. He has no wheezes.  Abdominal: Soft. Bowel sounds are normal. He exhibits no distension.  Musculoskeletal: He exhibits no edema.  Lymphadenopathy:    He has no cervical adenopathy.  Neurological: He is alert and oriented to person, place, and time. He has normal reflexes. No cranial nerve deficit.  Skin: Skin is warm and dry.  Psychiatric: He has a normal mood and affect. His behavior is normal.          Assessment & Plan:

## 2013-11-20 NOTE — Patient Instructions (Signed)
Follow up in 1 month to recheck BP We'll notify you of your lab results and make any changes if needed START the Losartan daily for BP Start OTC Claritin or Zyrtec for seasonal allergies Use the nasal spray- 2 sprays each nostril daily to decrease congestion and headaches Drink plenty of fluids At the first sign of headache, take 2 excedrin migraine.  If the headache continues after 30 minutes, take the Imitrex for the migraine We'll call you with your Neuro and eye appts Call with any questions or concerns Welcome!  We're glad to have you! HANG IN THERE!!

## 2013-11-20 NOTE — Progress Notes (Signed)
Pre visit review using our clinic review tool, if applicable. No additional management support is needed unless otherwise documented below in the visit note. 

## 2013-11-21 ENCOUNTER — Encounter: Payer: Self-pay | Admitting: General Practice

## 2013-11-21 ENCOUNTER — Ambulatory Visit: Payer: 59

## 2013-11-21 DIAGNOSIS — R7309 Other abnormal glucose: Secondary | ICD-10-CM

## 2013-11-21 LAB — HEMOGLOBIN A1C: HEMOGLOBIN A1C: 5.6 % (ref 4.6–6.5)

## 2013-11-23 NOTE — Assessment & Plan Note (Signed)
New to provider, ongoing for pt.  Oncology wanted pt to see neuro and have eye exam.  Will refer.  Pt to use imitrex as needed.  Will follow.

## 2013-11-23 NOTE — Assessment & Plan Note (Signed)
New.  Pt w/ allergic crease across bridge of nose and markedly edematous turbinates w/ copious PND.  Start daily antihistamine and nasal steroid spray.  Reviewed supportive care and red flags that should prompt return.  Pt expressed understanding and is in agreement w/ plan.

## 2013-11-23 NOTE — Assessment & Plan Note (Signed)
New.  Pt reports BP has been high previously.  May be contributing to his HA.  Start ARB.  Check labs as baseline.  Repeat in 1 month when pt returns for BP f/u.  Reviewed supportive care and red flags that should prompt return.  Pt expressed understanding and is in agreement w/ plan.

## 2013-12-22 ENCOUNTER — Encounter: Payer: Self-pay | Admitting: General Practice

## 2013-12-22 ENCOUNTER — Ambulatory Visit (INDEPENDENT_AMBULATORY_CARE_PROVIDER_SITE_OTHER): Payer: 59 | Admitting: Family Medicine

## 2013-12-22 ENCOUNTER — Encounter: Payer: Self-pay | Admitting: Family Medicine

## 2013-12-22 VITALS — BP 140/92 | HR 85 | Temp 98.4°F | Resp 16 | Wt 230.1 lb

## 2013-12-22 DIAGNOSIS — K047 Periapical abscess without sinus: Secondary | ICD-10-CM

## 2013-12-22 DIAGNOSIS — G43009 Migraine without aura, not intractable, without status migrainosus: Secondary | ICD-10-CM

## 2013-12-22 DIAGNOSIS — I10 Essential (primary) hypertension: Secondary | ICD-10-CM

## 2013-12-22 LAB — BASIC METABOLIC PANEL
BUN: 9 mg/dL (ref 6–23)
CHLORIDE: 105 meq/L (ref 96–112)
CO2: 28 meq/L (ref 19–32)
Calcium: 9 mg/dL (ref 8.4–10.5)
Creatinine, Ser: 1.4 mg/dL (ref 0.4–1.5)
GFR: 71.86 mL/min (ref >60.00)
Glucose, Bld: 180 mg/dL — ABNORMAL HIGH (ref 70–99)
Potassium: 3.9 mEq/L (ref 3.5–5.1)
SODIUM: 140 meq/L (ref 135–145)

## 2013-12-22 MED ORDER — DOXYCYCLINE HYCLATE 100 MG PO TABS: 100.0000 mg | ORAL_TABLET | Freq: Two times a day (BID) | ORAL | Status: DC

## 2013-12-22 NOTE — Assessment & Plan Note (Signed)
Pt was referred to Neuro previously but was not able to answer his phone, 'it was off'.  Will re-refer and encouraged him to answer his phone.  Pt agreeable

## 2013-12-22 NOTE — Assessment & Plan Note (Signed)
Pt's BP is mildly improved but difficult to completely assess due to pt's level of pain.  Repeat BMP due to starting Losartan.  No med changes at this time.  Will follow closely.

## 2013-12-22 NOTE — Assessment & Plan Note (Signed)
New.  Start abx.  Dental referral provided

## 2013-12-22 NOTE — Progress Notes (Signed)
   Subjective:    Patient ID: Leslie Mcmillan, male    DOB: 04-Nov-1970, 43 y.o.   MRN: [id number]  HPI HTN- chronic problem, started on Losartan at last visit.  SBP has improved, DBP remains elevated but pt is in pain today.  No CP, SOB, visual changes, edema  R lower tooth pain- sxs started ~1 yr ago.  Pain has been severe for 2 days.  Low grade fever yesterday.  +visible abscess, no drainage.  Pt does not have a dentist.  HA- neuro was unable to reach pt for referral b/c 'phone was off'.  Will re-enter.  Review of Systems For ROS see HPI     Objective:   Physical Exam  Vitals reviewed. Constitutional: He is oriented to person, place, and time. He appears well-developed and well-nourished. No distress.  HENT:  Head: Normocephalic and atraumatic.  R lower facial swelling w/ TTP over R lower jaw  Eyes: Conjunctivae and EOM are normal. Pupils are equal, round, and reactive to light.  Neck: Normal range of motion. Neck supple. No thyromegaly present.  Cardiovascular: Normal rate, regular rhythm, normal heart sounds and intact distal pulses.   No murmur heard. Pulmonary/Chest: Effort normal and breath sounds normal. No respiratory distress.  Abdominal: Soft. Bowel sounds are normal. He exhibits no distension.  Musculoskeletal: He exhibits no edema.  Lymphadenopathy:    He has no cervical adenopathy.  Neurological: He is alert and oriented to person, place, and time. No cranial nerve deficit.  Skin: Skin is warm and dry.  Psychiatric: He has a normal mood and affect. His behavior is normal.          Assessment & Plan:

## 2013-12-22 NOTE — Patient Instructions (Signed)
Follow up in 3 months to recheck BP START the Doxy as directed- take w/ food- for the tooth abscess Please answer when neuro calls you for your appt They should call you with the names and #s of available dentists We'll notify you of your lab results Call with any questions or concerns Hang in there!

## 2013-12-22 NOTE — Progress Notes (Signed)
Pre visit review using our clinic review tool, if applicable. No additional management support is needed unless otherwise documented below in the visit note. 

## 2013-12-23 ENCOUNTER — Telehealth: Payer: Self-pay | Admitting: Family Medicine

## 2013-12-23 NOTE — Telephone Encounter (Signed)
emmi emailed °

## 2014-01-21 ENCOUNTER — Emergency Department (HOSPITAL_COMMUNITY)
Admission: EM | Admit: 2014-01-21 | Discharge: 2014-01-22 | Disposition: A | Discharge status: Home / Self Care | Payer: 59 | Attending: Emergency Medicine | Admitting: Emergency Medicine

## 2014-01-21 DIAGNOSIS — Z7951 Long term (current) use of inhaled steroids: Secondary | ICD-10-CM | POA: Diagnosis not present

## 2014-01-21 DIAGNOSIS — K088 Other specified disorders of teeth and supporting structures: Secondary | ICD-10-CM | POA: Diagnosis present

## 2014-01-21 DIAGNOSIS — I10 Essential (primary) hypertension: Secondary | ICD-10-CM | POA: Diagnosis not present

## 2014-01-21 DIAGNOSIS — Z720 Tobacco use: Secondary | ICD-10-CM | POA: Diagnosis not present

## 2014-01-21 DIAGNOSIS — Z791 Long term (current) use of non-steroidal anti-inflammatories (NSAID): Secondary | ICD-10-CM | POA: Insufficient documentation

## 2014-01-21 DIAGNOSIS — D3A012 Benign carcinoid tumor of the ileum: Secondary | ICD-10-CM

## 2014-01-21 DIAGNOSIS — K029 Dental caries, unspecified: Secondary | ICD-10-CM | POA: Diagnosis not present

## 2014-01-21 DIAGNOSIS — Z792 Long term (current) use of antibiotics: Secondary | ICD-10-CM | POA: Diagnosis not present

## 2014-01-21 DIAGNOSIS — Z8659 Personal history of other mental and behavioral disorders: Secondary | ICD-10-CM | POA: Diagnosis not present

## 2014-01-21 DIAGNOSIS — K0889 Other specified disorders of teeth and supporting structures: Secondary | ICD-10-CM

## 2014-01-22 ENCOUNTER — Other Ambulatory Visit (INDEPENDENT_AMBULATORY_CARE_PROVIDER_SITE_OTHER): Payer: Self-pay | Admitting: Surgery

## 2014-01-22 ENCOUNTER — Encounter (HOSPITAL_COMMUNITY): Payer: Self-pay | Admitting: Nurse Practitioner

## 2014-01-22 MED ORDER — HYDROCODONE-ACETAMINOPHEN 5-325 MG PO TABS: 1.0000 | ORAL_TABLET | Freq: Four times a day (QID) | ORAL | Status: DC | PRN

## 2014-01-22 MED ORDER — BUPIVACAINE-EPINEPHRINE (PF) 0.5% -1:200000 IJ SOLN
1.8000 mL | Freq: Once | INTRAMUSCULAR | Status: AC
  Administered 2014-01-22: 1.8 mL
  Filled 2014-01-22: qty 1.8

## 2014-01-22 MED ORDER — PENICILLIN V POTASSIUM 500 MG PO TABS: 500.0000 mg | ORAL_TABLET | Freq: Four times a day (QID) | ORAL | Status: AC

## 2014-01-22 MED ORDER — PENICILLIN V POTASSIUM 500 MG PO TABS
500.0000 mg | ORAL_TABLET | Freq: Once | ORAL | Status: AC
  Administered 2014-01-22: 500 mg via ORAL
  Filled 2014-01-22: qty 1

## 2014-01-22 MED ORDER — HYDROCODONE-ACETAMINOPHEN 5-325 MG PO TABS
2.0000 | ORAL_TABLET | Freq: Once | ORAL | Status: AC
  Administered 2014-01-22: 2 via ORAL
  Filled 2014-01-22: qty 2

## 2014-01-22 MED ORDER — PENICILLIN V POTASSIUM 500 MG PO TABS: 500.0000 mg | ORAL_TABLET | Freq: Once | ORAL | Status: DC

## 2014-01-22 NOTE — Discharge Instructions (Signed)
Dental Pain °A tooth ache may be caused by cavities (tooth decay). Cavities expose the nerve of the tooth to air and hot or cold temperatures. It may come from an infection or abscess (also called a boil or furuncle) around your tooth. It is also often caused by dental caries (tooth decay). This causes the pain you are having. °DIAGNOSIS  °Your caregiver can diagnose this problem by exam. °TREATMENT  °· If caused by an infection, it may be treated with medications which kill germs (antibiotics) and pain medications as prescribed by your caregiver. Take medications as directed. °· Only take over-the-counter or prescription medicines for pain, discomfort, or fever as directed by your caregiver. °· Whether the tooth ache today is caused by infection or dental disease, you should see your dentist as soon as possible for further care. °SEEK MEDICAL CARE IF: °The exam and treatment you received today has been provided on an emergency basis only. This is not a substitute for complete medical or dental care. If your problem worsens or new problems (symptoms) appear, and you are unable to meet with your dentist, call or return to this location. °SEEK IMMEDIATE MEDICAL CARE IF:  °· You have a fever. °· You develop redness and swelling of your face, jaw, or neck. °· You are unable to open your mouth. °· You have severe pain uncontrolled by pain medicine. °MAKE SURE YOU:  °· Understand these instructions. °· Will watch your condition. °· Will get help right away if you are not doing well or get worse. °Document Released: 02/20/2005 Document Revised: 05/15/2011 Document Reviewed: 10/09/2007 °ExitCare® Patient Information ©2015 [employer]. This information is not intended to replace advice given to you by your health care provider. Make sure you discuss any questions you have with your health care provider. ° °Emergency Department Resource Guide °1) Find a Doctor and Pay Out of Pocket °Although you won't have to find out who  is covered by your insurance plan, it is a good idea to ask around and get recommendations. You will then need to call the office and see if the doctor you have chosen will accept you as a new patient and what types of options they offer for patients who are self-pay. Some doctors offer discounts or will set up payment plans for their patients who do not have insurance, but you will need to ask so you aren't surprised when you get to your appointment. ° °2) Contact Your Local Health Department °Not all health departments have doctors that can see patients for sick visits, but many do, so it is worth a call to see if yours does. If you don't know where your local health department is, you can check in your phone book. The CDC also has a tool to help you locate your state's health department, and many state websites also have listings of all of their local health departments. ° °3) Find a Walk-in Clinic °If your illness is not likely to be very severe or complicated, you may want to try a walk in clinic. These are popping up all over the country in pharmacies, drugstores, and shopping centers. They're usually staffed by nurse practitioners or physician assistants that have been trained to treat common illnesses and complaints. They're usually fairly quick and inexpensive. However, if you have serious medical issues or chronic medical problems, these are probably not your best option. ° °No Primary Care Doctor: °- Call Health Connect at  832-8000 - they can help you locate a primary   care doctor that  accepts your insurance, provides certain services, etc. °- Physician Referral Service- 1-800-533-3463 ° °Chronic Pain Problems: °Organization         Address  Phone   Notes  °[facility]  (336) 297-2271 Patients need to be referred by their primary care doctor.  ° °Medication Assistance: °Organization         Address  Phone   Notes  °[place] [facility] [address]  [address] °[place], [place] [address] (336) 641-8030 --Must be a resident of [place] °-- Must have NO insurance coverage whatsoever (no Medicaid/ Medicare, etc.) °-- The pt. MUST have a primary care doctor that directs their care regularly and follows them in the community °  °[facility]  (866) 331-1348   °[employer]  (888) 892-1162   ° °Agencies that provide inexpensive medical care: °Organization         Address  Phone   Notes  °[facility]  (336) 832-8035   °[facility]    (336) 832-7272   °[facility] [address] °[place], [place] [address] (336) 832-4777   °[facility] [address], °[place] (336) 271-4999   °[facility]    (336) 373-0678   °[facility]    (336) 272-1050   °[facility] ° [address], [place] Phone:  (336) 832-4444, Fax:  (336) 832-4440 Hours of Operation:  9 am - 6 pm, M-F.  Also accepts Medicaid/Medicare and self-pay.  °[facility] ° [address], [place] Phone: (336) 832-3150, Fax: (336) 832-3151. Hours of Operation:  8:30 am - 5:30 pm, M-F.  Also accepts Medicaid and self-pay.  °[facility] [place] [address], [place] Phone: (336) 878-6027   °[facility] [address], [place], [place] (336)723-1848, Ext. 123 Mondays & Thursdays: 7-9 AM.  First 15 patients are seen on a first come, first serve basis. °  ° °Medicaid-accepting [place] Providers: ° °Organization         Address  Phone   Notes  °[facility] [address], [place] (336) 641-2100 Also accepts self-pay patients.  °[facility] [address], [place] ° (336) 856-9996   °[facility] [address], [place] (336) 288-8857   °[facility] [address] [place] [address], [place] (336) 299-7000   °Veita Bland [address], [place]  ° (336) 373-1557 Only accepts [place] Access Medicaid patients after they have their name applied to their card.  ° °Self-Pay (no insurance) in [place]: ° °Organization         Address  Phone   Notes  °Sickle Cell Patients, [facility] [address], [place] (336) 832-1970   °[facility] [address], [place] (336) 832-4400   °[facility] ° [address], [place] (336) 992-4800   °[facility]/Dr. Osei-Bonsu ° [address] [place] [address], [place] or [address], [place] (336) 841-8500 Phone number for both [place] and [place] locations is the same.  °[facility] [address], [place] (336) 299-0000   °[facility] [address] [place] [address],  or [address] (336) 852-7530 °(336) 878-2260   °  [facility] [address], [place] (336) 350-1642, phone; (336) 294-5005, fax Sees patients 1st and 3rd Saturday of every month.  Must not qualify for public or private insurance (i.e. Medicaid, Medicare, [place] Health Choice, Veterans' Benefits) • Household income should be no more than 200% of the poverty level •The clinic cannot treat you if you are pregnant or think you are pregnant • Sexually transmitted diseases are not treated at the clinic.  ° ° °Dental Care: °Organization         Address  Phone  Notes  °[facility] [address], [place] (336) 641-6152 Accepts children up to age 21 who are enrolled in Medicaid or [place] Health Choice; pregnant women with a Medicaid card; and children who have applied for Medicaid or [place] Health Choice, but were declined, whose parents can pay a reduced fee at time of service.  °[facility] [place]  [address], [place] (336) 641-7733 Accepts children up to age 21 who are  enrolled in Medicaid or [place] Health Choice; pregnant women with a Medicaid card; and children who have applied for Medicaid or [place] Health Choice, but were declined, whose parents can pay a reduced fee at time of service.  °[facility] ° [address], [place] (336) 641-4533 Patients are seen by appointment only. Walk-ins are not accepted. [facility] will see patients 18 years of age and older. °Monday - Tuesday (8am-5pm) °Most Wednesdays (8:30-5pm) °$30 per visit, cash only  °[facility] ° [address], [place] (336) 641-4533 Patients are seen by appointment only. Walk-ins are not accepted. [facility] will see patients 18 years of age and older. °One Wednesday Evening (Monthly: Volunteer Based).  $30 per visit, cash only  °[facility]  (919) 537-3737 for adults; Children under age 4, call Graduate Pediatric Dentistry at ([phone]) [phone]. Children aged 4-14, please call (919) [phone] to request a pediatric application. ° Dental services are provided in all areas of dental care including fillings, crowns and bridges, complete and partial dentures, implants, gum treatment, root canals, and extractions. Preventive care is also provided. Treatment is provided to both adults and children. °Patients are selected via a lottery and there is often a waiting list. °  °[facility] [address], °[place] ° (336) 763-8833 [email or web address] °  °[facility] [address], [place], [place] (336)723-1848, Ext. 123 Second and Fourth Thursday of each month, opens at 6:30 AM; Clinic ends at 9 AM.  Patients are seen on a first-come first-served basis, and a limited number are seen during each clinic.  ° °[facility] ° [address], [place], [place] (336) 723-7904   Eligibility Requirements °You must have lived in [place], [place], or [place] counties for at least the last three months. °  You  cannot be eligible for state or federal sponsored healthcare insurance, including Veterans Administration, Medicaid, or Medicare. °  You generally cannot be eligible for healthcare insurance through your employer.  °  How to apply: °Eligibility screenings are held every Tuesday and Wednesday afternoon from 1:00 pm until 4:00 pm. You do not need an appointment for the interview!  °[facility] [address], [place], [place] 336-631-2330   °[facility]  336-342-8273   °[facility]  336-703-3100   °[facility]  336-570-6415   ° °Behavioral Health Resources in the Community: °Intensive Outpatient Programs °Organization         Address  Phone  Notes  °[facility] [address], [place], [place] 336-878-6098   °[facility] [address], [place], [place] 336-832-9800   °[facility] [address], [place], [place] ° 336-882-2125   °[facility] [address],  °[place] 1-800-853-5163 or 336-641-4981   °Substance Abuse Resources °Organization         Address  Phone  Notes  °[facility]  336-882-2125   °[facility]  336-784-9470   °[facility]  336-285-9073   °[facility]  336-845-3988   °Residential & Outpatient Substance Abuse Program  1-800-659-3381   °Psychological Services °Organization         Address  Phone  Notes  ° Health  336- 832-9600   °[facility]  336- 378-7881   °[facility] [address], [place] 1-800-853-5163 or 336-641-4981   ° °Mobile Crisis Teams °Organization         Address  Phone  Notes  °[facility], Mobile Crisis Care Unit  1-877-626-1772   °Assertive °[facility] ° [address] [place], [place] 336-834-9664   °Sharon DeEsch [address] °[place] 336-554-5454   ° °Self-Help/Support  Groups °Organization         Address  Phone             Notes  °[facility] [place] - variety of support groups  336- 373-1402 Call for more information  °Narcotics Anonymous (NA), [facility] [address], °[place]  2 meetings at this location  ° °Residential Treatment Programs °Organization         Address  Phone  Notes  °[facility] [address],    °[place]  1-866-801-8205   °[facility] ° [address], [place], [place] 704-293-8524   °[facility] [address], [place] 336-845-3988 Admissions: 8am-3pm M-F  °[facility] [address]    °[place], [place] 336-841-1104   °[facility] [address], [place], [place] 336-379-7146   °[facility] [address]  °[place], [place] 336-285-9073   °[facility] Intensive Outpatient [address], [place], [place] 336-852-3033   °[facility] [address]  °[place], [place] 1-877-615-2722 or 336-784-9470   °[facility] [address] [place], [place] 336-227-7417 Accepts Medicaid  °[facility] [address]  °[place] 1-800-659-3381 Substance Abuse/Addiction Treatment  ° °[place] Behavioral Health Resources °Organization         Address  Phone  Notes  °[facility]  (888) 581-9988   °Julie Brannon, PhD [address] [place], [place]   (336) 349-5553 or (336) 951-0000   °[facility]   [address] °[place], [place] (336) 349-4454   °[facility] [address], [place], [place] (336) 342-8316 Insurance/Medicaid/sponsorship through [facility]  °[facility] [address]                                    [place], [place] (336) 342-8316 Therapy/tele-psych/case  °[facility]   [address]  ° [place], [place] (336) 349-2233    °Dr. Arfeen  (336) 349-4544   °[facility] [place]  [employer] [place] [facility] 1) [address], [place] °2) [address], [place] °3)  [address], [place] (336) 349-3220 °(336) 342-7768 ° °(336) 342-8140   °[place] Child Abuse Hotline (336) 342-1394 or (336) 342-3537 (After Hours)    ° ° ° °

## 2014-01-22 NOTE — ED Provider Notes (Signed)
CSN: [id number]     Arrival date & time 01/21/14  2332 History   First MD Initiated Contact with Patient 01/22/14 0012     Chief Complaint  Patient presents with  . Dental Pain    (Consider location/radiation/quality/duration/timing/severity/associated sxs/prior Treatment) HPI Comments: 43 year old male with history of diverticulitis, hypertension, depression, and esophageal reflux presents to the emergency department for further evaluation of right lower dental pain. Patient states the pain began 2 days ago and has been progressively worsening since this time. He states the pain is throbbing and worse with weather changes. He has taken Aleve and ibuprofen for symptoms without improvement today. He also states that he drank a pint of liquor to try and curb the pain yesterday. Currently he rates his pain at 10/10. Symptoms associated with some mild facial swelling. No fevers, oral bleeding, pus draining in the mouth, inability to open the jaw, difficulty swallowing, or trauma/injury to the mouth or face.  Patient is a 43 y.o. male presenting with tooth pain.  Dental Pain Associated symptoms: facial swelling     Past Medical History  Diagnosis Date  . Anxiety   . Diverticulitis   . Gastroduodenitis   . Hypertension     bp runs high sometimes, no bp meds  . Depression   . GERD (gastroesophageal reflux disease)     no meds for  . Blood in stool 5--31-2015   Past Surgical History  Procedure Laterality Date  . Colonoscopy N/A 05/09/2013    Procedure: COLONOSCOPY;  Surgeon: Irene Shipper, MD;  Location: [facility];  Service: Endoscopy;  Laterality: N/A;  . Esophagogastroduodenoscopy N/A 05/09/2013    Procedure: ESOPHAGOGASTRODUODENOSCOPY (EGD);  Surgeon: Irene Shipper, MD;  Location: [facility];  Service: Endoscopy;  Laterality: N/A;  possible egd depending on colon results  . Tonsillectomy  as child  . Laparoscopic appendectomy N/A 08/12/2013    Procedure: LAPAROSCOPIC ASSISTED  APPENDECTOMY;  Surgeon: Adin Hector, MD;  Location: [facility];  Service: General;  Laterality: N/A;  . Bowel resection N/A 08/12/2013    Procedure: SMALL BOWEL ILEAL RESECTION;  Surgeon: Adin Hector, MD;  Location: [facility];  Service: General;  Laterality: N/A;   Family History  Problem Relation Age of Onset  . Diabetes Paternal Grandmother   . Hypertension Paternal Grandmother   . Diabetes Paternal Uncle   . Diabetes Paternal Aunt   . Liver disease Paternal Uncle   . Hypertension Paternal Aunt   . Anuerysm Mother     brain   History  Substance Use Topics  . Smoking status: Current Every Day Smoker -- 0.50 packs/day for 10 years    Types: Cigarettes  . Smokeless tobacco: Never Used  . Alcohol Use: Yes     Comment: 2-3 beers some days    Review of Systems  HENT: Positive for dental problem and facial swelling.   All other systems reviewed and are negative.   Allergies  Review of patient's allergies indicates no known allergies.  Home Medications   Prior to Admission medications   Medication Sig Start Date End Date Taking? Authorizing Provider  doxycycline (VIBRA-TABS) 100 MG tablet Take 1 tablet (100 mg total) by mouth 2 (two) times daily. 12/22/13   [name], MD  fluticasone (FLONASE) 50 MCG/ACT nasal spray Place 2 sprays into both nostrils daily. 11/20/13   [name], MD  HYDROcodone-acetaminophen (NORCO/VICODIN) 5-325 MG per tablet Take 1-2 tablets by mouth every 6 (six) hours as needed for severe pain.  01/22/14   Antonietta Breach, PA-C  losartan (COZAAR) 50 MG tablet Take 1 tablet (50 mg total) by mouth daily. 11/20/13   [name], MD  naproxen (NAPROSYN) 500 MG tablet Take 1 tablet (500 mg total) by mouth 2 (two) times daily with a meal. 09/29/13   Michael Boston, MD  oxyCODONE (OXY IR/ROXICODONE) 5 MG immediate release tablet Take 1-2 tablets (5-10 mg total) by mouth every 4 (four) hours as needed for moderate pain, severe pain or breakthrough pain.  10/21/13   Michael Boston, MD  penicillin v potassium (VEETID) 500 MG tablet Take 1 tablet (500 mg total) by mouth 4 (four) times daily. 01/22/14 01/29/14  Antonietta Breach, PA-C  SUMAtriptan (IMITREX) 50 MG tablet Take 1 tablet (50 mg total) by mouth once. May repeat in 2 hours if headache persists or recurs. 11/20/13   [name], MD   BP 125/88 mmHg  Pulse 96  Temp(Src) 98.3 F (36.8 C)  Resp 24  SpO2 98%   Physical Exam  Constitutional: He is oriented to person, place, and time. He appears well-developed and well-nourished. No distress.  Nontoxic/nonseptic appearing  HENT:  Head: Normocephalic and atraumatic.  Mouth/Throat: Uvula is midline, oropharynx is clear and moist and mucous membranes are normal. No oral lesions. No trismus in the jaw. Abnormal dentition. Dental caries present. No uvula swelling.    TTP to R lower 1st molar with mild gingival swelling at base of tooth without fluctuance. No trismus. Patient tolerating secretions.  Eyes: Conjunctivae and EOM are normal. No scleral icterus.  Neck: Normal range of motion.  Pulmonary/Chest: Effort normal. No respiratory distress.  Chest expansion symmetric  Musculoskeletal: Normal range of motion.  Neurological: He is alert and oriented to person, place, and time. He exhibits normal muscle tone. Coordination normal.  Skin: Skin is warm and dry. No rash noted. He is not diaphoretic. No erythema. No pallor.  Psychiatric: He has a normal mood and affect. His behavior is normal.  Nursing note and vitals reviewed.   ED Course  Dental Date/Time: 01/22/2014 1:51 AM Performed by: Antonietta Breach Authorized by: Antonietta Breach Consent: The procedure was performed in an emergent situation. Verbal consent obtained. Written consent not obtained. Risks and benefits: risks, benefits and alternatives were discussed Consent given by: patient Patient understanding: patient states understanding of the procedure being performed Patient consent:  the patient's understanding of the procedure matches consent given Procedure consent: procedure consent matches procedure scheduled Relevant documents: relevant documents present and verified Test results: test results available and properly labeled Site marked: the operative site was marked Imaging studies: imaging studies available Required items: required blood products, implants, devices, and special equipment available Patient identity confirmed: verbally with patient and arm band Time out: Immediately prior to procedure a "time out" was called to verify the correct patient, procedure, equipment, support staff and site/side marked as required. Preparation: Patient was prepped and draped in the usual sterile fashion. Local anesthesia used: yes Anesthesia: local infiltration Local anesthetic: bupivacaine 0.5% with epinephrine Anesthetic total: 1.8 ml Patient sedated: no Patient tolerance: Patient tolerated the procedure well with no immediate complications Comments: Inferior alveolar block for dental pain   (including critical care time) Labs Review Labs Reviewed - No data to display  Imaging Review No results found.   EKG Interpretation None      MDM   Final diagnoses:  Dentalgia    Patient with toothache. No gross abscess, though gingival swelling is noted. Exam unconcerning for Ludwig's angina  or spread of infection. Will treat with penicillin and pain medicine. Urged patient to follow-up with dentist. Referral and resource guide provided. Patient agreeable to plan with no unaddressed concerns.   Filed Vitals:   01/22/14 0023  BP: 125/88  Pulse: 96  Temp: 98.3 F (36.8 C)  Resp: 24  SpO2: 98%        Antonietta Breach, PA-C 01/22/14 [place], MD 01/22/14 [phone]

## 2014-01-22 NOTE — ED Notes (Signed)
Pt presents with lower right side tooth pain, states onset 2 days ago. Mild swelling, pain 10/10.

## 2014-01-23 ENCOUNTER — Encounter: Payer: Self-pay | Admitting: Family Medicine

## 2014-02-06 ENCOUNTER — Ambulatory Visit (INDEPENDENT_AMBULATORY_CARE_PROVIDER_SITE_OTHER): Payer: 59 | Admitting: Neurology

## 2014-02-06 ENCOUNTER — Encounter: Payer: Self-pay | Admitting: Neurology

## 2014-02-06 VITALS — BP 122/86 | HR 76 | Temp 98.0°F | Resp 16 | Ht 69.0 in | Wt 225.5 lb

## 2014-02-06 DIAGNOSIS — G5603 Carpal tunnel syndrome, bilateral upper limbs: Secondary | ICD-10-CM

## 2014-02-06 DIAGNOSIS — G43709 Chronic migraine without aura, not intractable, without status migrainosus: Secondary | ICD-10-CM

## 2014-02-06 DIAGNOSIS — Z8249 Family history of ischemic heart disease and other diseases of the circulatory system: Secondary | ICD-10-CM

## 2014-02-06 DIAGNOSIS — G4440 Drug-induced headache, not elsewhere classified, not intractable: Secondary | ICD-10-CM

## 2014-02-06 DIAGNOSIS — G5602 Carpal tunnel syndrome, left upper limb: Secondary | ICD-10-CM

## 2014-02-06 DIAGNOSIS — G4441 Drug-induced headache, not elsewhere classified, intractable: Secondary | ICD-10-CM

## 2014-02-06 DIAGNOSIS — Z720 Tobacco use: Secondary | ICD-10-CM

## 2014-02-06 DIAGNOSIS — G5601 Carpal tunnel syndrome, right upper limb: Secondary | ICD-10-CM

## 2014-02-06 MED ORDER — SERTRALINE HCL 50 MG PO TABS: 50.0000 mg | ORAL_TABLET | Freq: Every day | ORAL | Status: DC

## 2014-02-06 MED ORDER — SUMATRIPTAN SUCCINATE 100 MG PO TABS: 100.0000 mg | ORAL_TABLET | Freq: Once | ORAL | Status: DC | PRN

## 2014-02-06 NOTE — Progress Notes (Signed)
NEUROLOGY CONSULTATION NOTE  Khian Remo MRN: [id number] DOB: 09-20-1970  Referring provider: Dr. Birdie Riddle Primary care provider: Dr. 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  Records and head CT was personally reviewed.  Onset:  Mid-30s Location:  Right frontal Quality:  Dull and gradually progresses to throbbing Intensity:  2/10 dull, 9/10 severe Aura:  no Prodrome:  no Associated symptoms:  Photophobia, phonophobia, blurred vision.  Occasional nausea.  No vomiting. Duration:  2-4 hours Frequency:  Daily (severe headaches occur 15 days/month) Triggers/exacerbating factors:  nothing Relieving factors:  rest Activity:  Able to force self to work.  He works as a Actor  Past abortive therapy:  Aleve (dulls pain), Tylenol (ineffective), Vicodin (dulls pain) Past preventative therapy:  none  Current abortive therapy:  BC powder, Goodys (takes 15 days per month) Current preventative therapy:  ** Other medications:  Cozaar  Caffeine:  occasionally Alcohol:  occasionally Smoker:  Yes.  Trying to quit Diet:  good Exercise:  As per work Depression/stress:  stress Sleep hygiene:  poor Family history of headache:  Brother.  Mother had intracranial aneurysm CT of head from 04/01/13 was unremarkable.  He also reports bilateral hand numbness.  It occurs in bed and while driving.  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  He has to shake out his hands.  No radicular pain from the neck.  PAST MEDICAL HISTORY: Past Medical History  Diagnosis Date  . Anxiety   . Diverticulitis   . Gastroduodenitis   . Hypertension     bp runs high sometimes, no bp meds  . Depression   . GERD (gastroesophageal reflux disease)     no meds for  . Blood in stool  5--31-2015  . Headache     PAST SURGICAL HISTORY: Past Surgical History  Procedure Laterality Date  . Colonoscopy N/A 05/09/2013    Procedure: COLONOSCOPY;  Surgeon: Irene Shipper, MD;  Location: [facility];  Service: Endoscopy;  Laterality: N/A;  . Esophagogastroduodenoscopy N/A 05/09/2013    Procedure: ESOPHAGOGASTRODUODENOSCOPY (EGD);  Surgeon: Irene Shipper, MD;  Location: [facility];  Service: Endoscopy;  Laterality: N/A;  possible egd depending on colon results  . Tonsillectomy  as child  . Laparoscopic appendectomy N/A 08/12/2013    Procedure: LAPAROSCOPIC ASSISTED APPENDECTOMY;  Surgeon: Adin Hector, MD;  Location: [facility];  Service: General;  Laterality: N/A;  . Bowel resection N/A 08/12/2013    Procedure: SMALL BOWEL ILEAL RESECTION;  Surgeon: Adin Hector, MD;  Location: [facility];  Service: General;  Laterality: N/A;    MEDICATIONS: Current Outpatient Prescriptions on File Prior to Visit  Medication Sig Dispense Refill  . doxycycline (VIBRA-TABS) 100 MG tablet Take 1 tablet (100 mg total) by mouth 2 (two) times daily. 20 tablet 0  . fluticasone (FLONASE) 50 MCG/ACT nasal spray Place 2 sprays into both nostrils daily. 16 g 6  . HYDROcodone-acetaminophen (NORCO/VICODIN) 5-325 MG per tablet Take 1-2 tablets by mouth every 6 (six) hours as needed for severe pain. 11 tablet 0  . losartan (COZAAR) 50 MG tablet Take 1 tablet (50 mg total) by mouth daily. 90 tablet 3  . naproxen (NAPROSYN) 500 MG tablet Take 1 tablet (500 mg total) by mouth 2 (two) times daily with a meal. (Patient not  taking: Reported on 02/06/2014) 40 tablet 1  . oxyCODONE (OXY IR/ROXICODONE) 5 MG immediate release tablet Take 1-2 tablets (5-10 mg total) by mouth every 4 (four) hours as needed for moderate pain, severe pain or breakthrough pain. (Patient not taking: Reported on 02/06/2014) 40 tablet 0   No current facility-administered medications on file prior to visit.    ALLERGIES: No Known Allergies  FAMILY  HISTORY: Family History  Problem Relation Age of Onset  . Diabetes Paternal Grandmother   . Hypertension Paternal Grandmother   . Diabetes Paternal Uncle   . Diabetes Paternal Aunt   . Liver disease Paternal Uncle   . Hypertension Paternal Aunt   . Anuerysm Mother     brain    SOCIAL HISTORY: History   Social History  . Marital Status: Single    Spouse Name: N/A    Number of Children: 6  . 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 Smoking status: Current Every Day Smoker -- 0.50 packs/day for 10 years    Types: Cigarettes  . Smokeless tobacco: Never Used  . Alcohol Use: 0.0 oz/week    0 Not specified per week     Comment: 2-3 beers some days  . Drug Use: Yes    Special: Marijuana  . Sexual Activity:    Partners: Female   Other Topics Concern  . Not on file   Social History Narrative    REVIEW OF SYSTEMS: Constitutional: No fevers, chills, or sweats, no generalized fatigue, change in appetite Eyes: No visual changes, double vision, eye pain Ear, nose and throat: No hearing loss, ear pain, nasal congestion, sore throat Cardiovascular: No chest pain, palpitations Respiratory:  No shortness of breath at rest or with exertion, wheezes GastrointestinaI: No nausea, vomiting, diarrhea, abdominal pain, fecal incontinence Genitourinary:  No dysuria, urinary retention or frequency Musculoskeletal:  No neck pain, back pain Integumentary: No rash, pruritus, skin lesions Neurological: as above Psychiatric: Insomnia, stress Endocrine: No palpitations, fatigue, diaphoresis, mood swings, change in appetite, change in weight, increased thirst Hematologic/Lymphatic:  No anemia, purpura, petechiae. Allergic/Immunologic: no itchy/runny eyes, nasal congestion, recent allergic reactions, rashes  PHYSICAL EXAM: Filed Vitals:   02/06/14 0832  BP: 122/86  Pulse: 76  Temp: 98 F (36.7 C)  Resp: 16    General: No acute distress Head:  Normocephalic/atraumatic Eyes:  fundi unremarkable, without vessel changes, exudates, hemorrhages or papilledema. Neck: supple, no paraspinal tenderness, full range of motion Back: No paraspinal tenderness Heart: regular rate and rhythm Lungs: Clear to auscultation bilaterally. Vascular: No carotid bruits. Neurological Exam: Mental status: alert and oriented to person, place, and time, recent and remote memory intact, fund of knowledge intact, attention and concentration intact, speech fluent and not dysarthric, language intact. Cranial nerves: CN I: not tested CN II: pupils equal, round and reactive to light, visual fields intact, fundi unremarkable, without vessel changes, exudates, hemorrhages or papilledema. CN III, IV, VI:  full range of motion, no nystagmus, no ptosis CN V: facial sensation intact CN VII: endorses numbness in right V2 distribution CN VIII: hearing intact CN IX, X: gag intact, uvula midline CN XI: sternocleidomastoid and trapezius muscles intact CN XII: tongue midline Bulk & Tone: normal, no fasciculations. Motor:  5/5 throughout Sensation:  Temperature and vibration intact Deep Tendon Reflexes:  2+ throughout, toes downgoing Finger to nose testing:  No dysmetria Gait:  Normal station and  stride.  Able to turn and walk in tandem. Romberg negative. Positive Tinel's bilaterally  IMPRESSION: Chronic migraine without aura Medication-overuse headache Probable bilateral carpal tunnel syndrome Family history of intracranial aneurysm Tobacco abuse  PLAN: 1.  Zoloft 50mg  daily for preventative therapy 2.  Sumatriptan 100mg  for abortive therapy 3.  MRI and MRA of head 4.  Smoking cessation 5.  Wrist splints.  If ineffective, will schedule 6.  Follow up in 4 weeks.  Thank you for allowing me to take part in the care of this patient.  Metta Clines, DO  CC: Annye Asa, MD

## 2014-02-06 NOTE — Patient Instructions (Addendum)
1.  Start Zoloft 50mg  at bedtime.   2.  At earliest onset of headache, take sumatriptan 100mg .  May repeat once in 2 hours if needed.  Do not exceed 2 tablets in 24 hours. 3.  Stop BC and Goodys 4.  Limit use of pain relievers to no more than 2 days out of the week to prevent rebound headache 5.  Will get MRI of brain without contrast and MRA of head 6.  Follow up in 4 weeks. 7.  Try to quit smoking

## 2014-02-16 ENCOUNTER — Telehealth: Payer: Self-pay | Admitting: Hematology

## 2014-02-16 NOTE — Telephone Encounter (Signed)
NOT ABLE TO REACH PT BY PHONE OR LM RE NEW APPT. MOVED FROM [facility] TO [facility] 12/30. LAB STILL REMAINS 12/21. SCHEDULE MAILED.

## 2014-02-17 ENCOUNTER — Other Ambulatory Visit: Payer: Self-pay | Admitting: *Deleted

## 2014-02-17 ENCOUNTER — Telehealth: Payer: Self-pay | Admitting: *Deleted

## 2014-02-17 DIAGNOSIS — R51 Headache: Principal | ICD-10-CM

## 2014-02-17 DIAGNOSIS — Z8249 Family history of ischemic heart disease and other diseases of the circulatory system: Secondary | ICD-10-CM

## 2014-02-17 DIAGNOSIS — R519 Headache, unspecified: Secondary | ICD-10-CM

## 2014-02-17 NOTE — Telephone Encounter (Signed)
Patient  mailbox is full  I called to let him know that he has appt for MRI and MRA  [facility] on 02/26/14 11:45am  I also called [place] no mailbox avalible either

## 2014-02-19 NOTE — Telephone Encounter (Signed)
error 

## 2014-02-23 ENCOUNTER — Ambulatory Visit (HOSPITAL_BASED_OUTPATIENT_CLINIC_OR_DEPARTMENT_OTHER): Payer: 59 | Admitting: Hematology

## 2014-02-23 DIAGNOSIS — C7A098 Malignant carcinoid tumors of other sites: Secondary | ICD-10-CM

## 2014-02-23 LAB — CBC WITH DIFFERENTIAL/PLATELET
BASO%: 0.5 % (ref 0.0–2.0)
BASOS ABS: 0 10*3/uL (ref 0.0–0.1)
EOS ABS: 0.2 10*3/uL (ref 0.0–0.5)
EOS%: 3.4 % (ref 0.0–7.0)
HCT: 41.5 % (ref 38.4–49.9)
HGB: 14 g/dL (ref 13.0–17.1)
LYMPH#: 2.6 10*3/uL (ref 0.9–3.3)
LYMPH%: 44.8 % (ref 14.0–49.0)
MCH: 28.1 pg (ref 27.2–33.4)
MCHC: 33.7 g/dL (ref 32.0–36.0)
MCV: 83.2 fL (ref 79.3–98.0)
MONO#: 0.5 10*3/uL (ref 0.1–0.9)
MONO%: 8.8 % (ref 0.0–14.0)
NEUT#: 2.5 10*3/uL (ref 1.5–6.5)
NEUT%: 42.5 % (ref 39.0–75.0)
Platelets: 268 10*3/uL (ref 140–400)
RBC: 4.99 10*6/uL (ref 4.20–5.82)
RDW: 13.7 % (ref 11.0–14.6)
WBC: 5.8 10*3/uL (ref 4.0–10.3)

## 2014-02-23 LAB — COMPREHENSIVE METABOLIC PANEL (CC13)
ALT: 31 U/L (ref 0–55)
AST: 23 U/L (ref 5–34)
Albumin: 3.8 g/dL (ref 3.5–5.0)
Alkaline Phosphatase: 75 U/L (ref 40–150)
Anion Gap: 8 mEq/L (ref 3–11)
BUN: 9.8 mg/dL (ref 7.0–26.0)
CALCIUM: 9.4 mg/dL (ref 8.4–10.4)
CO2: 28 mEq/L (ref 22–29)
Chloride: 106 mEq/L (ref 98–109)
Creatinine: 1.3 mg/dL (ref 0.7–1.3)
EGFR: 77 mL/min/{1.73_m2} — ABNORMAL LOW (ref >90)
Glucose: 95 mg/dl (ref 70–140)
Potassium: 4.3 mEq/L (ref 3.5–5.1)
Sodium: 141 mEq/L (ref 136–145)
Total Bilirubin: 0.69 mg/dL (ref 0.20–1.20)
Total Protein: 7 g/dL (ref 6.4–8.3)

## 2014-02-23 LAB — LACTATE DEHYDROGENASE (CC13): LDH: 161 U/L (ref 125–245)

## 2014-02-26 ENCOUNTER — Ambulatory Visit (HOSPITAL_COMMUNITY)
Admission: RE | Admit: 2014-02-26 | Discharge: 2014-02-26 | Disposition: A | Discharge status: Home / Self Care | Payer: 59 | Source: Ambulatory Visit | Attending: Neurology | Admitting: Neurology

## 2014-02-26 DIAGNOSIS — Z8249 Family history of ischemic heart disease and other diseases of the circulatory system: Secondary | ICD-10-CM | POA: Diagnosis not present

## 2014-02-26 DIAGNOSIS — G4441 Drug-induced headache, not elsewhere classified, intractable: Secondary | ICD-10-CM | POA: Diagnosis not present

## 2014-02-26 DIAGNOSIS — F17200 Nicotine dependence, unspecified, uncomplicated: Secondary | ICD-10-CM | POA: Insufficient documentation

## 2014-02-26 DIAGNOSIS — G43709 Chronic migraine without aura, not intractable, without status migrainosus: Secondary | ICD-10-CM | POA: Insufficient documentation

## 2014-02-26 DIAGNOSIS — F329 Major depressive disorder, single episode, unspecified: Secondary | ICD-10-CM | POA: Insufficient documentation

## 2014-02-26 DIAGNOSIS — I10 Essential (primary) hypertension: Secondary | ICD-10-CM | POA: Insufficient documentation

## 2014-02-26 DIAGNOSIS — G4440 Drug-induced headache, not elsewhere classified, not intractable: Secondary | ICD-10-CM

## 2014-02-26 DIAGNOSIS — Z720 Tobacco use: Secondary | ICD-10-CM

## 2014-02-27 LAB — CHROMOGRANIN A

## 2014-03-02 ENCOUNTER — Ambulatory Visit: Payer: 59

## 2014-03-03 ENCOUNTER — Telehealth: Payer: Self-pay | Admitting: *Deleted

## 2014-03-03 NOTE — Telephone Encounter (Signed)
-----   Message from Dudley Major, DO sent at 03/02/2014  7:45 PM EST ----- MRI and MRA of brain unremarkable except for mild changes consistent with history of hypertension and tobacco abuse.  Nothing worrisome to explain headaches. ----- Message -----    From: Rad Results In Interface    Sent: 02/26/2014   1:16 PM      To: Dudley Major, DO

## 2014-03-03 NOTE — Telephone Encounter (Signed)
patient is aware of normal MRI MRA

## 2014-03-04 ENCOUNTER — Ambulatory Visit (HOSPITAL_BASED_OUTPATIENT_CLINIC_OR_DEPARTMENT_OTHER): Payer: 59 | Admitting: Hematology

## 2014-03-04 ENCOUNTER — Telehealth: Payer: Self-pay | Admitting: Hematology

## 2014-03-04 ENCOUNTER — Encounter: Payer: Self-pay | Admitting: Hematology

## 2014-03-04 VITALS — BP 145/69 | HR 76 | Temp 98.3°F | Resp 18 | Ht 69.0 in | Wt 219.3 lb

## 2014-03-04 DIAGNOSIS — R5383 Other fatigue: Secondary | ICD-10-CM

## 2014-03-04 DIAGNOSIS — M25572 Pain in left ankle and joints of left foot: Secondary | ICD-10-CM

## 2014-03-04 DIAGNOSIS — C7A012 Malignant carcinoid tumor of the ileum: Secondary | ICD-10-CM

## 2014-03-04 DIAGNOSIS — R51 Headache: Secondary | ICD-10-CM

## 2014-03-04 DIAGNOSIS — G4700 Insomnia, unspecified: Secondary | ICD-10-CM

## 2014-03-04 DIAGNOSIS — D3A012 Benign carcinoid tumor of the ileum: Secondary | ICD-10-CM

## 2014-03-04 NOTE — Telephone Encounter (Signed)
gv adn printed appt sched and avs for pt for March 2016 °

## 2014-03-04 NOTE — Progress Notes (Signed)
[address] [phone]   [phone]  ONCOLOGY FOLLOW UP NOTE   Name: Leslie Mcmillan Date: 03/04/2014 MRN: [id number] DOB: 08-30-1970  PCP: Annye Asa, MD   REFERRING PHYSICIAN: Michael Boston, MD  PATIENT IDENTIFICATION: Leslie Mcmillan  HISTORY OF PRESENT ILLNESS:Leslie Mcmillan is a 43 y.o. 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 Johney Maine.     He reports that in winter of 2012, he abdominal pains described as tense muscle spasms, sharp pains that were severe in quality.  He went to the emergency room at [facility] and he reports that he was told that he inflammation and provided antibiotics and ibuprofen. CT of abdomen on 10/25/2010 showed that there was mild inflammation surrounding the proximal jejunum near the ligament of Treitz, findings which could represent focal enteritis. He had later complained of some severe headaches causing an emergency room visit [facility] and he had a CT of head in January 2015 which showed no acute intracranial abnormality.   He notes that there was slow progression of his abdominal pain over time.  Eventually, due to it recurrence and progression, he presented to [facility] ER and CT of abdomen and pelvis was unrevealing on 05/05/2013.  Due to progressive abdominal pain and rectal bleeding, he was referred to [facility] (Dr. Hilarie Fredrickson).  He had a Meckels scan on 05/22/2013  and MRI (05/30/2013) and capsule endoscopy.  Meckel's scan was without scintigraphic evidence of ectopic gastric mucosa and demonstrated that there was a subtle round soft tissue mass within the right lower quadrant which was difficult to distinguish from the adjacent loops of small bowel. This small mass measured 2.1 x 2.4 cm on 10/25/2010 increased slowly to 2.8 x 2.7 cm on CT of 05/05/2013. Common differential for this small slow growing lesion would be a  Leslie tumor or a gastrointestinal stromal tumor. No retractile pattern typical of Leslie.    MRI of abdomen and pelvis on 05/30/2013 showed a diffusely enhacing 3.0 x 2.0 x 2.2 cm mass in the  new mesentery with close associated mesenteric vessels but the lesion did not appear to invade the small bowel.  As noted on the Mecke's can, top differential diagnostic consideration would be GIST or Leslie tumor.  Lymphoma was not completely excluded. Of note, he also had several jejunal loops that were somewhat featureless which could be due to fold thickening or effacement.   Based on results of these studies, he was referred to to Surgery (Dr. Johney Maine).  He was admitted for mesenteric mass and present Leslie and underwent laparoscopic assisted Mcmillan of ileum and ymph nodes on 08/12/2013.  There were no complications.  His pathology noted Leslie with 3/6 lymph nodes positive.  Medical oncology consultation was obtained and patient seen by Dr Juliann Mule on 09/04/2013.      INTERVAL HISTORY: He returns for follow up. He woke up from sleep yesterday and developed severe left ankle pain, no prior injury or fall. No significant ankle swelling or redness, no fever or chill. He reports low appetite in the pat 2-3 weeks, he was put of zoloft for depression/anxiety 2-3 weeks ago by his neurologist. No other new GI symptoms. He denies any pain, cramps, he has loos BM once dail. No hot flush.     PAST MEDICAL HISTORY:  Past Medical History  Diagnosis Date  . Anxiety   . Diverticulitis   .  Gastroduodenitis   . Hypertension     bp runs high sometimes, no bp meds  . Depression   . GERD (gastroesophageal reflux disease)     no meds for  . Blood in stool 5--31-2015  . Headache      PAST SURGICAL HISTORY: Past Surgical History  Procedure Laterality Date  . Colonoscopy N/A 05/09/2013    Procedure: COLONOSCOPY;  Surgeon: Irene Shipper, MD;  Location: [facility] ENDOSCOPY;  Service: Endoscopy;  Laterality: N/A;    . Esophagogastroduodenoscopy N/A 05/09/2013    Procedure: ESOPHAGOGASTRODUODENOSCOPY (EGD);  Surgeon: Irene Shipper, MD;  Location: [facility];  Service: Endoscopy;  Laterality: N/A;  possible egd depending on colon results  . Tonsillectomy  as child  . Laparoscopic appendectomy N/A 08/12/2013    Procedure: LAPAROSCOPIC ASSISTED APPENDECTOMY;  Surgeon: Adin Hector, MD;  Location: [facility] ORS;  Service: General;  Laterality: N/A;  . Bowel Mcmillan N/A 08/12/2013    Procedure: SMALL BOWEL ILEAL Mcmillan;  Surgeon: Adin Hector, MD;  Location: [facility] ORS;  Service: General;  Laterality: N/A;     CURRENT MEDICATIONS: has a current medication list which includes the following prescription(s): fluticasone, losartan, sertraline, sumatriptan, hydrocodone-acetaminophen, naproxen, and oxycodone.   ALLERGIES: Review of patient's allergies indicates no known allergies.   SOCIAL HISTORY:  reports that he has been smoking Cigarettes.  He has a 5 pack-year smoking history. He has never used smokeless tobacco. He reports that he drinks alcohol. He reports that he uses illicit drugs (Marijuana).   FAMILY HISTORY: family history includes Anuerysm in his mother; Diabetes in his paternal aunt, paternal grandmother, and paternal uncle; Hypertension in his paternal aunt and paternal grandmother; Liver disease in his paternal uncle.   LABORATORY DATA:  CBC Latest Ref Rng 02/23/2014 11/20/2013 11/06/2013  WBC 4.0 - 10.3 10e3/uL 5.8 6.7 7.2  Hemoglobin 13.0 - 17.1 g/dL 14.0 13.6 13.2  Hematocrit 38.4 - 49.9 % 41.5 40.9 39.3  Platelets 140 - 400 10e3/uL 268 234.0 244    CMP Latest Ref Rng 02/23/2014 12/22/2013 11/20/2013  Glucose 70 - 140 mg/dl 95 180(H) 180(H)  BUN 7.0 - 26.0 mg/dL 9.'8 9 14  ' Creatinine 0.7 - 1.3 mg/dL 1.3 1.4 1.6(H)  Sodium 136 - 145 mEq/L 141 140 139  Potassium 3.5 - 5.1 mEq/L 4.3 3.9 3.5  Chloride 96 - 112 mEq/L - 105 104  CO2 22 - 29 mEq/L '28 28 28  ' Calcium 8.4 - 10.4 mg/dL 9.4 9.0 9.1   Total Protein 6.4 - 8.3 g/dL 7.0 - -  Total Bilirubin 0.20 - 1.20 mg/dL 0.69 - -  Alkaline Phos 40 - 150 U/L 75 - -  AST 5 - 34 U/L 23 - -  ALT 0 - 55 U/L 31 - -   Chromogranin A  Status: Finalresult Visible to patient:  Not Released Nextappt: 03/24/2014 at 10:45 AM in [place] Annye Asa, MD)           Ref Range 9d ago  56moago  613mogo     Chromogranin A <= 15 ng/mL <5 18 (H)R, CM 3.0R, CM        RADIOGRAPHY:  MR ABDOMEN W WO CONTRAST 11/06/2013  CLINICAL DATA: Leslie tumor of ileum. Surgery 6/15. Intermittent abdominal pain. EXAM: MRI ABDOMEN WITHOUT AND WITH CONTRAST  TECHNIQUE: Multiplanar multisequence MR imaging of the abdomen was performed both before and after the administration of intravenous contrast.  CONTRAST: 1852mULTIHANCE GADOBENATE DIMEGLUMINE 529 MG/ML IV SOLN 18 cc MultiHance  COMPARISON: 05/30/2013 and CT of 05/05/2013.  FINDINGS: Normal heart size without pericardial or pleural effusion. A tiny cyst in the right lobe of the liver on image 28 of series 8. No suspicious liver lesions and no abnormal foci of arterial hyper enhancement. Normal spleen, stomach, pancreas, gallbladder, biliary tract, adrenal glands, kidneys. No abdominal adenopathy or ascites. Normal caliber of abdominal bowel loops. Small jejunal mesenteric nodes are likely reactive. No evidence omental/peritoneal disease. IMPRESSION: No acute process or evidence of metastatic disease within the abdomen. Electronically Signed  By: Abigail Miyamoto M.D. On: 11/06/2013 11:31       PATHOLOGY:  Diagnosis 1. Appendix, Other than Incidental - BENIGN APPENDICEAL TISSUE WITH REACTIVE LYMPHOID HYPERPLASIA AND FIBROUS OBLITERANS. - NO EVIDENCE OF Leslie TUMOR. - NO EVIDENCE OF ACTIVE INFLAMMATION OR NEOPLASIA. 2. Small intestine, Mcmillan for tumor, ileal with ileal mass, mesenteric mass - WELL DIFFERENTIATED NEUROENDOCRINE TUMOR (Leslie TUMOR), INVADING THROUGH THE  MUSCULARIS PROPRIA INTO SUBSEROSAL TISSUE. - ANGIOLYMPHATIC INVASION PRESENT. - THREE OF SIX LYMPH NODES, POSITIVE FOR Leslie TUMOR (3/6). - Mcmillan MARGINS, NEGATIVE FOR ATYPIA OR MALIGNANCY. Microscopic Comment 2. SMALL INTESTINE - NEUROENDOCRINE: Specimen: Small bowel - ileum Procedure: Segmental Mcmillan Tumor Site: Ileum Tumor Size: 1.5 cm, gross measurement Tumor Focality: Unifocal Histologic Type: Well differentiated (low grade) neuroendocrine tumor (Leslie tumor). Histologic Grade : G1, Low grade Mitotic Rate < 2/10 high-power fields (HIGH POWER FIELD), Ki-67: <3% Microscopic Tumor Extension: Invading through the muscularis propria into subserosal tissue. Margins: Negative Proximal Margin: 8 cm Distal Margin: 19.5 cm Mesenteric (Radial) Margin : 10 cm from the mucosa lesion and 0.1 cm from the positive mesenteric lymph node, please see comment for details. Distance to the closest margin: please see comment for details.Lymph-Vascular Invasion: Present Perineural Invasion: Not identified Lymph nodes: number examined 6; number positive: 3. TNM Staging: pT3, pN1 1 of 3 FINAL for NORVIL, MARTENSEN ([id number]) Microscopic Comment(continued) Additional findings: N/A Comments: The mucosal mass in the ileum measures 1.5 cm. The tumor cells invade through the muscularis propria into the subserosal tissue without penetration of the inked serosa.Immunohistochemical stains were performed and Ki-67 rate is less than 3%. 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 The mucosa tumor is 10 cm from the mesenteric Mcmillan margin. The tumor in the partially replaced lymph node is 0.1 cm from the inked mesenteric margin. The case was discussed with Dr. Neysa Bonito on 08/13/2013. (HCL:kh 08/13/13) H. CATHERINE LI MDPathologist, Electronic Signature(Case signed 08/14/2013)   REVIEW OF SYSTEMS:    Constitutional: Denies fevers, chills or abnormal weight loss Eyes: Denies blurriness of vision Ears, nose, mouth, throat, and face: Denies mucositis or sore throat Respiratory: Denies cough, dyspnea or wheezes Cardiovascular: Denies palpitation, chest discomfort or lower extremity swelling Gastrointestinal:  Denies nausea, heartburn; diarrhea as noted in HPI.  Skin: Denies abnormal skin rashes Lymphatics: Denies new lymphadenopathy or easy bruising Neurological:Denies numbness, tingling or new weaknesses Behavioral/Psych: Mood is stable, no new changes  All other systems were reviewed with the patient and are negative.  PHYSICAL EXAM:  height is '5\' 9"'  (1.753 m) and weight is 219 lb 4.8 oz (99.474 kg). His oral temperature is 98.3 F (36.8 C). His blood pressure is 145/69 and his pulse is 76. His respiration is 18 and oxygen saturation is 99%.    GENERAL:alert, no distress and comfortable; anxious, well developed and well nourished.  SKIN: skin color, texture, turgor are normal, no rashes or significant lesions EYES:  normal, Conjunctiva are pink and non-injected, sclera clear OROPHARYNX:no exudate, no erythema and lips, buccal mucosa, and tongue normal  NECK: supple, thyroid normal size, non-tender, without nodularity LYMPH:  no palpable lymphadenopathy in the cervical, axillary or inguinal LUNGS: clear to auscultation and percussion with normal breathing effort HEART: regular rate & rhythm and no murmurs and no lower extremity edema ABDOMEN:abdomen soft, non-tender and normal bowel sounds; abdominal mid-line incision well healed; TTP in the epigastric area.  Musculoskeletal:no cyanosis of digits and no clubbing, (+) tenderness and limited ROM of left ankle, no edema or warmness.   NEURO: alert & oriented x 3 with fluent speech, no focal motor/sensory deficits   IMPRESSION: Leslie Mcmillan is a 43 y.o. male with a history of LOW GRADE Leslie s/p Mcmillan on 08/12/2013.     PLAN:  1.  Leslie of the Ileum s/p Mcmillan, Stage III (pT3,pN1) --Patient is doing well. Chromogranin last week was normal and 24 hour urine 5-HIAA normal 3 month ago. CBC/CMET are normal. MRI in 11/2013 for restaging is completely normal with NED (No evidence of disease) -- I will see him again 3 months and repeat his labs and markers for Leslie.    2. Acute left ankle pain -?gout or ankle injury? -I suggest him to follow up with his PCP or goes to urgent care   3. Chronic Headaches, Insomnia, fatigue -- follow up with his neurologist   All questions were answered. The patient knows to call the clinic with any problems, questions or concerns. We can certainly see the patient much sooner if necessary.  I spent 20 minutes counseling the patient face to face. The total time spent in the appointment was 25 minutes.  Plan 1. RTC in 3 month with lab (blood and urine) one week befrore -follow up with your PCP or urgent care for your left foot pain  Truitt Merle  03/04/2014

## 2014-03-24 ENCOUNTER — Ambulatory Visit: Payer: 59 | Admitting: Family Medicine

## 2014-03-26 ENCOUNTER — Ambulatory Visit (INDEPENDENT_AMBULATORY_CARE_PROVIDER_SITE_OTHER): Payer: 59 | Admitting: Family Medicine

## 2014-03-26 ENCOUNTER — Encounter: Payer: Self-pay | Admitting: Family Medicine

## 2014-03-26 VITALS — BP 128/86 | HR 78 | Temp 98.2°F | Resp 16 | Wt 219.5 lb

## 2014-03-26 DIAGNOSIS — I10 Essential (primary) hypertension: Secondary | ICD-10-CM

## 2014-03-26 DIAGNOSIS — G4700 Insomnia, unspecified: Secondary | ICD-10-CM

## 2014-03-26 DIAGNOSIS — R197 Diarrhea, unspecified: Secondary | ICD-10-CM

## 2014-03-26 LAB — CBC WITH DIFFERENTIAL/PLATELET
Basophils Absolute: 0 10*3/uL (ref 0.0–0.1)
Basophils Relative: 0.6 % (ref 0.0–3.0)
Eosinophils Absolute: 0.3 10*3/uL (ref 0.0–0.7)
Eosinophils Relative: 4.4 % (ref 0.0–5.0)
HCT: 39.7 % (ref 39.0–52.0)
Hemoglobin: 13.3 g/dL (ref 13.0–17.0)
LYMPHS PCT: 37.1 % (ref 12.0–46.0)
Lymphs Abs: 2.3 10*3/uL (ref 0.7–4.0)
MCHC: 33.6 g/dL (ref 30.0–36.0)
MCV: 82.8 fl (ref 78.0–100.0)
MONO ABS: 0.6 10*3/uL (ref 0.1–1.0)
Monocytes Relative: 9 % (ref 3.0–12.0)
NEUTROS PCT: 48.9 % (ref 43.0–77.0)
Neutro Abs: 3 10*3/uL (ref 1.4–7.7)
Platelets: 252 10*3/uL (ref 150.0–400.0)
RBC: 4.8 Mil/uL (ref 4.22–5.81)
RDW: 14.5 % (ref 11.5–15.5)
WBC: 6.2 10*3/uL (ref 4.0–10.5)

## 2014-03-26 LAB — BASIC METABOLIC PANEL
BUN: 9 mg/dL (ref 6–23)
CHLORIDE: 107 meq/L (ref 96–112)
CO2: 24 mEq/L (ref 19–32)
CREATININE: 1.31 mg/dL (ref 0.40–1.50)
Calcium: 9.3 mg/dL (ref 8.4–10.5)
GFR: 76.86 mL/min (ref >60.00)
GLUCOSE: 133 mg/dL — AB (ref 70–99)
Potassium: 3.6 mEq/L (ref 3.5–5.1)
Sodium: 139 mEq/L (ref 135–145)

## 2014-03-26 LAB — TSH: TSH: 0.42 u[IU]/mL (ref 0.35–4.50)

## 2014-03-26 MED ORDER — TRAZODONE HCL 50 MG PO TABS: 25.0000 mg | ORAL_TABLET | Freq: Every evening | ORAL | Status: DC | PRN

## 2014-03-26 NOTE — Assessment & Plan Note (Signed)
Chronic problem.  Pt is not currently on meds but not resting well and has job that requires him to be alert to prevent injury.  Start low dose trazodone and monitor for improvement.  Pt expressed understanding and is in agreement w/ plan.

## 2014-03-26 NOTE — Assessment & Plan Note (Signed)
New.  This could be viral but given pt's hx of carcinoid this is concerning.  Will check labs to r/o infxn, C diff, thyroid issues, electrolyte imbalance.  Pt to start immodium.  If no improvement by Monday, pt to notify Onc.  Pt expressed understanding and is in agreement w/ plan.

## 2014-03-26 NOTE — Progress Notes (Signed)
   Subjective:    Patient ID: Leslie Mcmillan, male    DOB: Feb 07, 1971, 44 y.o.   MRN: [id number]  HPI HTN- chronic problem, currently on Losartan.  No CP, SOB, HAs are improving, visual changes, edema.  Insomnia- ongoing problem, since starting Zoloft is able to fall asleep but unable to stay asleep.  Pt has been struggling w/ this for awhile.  Works as a Engineer, manufacturing systems.  Diarrhea- sxs started 2 weeks ago.  Occuring after eating.  Stools are watery.  No blood or mucous.  Rare intermittent abdominal pain.  Pt w/ hx of Carcinoid tumor.  No new meds recently.  Review of Systems For ROS see HPI     Objective:   Physical Exam  Constitutional: He is oriented to person, place, and time. He appears well-developed and well-nourished. No distress.  HENT:  Head: Normocephalic and atraumatic.  Eyes: Conjunctivae and EOM are normal. Pupils are equal, round, and reactive to light.  Neck: Normal range of motion. Neck supple. No thyromegaly present.  Cardiovascular: Normal rate, regular rhythm, normal heart sounds and intact distal pulses.   No murmur heard. Pulmonary/Chest: Effort normal and breath sounds normal. No respiratory distress.  Abdominal: Soft. Bowel sounds are normal. He exhibits no distension.  Musculoskeletal: He exhibits no edema.  Lymphadenopathy:    He has no cervical adenopathy.  Neurological: He is alert and oriented to person, place, and time. No cranial nerve deficit.  Skin: Skin is warm and dry.  Psychiatric: He has a normal mood and affect. His behavior is normal.  Vitals reviewed.         Assessment & Plan:

## 2014-03-26 NOTE — Assessment & Plan Note (Signed)
Chronic problem.  Well controlled today.  Asymptomatic.  No med changes at this time. 

## 2014-03-26 NOTE — Progress Notes (Signed)
Pre visit review using our clinic review tool, if applicable. No additional management support is needed unless otherwise documented below in the visit note. 

## 2014-03-26 NOTE — Patient Instructions (Signed)
Schedule your complete physical in 3-4 months No changes to the BP meds- they're working! Start the Trazodone as needed for sleep.  Start w/ 1/2 tab and increase only if needed Call with any questions or concerns Stay safe tomorrow!!

## 2014-03-27 ENCOUNTER — Telehealth: Payer: Self-pay | Admitting: Hematology

## 2014-03-27 NOTE — Telephone Encounter (Signed)
Left message to confirm appointment rescheduled from 3/23 to 3/22. Mailed calendar.

## 2014-03-30 LAB — HEPATIC FUNCTION PANEL
ALBUMIN: 3.9 g/dL (ref 3.5–5.2)
ALT: 17 U/L (ref 0–53)
AST: 22 U/L (ref 0–37)
Alkaline Phosphatase: 70 U/L (ref 39–117)
BILIRUBIN DIRECT: 0.1 mg/dL (ref 0.0–0.3)
Total Bilirubin: 0.4 mg/dL (ref 0.2–1.2)
Total Protein: 6.9 g/dL (ref 6.0–8.3)

## 2014-04-03 ENCOUNTER — Other Ambulatory Visit: Payer: Self-pay | Admitting: *Deleted

## 2014-04-03 ENCOUNTER — Telehealth: Payer: Self-pay | Admitting: *Deleted

## 2014-04-03 ENCOUNTER — Telehealth: Payer: Self-pay | Admitting: Hematology

## 2014-04-03 MED ORDER — DIPHENOXYLATE-ATROPINE 2.5-0.025 MG PO TABS: 1.0000 | ORAL_TABLET | Freq: Four times a day (QID) | ORAL | Status: DC | PRN

## 2014-04-03 NOTE — Telephone Encounter (Signed)
Received call from pt stating that he has had loose stools for 3 wks.  He reports that stools are either watery or soft & has 4-5 stools/day.  He doesn't see any blood in the toilet but when he wipes there is bright red blood on the tissue which he says may be from rubbing so much.  He feels like he is losing control sometimes.  He reports taking imodium but it really doesn't help.  He states that nothing helps.  He states that the imodium may make his stools soft but that is all.  He states his bowels moved 4 times yest in 2 hours. 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 Encouraged to push fluids & clear liqs for a while & will talk with Dr Burr Medico. Note to Dr Burr Medico

## 2014-04-03 NOTE — Telephone Encounter (Signed)
Notified pt that lomotil script would be sent in & Dr Burr Medico would like to move his appt to sooner.  POF to scheduler.

## 2014-04-03 NOTE — Telephone Encounter (Signed)
Left message to confirm appointment moved to February

## 2014-04-07 ENCOUNTER — Other Ambulatory Visit: Payer: Self-pay | Admitting: Family Medicine

## 2014-04-08 LAB — C. DIFFICILE GDH AND TOXIN A/B
C. DIFFICILE GDH: NOT DETECTED
C. difficile Toxin A/B: NOT DETECTED

## 2014-04-14 ENCOUNTER — Telehealth: Payer: Self-pay | Admitting: Hematology

## 2014-04-14 ENCOUNTER — Encounter: Payer: Self-pay | Admitting: Neurology

## 2014-04-14 ENCOUNTER — Other Ambulatory Visit (HOSPITAL_BASED_OUTPATIENT_CLINIC_OR_DEPARTMENT_OTHER): Payer: 59

## 2014-04-14 ENCOUNTER — Ambulatory Visit (HOSPITAL_BASED_OUTPATIENT_CLINIC_OR_DEPARTMENT_OTHER): Payer: 59 | Admitting: Hematology

## 2014-04-14 ENCOUNTER — Encounter: Payer: Self-pay | Admitting: Hematology

## 2014-04-14 ENCOUNTER — Ambulatory Visit (INDEPENDENT_AMBULATORY_CARE_PROVIDER_SITE_OTHER): Payer: 59 | Admitting: Neurology

## 2014-04-14 VITALS — BP 132/66 | HR 78 | Temp 98.2°F | Resp 20 | Ht 69.0 in | Wt 218.9 lb

## 2014-04-14 VITALS — BP 129/75 | HR 70 | Temp 98.5°F | Resp 19 | Ht 69.0 in | Wt 216.1 lb

## 2014-04-14 DIAGNOSIS — G4700 Insomnia, unspecified: Secondary | ICD-10-CM

## 2014-04-14 DIAGNOSIS — D3A012 Benign carcinoid tumor of the ileum: Secondary | ICD-10-CM

## 2014-04-14 DIAGNOSIS — R200 Anesthesia of skin: Secondary | ICD-10-CM

## 2014-04-14 DIAGNOSIS — C7A012 Malignant carcinoid tumor of the ileum: Secondary | ICD-10-CM

## 2014-04-14 DIAGNOSIS — G4441 Drug-induced headache, not elsewhere classified, intractable: Secondary | ICD-10-CM

## 2014-04-14 DIAGNOSIS — R5383 Other fatigue: Secondary | ICD-10-CM

## 2014-04-14 DIAGNOSIS — G4440 Drug-induced headache, not elsewhere classified, not intractable: Secondary | ICD-10-CM

## 2014-04-14 DIAGNOSIS — R197 Diarrhea, unspecified: Secondary | ICD-10-CM

## 2014-04-14 DIAGNOSIS — G43709 Chronic migraine without aura, not intractable, without status migrainosus: Secondary | ICD-10-CM

## 2014-04-14 DIAGNOSIS — R208 Other disturbances of skin sensation: Secondary | ICD-10-CM

## 2014-04-14 DIAGNOSIS — R51 Headache: Secondary | ICD-10-CM

## 2014-04-14 LAB — CBC WITH DIFFERENTIAL/PLATELET
BASO%: 0.2 % (ref 0.0–2.0)
Basophils Absolute: 0 10*3/uL (ref 0.0–0.1)
EOS ABS: 0.1 10*3/uL (ref 0.0–0.5)
EOS%: 1.8 % (ref 0.0–7.0)
HCT: 39.3 % (ref 38.4–49.9)
HGB: 13 g/dL (ref 13.0–17.1)
LYMPH%: 36.7 % (ref 14.0–49.0)
MCH: 28 pg (ref 27.2–33.4)
MCHC: 33.1 g/dL (ref 32.0–36.0)
MCV: 84.7 fL (ref 79.3–98.0)
MONO#: 0.5 10*3/uL (ref 0.1–0.9)
MONO%: 8 % (ref 0.0–14.0)
NEUT%: 53.3 % (ref 39.0–75.0)
NEUTROS ABS: 3 10*3/uL (ref 1.5–6.5)
Platelets: 258 10*3/uL (ref 140–400)
RBC: 4.64 10*6/uL (ref 4.20–5.82)
RDW: 13.9 % (ref 11.0–14.6)
WBC: 5.7 10*3/uL (ref 4.0–10.3)
lymph#: 2.1 10*3/uL (ref 0.9–3.3)

## 2014-04-14 LAB — COMPREHENSIVE METABOLIC PANEL (CC13)
ALT: 19 U/L (ref 0–55)
AST: 19 U/L (ref 5–34)
Albumin: 3.7 g/dL (ref 3.5–5.0)
Alkaline Phosphatase: 75 U/L (ref 40–150)
Anion Gap: 9 mEq/L (ref 3–11)
BILIRUBIN TOTAL: 0.43 mg/dL (ref 0.20–1.20)
BUN: 9.5 mg/dL (ref 7.0–26.0)
CALCIUM: 8.9 mg/dL (ref 8.4–10.4)
CO2: 26 mEq/L (ref 22–29)
CREATININE: 1.3 mg/dL (ref 0.7–1.3)
Chloride: 107 mEq/L (ref 98–109)
EGFR: 78 mL/min/{1.73_m2} — AB (ref >90)
GLUCOSE: 96 mg/dL (ref 70–140)
Potassium: 4.3 mEq/L (ref 3.5–5.1)
Sodium: 142 mEq/L (ref 136–145)
Total Protein: 6.7 g/dL (ref 6.4–8.3)

## 2014-04-14 MED ORDER — DIPHENOXYLATE-ATROPINE 2.5-0.025 MG PO TABS: 1.0000 | ORAL_TABLET | Freq: Four times a day (QID) | ORAL | Status: DC | PRN

## 2014-04-14 MED ORDER — SERTRALINE HCL 50 MG PO TABS: 100.0000 mg | ORAL_TABLET | Freq: Every day | ORAL | Status: DC

## 2014-04-14 NOTE — Progress Notes (Signed)
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  MRI/MRA reviewed.  UPDATE: MRI and MRA of head was performed on 02/26/14, which showed mild punctate T2 and FLAIR hyperintensities in the periventricular and subcortical white matter, consistent with chronic small vessel ischemic changes.  No aneurysm seen.  He reports improvement in headaches.  He increased the Zoloft to 100mg  Intensity:  6/10 Duration:  15-20 minutes with sumatriptan.   Frequency:  8-10 days per month.  Also has daily dull headache Current abortive therapy:  Sumatriptan 100mg , Tylenol (takes 5 days per week) Current preventative therapy:  Zoloft 50mg  Other medications:  Cozaar  For hand numbness, he was advised to try wrist splints at night.  It was ineffective   HISTORY: Onset:  Mid-30s Location:  Right frontal Quality:  Dull and gradually progresses to throbbing Initial Intensity:  2/10 dull, 9/10 severe Aura:  no Prodrome:  no Associated symptoms:  Photophobia, phonophobia, blurred vision.  Occasional nausea.  No vomiting. Initial Duration:  2-4 hours Initial Frequency:  Daily (severe headaches occur 15 days/month) Triggers/exacerbating factors:  nothing Relieving factors:  rest Activity:  Able to force self to work.  He works as a Actor  Past abortive therapy:  Aleve (dulls pain), Tylenol (ineffective), Vicodin (dulls pain), BC powder, Goodys Past preventative therapy:  none  He also reports bilateral hand numbness.  It occurs in bed and while driving.  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  He has to shake out his hands.  No radicular pain from the neck.  PAST MEDICAL HISTORY: Past Medical History  Diagnosis Date    . Anxiety   . Diverticulitis   . Gastroduodenitis   . Hypertension     bp runs high sometimes, no bp meds  . Depression   . GERD (gastroesophageal reflux disease)     no meds for  . Blood in stool 5--31-2015  . Headache     MEDICATIONS: Current Outpatient Prescriptions on File Prior to Visit  Medication Sig Dispense Refill  . diphenoxylate-atropine (LOMOTIL) 2.5-0.025 MG per tablet Take 1 tablet by mouth 4 (four) times daily as needed for diarrhea or loose stools. May take every 6 hours prn diarrhea-Max of 8 tab/day 40 tablet 2  . fluticasone (FLONASE) 50 MCG/ACT nasal spray Place 2 sprays into both nostrils daily. 16 g 6  . losartan (COZAAR) 50 MG tablet Take 1 tablet (50 mg total) by mouth daily. 90 tablet 3  . naproxen (NAPROSYN) 500 MG tablet Take 1 tablet (500 mg total) by mouth 2 (two) times daily with a meal. 40 tablet 1  . SUMAtriptan (IMITREX) 100 MG tablet Take 1 tablet (100 mg total) by mouth once as needed for migraine or headache. May repeat in 2 hours if headache persists or recurs. 9 tablet 2  . traZODone (DESYREL) 50 MG tablet Take 0.5-1 tablets (25-50 mg total) by mouth at bedtime as needed for sleep. 30 tablet 3   No current facility-administered medications on file prior to visit.    ALLERGIES: No Known Allergies  FAMILY HISTORY: Family History  Problem Relation Age of Onset  . Diabetes Paternal Grandmother   . Hypertension Paternal Grandmother   . Diabetes Paternal Uncle   . Diabetes Paternal Aunt   .  Liver disease Paternal Uncle   . Hypertension Paternal Aunt   . Anuerysm Mother     brain    SOCIAL HISTORY: History   Social History  . Marital Status: Single    Spouse Name: N/A    Number of Children: 6  . 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 Smoking status: Current Every Day Smoker -- 0.50 packs/day for 10 years    Types: Cigarettes  . Smokeless tobacco: Never Used   . Alcohol Use: 0.0 oz/week    0 Not specified per week     Comment: 2-3 beers some days  . Drug Use: Yes    Special: Marijuana  . Sexual Activity:    Partners: Female   Other Topics Concern  . Not on file   Social History Narrative    REVIEW OF SYSTEMS: Constitutional: No fevers, chills, or sweats, no generalized fatigue, change in appetite Eyes: No visual changes, double vision, eye pain Ear, nose and throat: No hearing loss, ear pain, nasal congestion, sore throat Cardiovascular: No chest pain, palpitations Respiratory:  No shortness of breath at rest or with exertion, wheezes GastrointestinaI: No nausea, vomiting, diarrhea, abdominal pain, fecal incontinence Genitourinary:  No dysuria, urinary retention or frequency Musculoskeletal:  No neck pain, back pain Integumentary: No rash, pruritus, skin lesions Neurological: as above Psychiatric: No depression, insomnia, anxiety Endocrine: No palpitations, fatigue, diaphoresis, mood swings, change in appetite, change in weight, increased thirst Hematologic/Lymphatic:  No anemia, purpura, petechiae. Allergic/Immunologic: no itchy/runny eyes, nasal congestion, recent allergic reactions, rashes  PHYSICAL EXAM: Filed Vitals:   04/14/14 1359  BP: 132/66  Pulse: 78  Temp: 98.2 F (36.8 C)  Resp: 20   General: No acute distress Head:  Normocephalic/atraumatic Eyes:  Fundoscopic exam unremarkable without vessel changes, exudates, hemorrhages or papilledema. Neck: supple, no paraspinal tenderness, full range of motion Heart:  Regular rate and rhythm Lungs:  Clear to auscultation bilaterally Back: No paraspinal tenderness Neurological Exam: alert and oriented to person, place, and time. Attention span and concentration intact, recent and remote memory intact, fund of knowledge intact.  Speech fluent and not dysarthric, language intact.  CN II-XII intact. Fundoscopic exam unremarkable without vessel changes, exudates, hemorrhages or  papilledema.  Bulk and tone normal, muscle strength 5/5 throughout.  Sensation to light touch, temperature and vibration intact.  Deep tendon reflexes 2+ throughout, toes downgoing.  Finger to nose and heel to shin testing intact.  Gait normal  IMPRESSION: Migraine without aura Medication overuse headache Bilateral hand numbness, suspect carpal tunnel  PLAN: 1.  Continue Zoloft 100mg  2.  Limit use of pain relievers to no more than 2 days out of the week 3.  Sumatriptan and tylenol for abortive therapy 4.  NCV-EMG of upper extremities 5.  Follow up in 3 months. 6.  Smoking cessation  15 minutes spent with patient, over 50% spent discussing management.  Metta Clines, DO  CC:  Annye Asa, MD

## 2014-04-14 NOTE — Patient Instructions (Signed)
1.  Continue sertraline 50mg , two pills (100mg ) daily 2.  Continue sumatriptan and Tylenol. Limit use of ALL pain relievers to no more than 2 days out of the week.   3.  Will schedule for nerve conduction study 4.  Follow up in 3 months

## 2014-04-14 NOTE — Telephone Encounter (Signed)
gv and printed appt sched and avs for pt for March....gv pt barium °

## 2014-04-14 NOTE — Progress Notes (Signed)
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 Johney Maine.     He reports that in winter of 2012, he abdominal pains described as tense muscle spasms, sharp pains that were severe in quality.  He went to the emergency room at [facility] and he reports that he was told that he inflammation and provided antibiotics and ibuprofen. CT of abdomen on 10/25/2010 showed that there was mild inflammation surrounding the proximal jejunum near the ligament of Treitz, findings which could represent focal enteritis. He had later complained of some severe headaches causing an emergency room visit [facility] and he had a CT of head in January 2015 which showed no acute intracranial abnormality.   He notes that there was slow progression of his abdominal pain over time.  Eventually, due to it recurrence and progression, he presented to [facility] ER and CT of abdomen and pelvis was unrevealing on 05/05/2013.  Due to progressive abdominal pain and rectal bleeding, he was referred to [facility] (Dr. Hilarie Fredrickson).  He had a Meckels scan on 05/22/2013  and MRI (05/30/2013) and capsule endoscopy.  Meckel's scan was without scintigraphic evidence of ectopic gastric mucosa and demonstrated that there was a subtle round soft tissue mass within the right lower quadrant which was difficult to distinguish from the adjacent loops of small bowel. This small mass measured 2.1 x 2.4 cm on 10/25/2010 increased slowly to 2.8 x 2.7 cm on CT of 05/05/2013. Common differential for this small slow growing lesion would be a  carcinoid tumor or a gastrointestinal stromal tumor. No retractile pattern typical of carcinoid.    MRI of abdomen and pelvis on 05/30/2013 showed a diffusely enhacing 3.0 x 2.0 x 2.2 cm mass in the  new mesentery with close associated mesenteric vessels but the lesion did not appear to invade the small bowel.  As noted on the Mecke's can, top differential diagnostic consideration would be GIST or carcinoid tumor.  Lymphoma was not completely excluded. Of note, he also had several jejunal loops that were somewhat featureless which could be due to fold thickening or effacement.   Based on results of these studies, he was referred to to Surgery (Dr. Johney Maine).  He was admitted for mesenteric mass and present carcinoid and underwent laparoscopic assisted resection of ileum and ymph nodes on 08/12/2013.  There were no complications.  His pathology noted carcinoid with 3/6 lymph nodes positive.  Medical oncology consultation was obtained and patient seen by Dr Juliann Mule on 09/04/2013.      INTERVAL HISTORY: He returns for follow up. He has been having diarrhea for the past 3 weeks, he initially had loose BM 4-5 times daily, no nausea or abdominal pain, he tried imodium which did not help much, and I gave him lomidil and he has 2-3 soft BM lately. No fever or chills, stool C-DIFF was negative. He has mild hot flush and night sweats sometimes, no chest pain or dyspnia.    PAST MEDICAL HISTORY:  Past Medical History  Diagnosis Date  . Anxiety   . Diverticulitis   . Gastroduodenitis   .  Hypertension     bp runs high sometimes, no bp meds  . Depression   . GERD (gastroesophageal reflux disease)     no meds for  . Blood in stool 5--31-2015  . Headache      PAST SURGICAL HISTORY: Past Surgical History  Procedure Laterality Date  . Colonoscopy N/A 05/09/2013    Procedure: COLONOSCOPY;  Surgeon: Irene Shipper, MD;  Location: [facility];  Service: Endoscopy;  Laterality: N/A;  . Esophagogastroduodenoscopy N/A  05/09/2013    Procedure: ESOPHAGOGASTRODUODENOSCOPY (EGD);  Surgeon: Irene Shipper, MD;  Location: [facility];  Service: Endoscopy;  Laterality: N/A;  possible egd depending on colon results  . Tonsillectomy  as child  . Laparoscopic appendectomy N/A 08/12/2013    Procedure: LAPAROSCOPIC ASSISTED APPENDECTOMY;  Surgeon: Adin Hector, MD;  Location: [facility];  Service: General;  Laterality: N/A;  . Bowel resection N/A 08/12/2013    Procedure: SMALL BOWEL ILEAL RESECTION;  Surgeon: Adin Hector, MD;  Location: [facility];  Service: General;  Laterality: N/A;     CURRENT MEDICATIONS: has a current medication list which includes the following prescription(s): diphenoxylate-atropine, fluticasone, hydrocodone-acetaminophen, losartan, naproxen, oxycodone, sertraline, sumatriptan, and trazodone.   ALLERGIES: Review of patient's allergies indicates no known allergies.   SOCIAL HISTORY:  reports that he has been smoking Cigarettes.  He has a 5 pack-year smoking history. He has never used smokeless tobacco. He reports that he drinks alcohol. He reports that he uses illicit drugs (Marijuana).   FAMILY HISTORY: family history includes Anuerysm in his mother; Diabetes in his paternal aunt, paternal grandmother, and paternal uncle; Hypertension in his paternal aunt and paternal grandmother; Liver disease in his paternal uncle.   LABORATORY DATA:  CBC Latest Ref Rng 04/14/2014 03/26/2014 02/23/2014  WBC 4.0 - 10.3 10e3/uL 5.7 6.2 5.8  Hemoglobin 13.0 - 17.1 g/dL 13.0 13.3 14.0  Hematocrit 38.4 - 49.9 % 39.3 39.7 41.5  Platelets 140 - 400 10e3/uL 258 252.0 268    CMP Latest Ref Rng 03/26/2014 02/23/2014 12/22/2013  Glucose 70 - 99 mg/dL 133(H) 95 180(H)  BUN 6 - 23 mg/dL 9 9.8 9  Creatinine 0.40 - 1.50 mg/dL 1.31 1.3 1.4  Sodium 135 - 145 mEq/L 139 141 140  Potassium 3.5 - 5.1 mEq/L 3.6 4.3 3.9  Chloride 96 - 112 mEq/L 107 - 105  CO2 19 - 32 mEq/L _0 Calcium 8.4 - 10.5 mg/dL 9.3 9.4 9.0  Total  Protein 6.0 - 8.3 g/dL 6.9 7.0 -  Total Bilirubin 0.2 - 1.2 mg/dL 0.4 0.69 -  Alkaline Phos 39 - 117 U/L 70 75 -  AST 0 - 37 U/L 22 23 -  ALT 0 - 53 U/L 17 31 -    RADIOGRAPHY:  MR ABDOMEN W WO CONTRAST 11/06/2013  CLINICAL DATA: Carcinoid tumor of ileum. Surgery 6/15. Intermittent abdominal pain. EXAM: MRI ABDOMEN WITHOUT AND WITH CONTRAST  TECHNIQUE: Multiplanar multisequence MR imaging of the abdomen was performed both before and after the administration of intravenous contrast.  CONTRAST: 77m MULTIHANCE GADOBENATE DIMEGLUMINE 529 MG/ML IV SOLN 18 cc MultiHance  COMPARISON: 05/30/2013 and CT of 05/05/2013.  FINDINGS: Normal heart size without pericardial or pleural effusion. A tiny cyst in the right lobe of the liver on image 28 of series 8. No suspicious liver lesions and no abnormal foci of arterial hyper enhancement. Normal spleen, stomach, pancreas, gallbladder, biliary tract, adrenal glands, kidneys. No abdominal adenopathy or ascites. Normal caliber of abdominal bowel loops. Small  jejunal mesenteric nodes are likely reactive. No evidence omental/peritoneal disease. IMPRESSION: No acute process or evidence of metastatic disease within the abdomen. Electronically Signed  By: Abigail Miyamoto M.D. On: 11/06/2013 11:31   PATHOLOGY:  Diagnosis 1. Appendix, Other than Incidental - BENIGN APPENDICEAL TISSUE WITH REACTIVE LYMPHOID HYPERPLASIA AND FIBROUS OBLITERANS. - NO EVIDENCE OF CARCINOID TUMOR. - NO EVIDENCE OF ACTIVE INFLAMMATION OR NEOPLASIA. 2. Small intestine, resection for tumor, ileal with ileal mass, mesenteric mass - WELL DIFFERENTIATED NEUROENDOCRINE TUMOR (CARCINOID TUMOR), INVADING THROUGH THE MUSCULARIS PROPRIA INTO SUBSEROSAL TISSUE. - ANGIOLYMPHATIC INVASION PRESENT. - THREE OF SIX LYMPH NODES, POSITIVE FOR CARCINOID TUMOR (3/6). - RESECTION MARGINS, NEGATIVE FOR ATYPIA OR MALIGNANCY. Microscopic Comment 2. SMALL INTESTINE - NEUROENDOCRINE: Specimen: Small bowel -  ileum Procedure: Segmental resection Tumor Site: Ileum Tumor Size: 1.5 cm, gross measurement Tumor Focality: Unifocal Histologic Type: Well differentiated (low grade) neuroendocrine tumor (carcinoid tumor). Histologic Grade : G1, Low grade Mitotic Rate < 2/10 high-power fields (HIGH POWER FIELD), Ki-67: <3% Microscopic Tumor Extension: Invading through the muscularis propria into subserosal tissue. Margins: Negative Proximal Margin: 8 cm Distal Margin: 19.5 cm Mesenteric (Radial) Margin : 10 cm from the mucosa lesion and 0.1 cm from the positive mesenteric lymph node, please see comment for details. Distance to the closest margin: please see comment for details.Lymph-Vascular Invasion: Present Perineural Invasion: Not identified Lymph nodes: number examined 6; number positive: 3. TNM Staging: pT3, pN1 1 of 3 FINAL for ADON, GEHLHAUSEN ([id number]) Microscopic Comment(continued) Additional findings: N/A Comments: The mucosal mass in the ileum measures 1.5 cm. The tumor cells invade through the muscularis propria into the subserosal tissue without penetration of the inked serosa.Immunohistochemical stains were performed and Ki-67 rate is less than 3%. 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 The mucosa tumor is 10 cm from the mesenteric resection margin. The tumor in the partially replaced lymph node is 0.1 cm from the inked mesenteric margin. The case was discussed with Dr. Neysa Bonito on 08/13/2013. (HCL:kh 08/13/13) H. CATHERINE LI MDPathologist, Electronic Signature(Case signed 08/14/2013)   REVIEW OF SYSTEMS:  Constitutional: Denies fevers, chills or abnormal weight loss Eyes: Denies blurriness of vision Ears, nose, mouth, throat, and face: Denies mucositis or sore throat Respiratory: Denies cough, dyspnea or wheezes Cardiovascular: Denies palpitation, chest discomfort or lower extremity  swelling Gastrointestinal:  Denies nausea, heartburn; diarrhea as noted in HPI.  Skin: Denies abnormal skin rashes Lymphatics: Denies new lymphadenopathy or easy bruising Neurological:Denies numbness, tingling or new weaknesses Behavioral/Psych: Mood is stable, no new changes  All other systems were reviewed with the patient and are negative.  PHYSICAL EXAM:  height is _0  (1.753 m) and weight is 216 lb 1.6 oz (98.022 kg). His oral temperature is 98.5 F (36.9 C). His blood pressure is 129/75 and his pulse is 70. His respiration is 19 and oxygen saturation is 99%.    GENERAL:alert, no distress and comfortable; anxious, well developed and well nourished.  SKIN: skin color, texture, turgor are normal, no rashes or significant lesions EYES: normal, Conjunctiva are pink and non-injected, sclera clear OROPHARYNX:no exudate, no erythema and lips, buccal mucosa, and tongue normal  NECK: supple, thyroid normal size, non-tender, without nodularity LYMPH:  no palpable lymphadenopathy in the cervical, axillary or inguinal LUNGS: clear to auscultation and percussion with normal breathing effort HEART: regular rate & rhythm and no murmurs and no lower extremity edema ABDOMEN:abdomen soft, mild tenderness at mid and left upper  quadrant, normal bowel sounds; abdominal mid-line incision well healed; Musculoskeletal:no cyanosis of digits and no clubbing, (+) tenderness and limited ROM of left ankle, no edema or warmness.   NEURO: alert & oriented x 3 with fluent speech, no focal motor/sensory deficits   IMPRESSION: Leslie Mcmillan is a 44 y.o. male with a history of LOW GRADE CARCINOID s/p resection on 08/12/2013.    PLAN:  1.  Carcinoid of the Ileum s/p resection, Stage III (pT3,pN1) -- he has developed chronic diarrhea , mild hot flash in the past 3 weeks,  Stool C. Difficile was negative. I am little concerned this could be carcinoid syndrome symptoms,  Although this also could be related to his   surgery. He is chromogranin and urine 5 HIAA levels are still pending today.  - I would like to obtain a CT of chest, abdomen and pelvis with IV contrast to ruled out  His carcinoid  tumor recurrence - if he is endocrine markers or CT scan is suspicious for recurrence, I would consider an octreotide scan  2.  Chronic diarrhea - secondary to surgery,  Versus carcinoid tumor recurrence. - CT scan. - if  Above workup is negative for carcinoid tumor recurrence , and his diarrhea persists, our refer him back to GI to consider endoscopy. - continue Imodium and Lomotil as needed for diarrhea  3. Chronic Headaches, Insomnia, fatigue -- follow up with his neurologist   All questions were answered. The patient knows to call the clinic with any problems, questions or concerns. We can certainly see the patient much sooner if necessary.  I spent 20 minutes counseling the patient face to face. The total time spent in the appointment was 25 minutes.  Plan 1. RTC in 3 weeks  -CT scan before next visit    Truitt Merle  04/14/2014

## 2014-04-17 LAB — CHROMOGRANIN A: Chromogranin A: 13 ng/mL (ref <=15)

## 2014-04-19 LAB — 5 HIAA, QUANTITATIVE, URINE, 24 HOUR
5-HIAA, 24 Hr Urine: 1.8 mg/24 h (ref <=6.0)
Volume, Urine-5HIAA: 500 mL/24 h

## 2014-04-22 ENCOUNTER — Other Ambulatory Visit: Payer: Self-pay | Admitting: Neurology

## 2014-04-24 ENCOUNTER — Ambulatory Visit (HOSPITAL_COMMUNITY)
Admission: RE | Admit: 2014-04-24 | Discharge: 2014-04-24 | Disposition: A | Discharge status: Home / Self Care | Payer: 59 | Source: Ambulatory Visit | Attending: Hematology | Admitting: Hematology

## 2014-04-24 ENCOUNTER — Encounter: Payer: Self-pay | Admitting: Nurse Practitioner

## 2014-04-24 ENCOUNTER — Encounter (HOSPITAL_COMMUNITY): Payer: Self-pay

## 2014-04-24 ENCOUNTER — Telehealth: Payer: Self-pay | Admitting: Nurse Practitioner

## 2014-04-24 DIAGNOSIS — D3A012 Benign carcinoid tumor of the ileum: Secondary | ICD-10-CM | POA: Diagnosis not present

## 2014-04-24 DIAGNOSIS — K529 Noninfective gastroenteritis and colitis, unspecified: Secondary | ICD-10-CM | POA: Diagnosis not present

## 2014-04-24 DIAGNOSIS — M546 Pain in thoracic spine: Secondary | ICD-10-CM

## 2014-04-24 MED ORDER — IOHEXOL 300 MG/ML  SOLN
100.0000 mL | Freq: Once | INTRAMUSCULAR | Status: AC | PRN
  Administered 2014-04-24: 100 mL via INTRAVENOUS

## 2014-04-24 MED ORDER — NAPROXEN 500 MG PO TABS: 500.0000 mg | ORAL_TABLET | Freq: Two times a day (BID) | ORAL | Status: DC

## 2014-04-24 MED ORDER — CYCLOBENZAPRINE HCL 10 MG PO TABS: 10.0000 mg | ORAL_TABLET | Freq: Three times a day (TID) | ORAL | Status: DC | PRN

## 2014-04-24 NOTE — Progress Notes (Signed)
We are sorry that you are not feeling well.  Here is how we plan to help!  Based on what you have shared with me it looks like you mostly have acute back pain.  Acute back pain is defined as musculoskeletal pain that can resolve in 1-3 weeks with conservative treatment.  I have prescribed Naprosyn 500 mg twice a day non-steroid anti-inflammatory (NSAID) as well as Flexeril 10 mg every eight hours as needed which is a muscle relaxer.  Some patients experience stomach irritation or in increased heartburn with anti-inflammatory drugs.  Please keep in mind that muscle relaxer's can cause fatigue and should not be taken while at work or driving.  Back pain is very common.  The pain often gets better over time.  The cause of back pain is usually not dangerous.  Most people can learn to manage their back pain on their own.  Home Care  Stay active.  Start with short walks on flat ground if you can.  Try to walk farther each day.  Do not sit, drive or stand in one place for more than 30 minutes.  Do not stay in bed.  Do not avoid exercise or work.  Activity can help your back heal faster.  Be careful when you bend or lift an object.  Bend at your knees, keep the object close to you, and do not twist.  Sleep on a firm mattress.  Lie on your side, and bend your knees.  If you lie on your back, put a pillow under your knees.  Only take medicines as told by your doctor.  Put ice on the injured area.  Put ice in a plastic bag  Place a towel between your skin and the bag  Leave the ice on for 15-20 minutes, 3-4 times a day for the first 2-3 days.  After that, you can switch between ice and heat packs.  Ask your doctor about back exercises or massage.  Avoid feeling anxious or stressed.  Find good ways to deal with stress, such as exercise.  Get Help Right Way If:  Your pain does not go away with rest or medicine.  Your pain does not go away in 1 week.  You have new problems.  You do not  feel well.  The pain spreads into your legs.  You cannot control when you poop (bowel movement) or pee (urinate)  You feel sick to your stomach (nauseous) or throw up (vomit)  You have belly (abdominal) pain.  You feel like you may pass out (faint).  If you develop a fever.  Make Sure you:  Understand these instructions.  Will watch your condition  Will get help right away if you are not doing well or get worse.  Your e-visit answers were reviewed by a board certified advanced clinical practitioner to complete your personal care plan.  Depending on the condition, your plan could have included both over the counter or prescription medications.  If there is a problem please reply  once you have received a response from your provider.  Your safety is important to [place].  If you have drug allergies check your prescription carefully.    You can use MyChart to ask questions about today's visit, request a non-urgent call back, or ask for a work or school excuse.  You will get an e-mail in the next two days asking about your experience.  I hope that your e-visit has been valuable and will speed your recovery. Thank you  for using e-visits.

## 2014-04-27 ENCOUNTER — Encounter: Payer: 59 | Admitting: Neurology

## 2014-04-29 ENCOUNTER — Encounter: Payer: 59 | Admitting: Neurology

## 2014-05-04 ENCOUNTER — Ambulatory Visit (INDEPENDENT_AMBULATORY_CARE_PROVIDER_SITE_OTHER): Payer: 59 | Admitting: Neurology

## 2014-05-04 DIAGNOSIS — G5603 Carpal tunnel syndrome, bilateral upper limbs: Secondary | ICD-10-CM

## 2014-05-04 DIAGNOSIS — G43709 Chronic migraine without aura, not intractable, without status migrainosus: Secondary | ICD-10-CM

## 2014-05-04 DIAGNOSIS — G4440 Drug-induced headache, not elsewhere classified, not intractable: Secondary | ICD-10-CM

## 2014-05-04 DIAGNOSIS — G5622 Lesion of ulnar nerve, left upper limb: Secondary | ICD-10-CM

## 2014-05-04 DIAGNOSIS — R200 Anesthesia of skin: Secondary | ICD-10-CM

## 2014-05-04 NOTE — Procedures (Signed)
[facility]  [place], [place]  [place], [place] [address] Tel: [phone] Fax:  [phone] Test Date:  05/04/2014  Patient: Leslie Mcmillan DOB: 08/26/1970 Physician: Narda Amber, DO  Sex: Male Height: 5\' 9"  Ref Phys: Metta Clines  ID#: [id number]   Technician: Laureen Ochs R. NCS T.   Patient Complaints: Patient is a 43 year old right handed male here for evaluation of bilateral hand symptoms of numbness and tingling left worse than right.  NCV & EMG Findings: Extensive electrodiagnostic testing of the right upper extremity and additional studies of the left shows:  1. Bilateral median sensory responses are prolonged and reduced in amplitude. Bilateral ulnar and radial sensory responses are within normal limits. 2. Left ulnar motor response shows slowing across the elbow with preserved amplitude and latency. Bilateral median and right ulnar motor response says are within normal limits. 3. Sparse chronic motor axon loss changes are seen affecting the left abductor pollicis brevis, without accompanied active denervation. The remaining muscles show normal appearing motor unit configuration and recruitment pattern.   Impression: 1. Bilateral median neuropathy, at or distal to the wrist, consistent with the clinical diagnosis of carpal tunnel syndrome. Overall, these findings are moderate in degree electrically. 2. Left ulnar neuropathy with slowing across the elbow, purely demyelinating in type. 3. There is no evidence of a superimposed cervical radiculopathy affecting the upper extremities.   ___________________________ Narda Amber, DO    Nerve Conduction Studies Anti Sensory Summary Table   Site NR Peak (ms) Norm Peak (ms) P-T Amp (V) Norm P-T Amp  Left Median Anti Sensory (2nd Digit)  36C    stimulated at 14cm  Wrist    4.3 <3.4 18.5 >20  Right Median Anti Sensory (2nd Digit)  37C    stimulated at 14cm  Wrist    4.5 <3.4 16.0 >20  Left Radial Anti  Sensory (Base 1st Digit)  36C  Wrist    2.0 <2.7 34.4 >18  Right Radial Anti Sensory (Base 1st Digit)  37C  Wrist    2.1 <2.7 37.8 >18  Left Ulnar Anti Sensory (5th Digit)  36C  Wrist    2.8 <3.1 18.1 >12  Right Ulnar Anti Sensory (5th Digit)  37C  Wrist    2.8 <3.1 20.1 >12   Motor Summary Table   Site NR Onset (ms) Norm Onset (ms) O-P Amp (mV) Norm O-P Amp Site1 Site2 Delta-0 (ms) Dist (cm) Vel (m/s) Norm Vel (m/s)  Left Median Motor (Abd Poll Brev)  36C  Wrist    3.8 <3.9 13.7 >6 Elbow Wrist 5.0 29.0 58 >50  Elbow    8.8  12.7         Right Median Motor (Abd Poll Brev)  37C  Wrist    3.9 <3.9 10.5 >6 Elbow Wrist 5.1 30.0 59 >50  Elbow    9.0  10.0         Left Ulnar Motor (Abd Dig Minimi)  36C  Wrist    2.3 <3.1 9.1 >7 B Elbow Wrist 3.6 24.0 67 >50  B Elbow    5.9  8.7  A Elbow B Elbow 2.1 10.0 48 >50  A Elbow    8.0  7.9         Right Ulnar Motor (Abd Dig Minimi)  37C  Wrist    2.3 <3.1 8.9 >7 B Elbow Wrist 4.0 23.5 59 >50  B Elbow    6.3  8.6  A Elbow B  Elbow 1.9 10.0 53 >50  A Elbow    8.2  8.6          F Wave Studies   NR F-Lat (ms) Lat Norm (ms) L-R F-Lat (ms)  Left Ulnar (Mrkrs) (Abd Dig Min)  36C     27.55 <33 0.00  Right Ulnar (Mrkrs) (Abd Dig Min)  37C     27.55 <33 0.00   EMG   Side Muscle Ins Act Fibs Psw Fasc Number Recrt Dur Dur. Amp Amp. Poly Poly. Comment  Right 1stDorInt Nml Nml Nml Nml Nml Nml Nml Nml Nml Nml Nml Nml N/A  Right Abd Poll Brev Nml Nml Nml Nml Nml Nml Nml Nml Nml Nml Nml Nml N/A  Right PronatorTeres Nml Nml Nml Nml Nml Nml Nml Nml Nml Nml Nml Nml N/A  Right Biceps Nml Nml Nml Nml Nml Nml Nml Nml Nml Nml Nml Nml N/A  Right Triceps Nml Nml Nml Nml Nml Nml Nml Nml Nml Nml Nml Nml N/A  Right Deltoid Nml Nml Nml Nml Nml Nml Nml Nml Nml Nml Nml Nml N/A  Left 1stDorInt Nml Nml Nml Nml Nml Nml Nml Nml Nml Nml Nml Nml N/A  Left Abd Poll Brev Nml Nml Nml Nml 1- Mod-R Some 1+ Some 1+ Nml Nml N/A  Left PronatorTeres Nml Nml Nml Nml Nml Nml  Nml Nml Nml Nml Nml Nml N/A  Left FlexDigProf 4&5 Nml Nml Nml Nml Nml Nml Nml Nml Nml Nml Nml Nml N/A  Left Triceps Nml Nml Nml Nml Nml Nml Nml Nml Nml Nml Nml Nml N/A  Left Deltoid Nml Nml Nml Nml Nml Nml Nml Nml Nml Nml Nml Nml N/A      Waveforms:

## 2014-05-05 ENCOUNTER — Ambulatory Visit (HOSPITAL_BASED_OUTPATIENT_CLINIC_OR_DEPARTMENT_OTHER): Payer: 59 | Admitting: Hematology

## 2014-05-05 ENCOUNTER — Encounter: Payer: Self-pay | Admitting: Hematology

## 2014-05-05 ENCOUNTER — Telehealth: Payer: Self-pay | Admitting: Hematology

## 2014-05-05 VITALS — BP 124/70 | HR 70 | Temp 98.0°F | Resp 18 | Ht 69.0 in | Wt 218.2 lb

## 2014-05-05 DIAGNOSIS — R51 Headache: Secondary | ICD-10-CM

## 2014-05-05 DIAGNOSIS — D3A012 Benign carcinoid tumor of the ileum: Secondary | ICD-10-CM

## 2014-05-05 DIAGNOSIS — R5383 Other fatigue: Secondary | ICD-10-CM

## 2014-05-05 DIAGNOSIS — G4700 Insomnia, unspecified: Secondary | ICD-10-CM

## 2014-05-05 DIAGNOSIS — R197 Diarrhea, unspecified: Secondary | ICD-10-CM

## 2014-05-05 DIAGNOSIS — C7A012 Malignant carcinoid tumor of the ileum: Secondary | ICD-10-CM

## 2014-05-05 MED ORDER — DIPHENOXYLATE-ATROPINE 2.5-0.025 MG PO TABS: 1.0000 | ORAL_TABLET | Freq: Four times a day (QID) | ORAL | Status: DC | PRN

## 2014-05-05 NOTE — Progress Notes (Signed)
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 Johney Maine.     He reports that in winter of 2012, he abdominal pains described as tense muscle spasms, sharp pains that were severe in quality.  He went to the emergency room at [facility] and he reports that he was told that he inflammation and provided antibiotics and ibuprofen. CT of abdomen on 10/25/2010 showed that there was mild inflammation surrounding the proximal jejunum near the ligament of Treitz, findings which could represent focal enteritis. He had later complained of some severe headaches causing an emergency room visit [facility] ED and he had a CT of head in January 2015 which showed no acute intracranial abnormality.   He notes that there was slow progression of his abdominal pain over time.  Eventually, due to it recurrence and progression, he presented to [facility] ER and CT of abdomen and pelvis was unrevealing on 05/05/2013.  Due to progressive abdominal pain and rectal bleeding, he was referred to [facility] (Dr. Hilarie Fredrickson).  He had a Meckels scan on 05/22/2013  and MRI (05/30/2013) and capsule endoscopy.  Meckel's scan was without scintigraphic evidence of ectopic gastric mucosa and demonstrated that there was a subtle round soft tissue mass within the right lower quadrant which was difficult to distinguish from the adjacent loops of small bowel. This small mass measured 2.1 x 2.4 cm on 10/25/2010 increased slowly to 2.8 x 2.7 cm on CT of 05/05/2013. Common differential for this small slow growing lesion would be a  carcinoid tumor or a gastrointestinal stromal tumor. No retractile pattern typical of carcinoid.    MRI of abdomen and pelvis on 05/30/2013 showed a diffusely enhacing 3.0 x 2.0 x 2.2 cm mass in the  new mesentery with close associated mesenteric vessels but the lesion did not appear to invade the small bowel.  As noted on the Mecke's can, top differential diagnostic consideration would be GIST or carcinoid tumor.  Lymphoma was not completely excluded. Of note, he also had several jejunal loops that were somewhat featureless which could be due to fold thickening or effacement.   Based on results of these studies, he was referred to to Surgery (Dr. Johney Maine).  He was admitted for mesenteric mass and present carcinoid and underwent laparoscopic assisted resection of ileum and ymph nodes on 08/12/2013.  There were no complications.  His pathology noted carcinoid with 3/6 lymph nodes positive.  Medical oncology consultation was obtained and patient seen by Dr Juliann Mule on 09/04/2013.      INTERVAL HISTORY: He returns for follow up. His diarrhea has improved, he still has loose bowel movement 2-3 times a day, he takes Imodium and Lomotil once or twice every few days. No significant abdominal cramps or pain, no nausea, no vomiting, he did have episode of blood on toilet tissue about 2 weeks ago, no other significant rectal bleeding or bloody stool. No fever or chills. His weight has been stable.   PAST MEDICAL HISTORY:  Past Medical History  Diagnosis Date  . Anxiety   . Diverticulitis   . Gastroduodenitis   .  Hypertension     bp runs high sometimes, no bp meds  . Depression   . GERD (gastroesophageal reflux disease)     no meds for  . Blood in stool 5--31-2015  . Headache      PAST SURGICAL HISTORY: Past Surgical History  Procedure Laterality Date  . Colonoscopy N/A 05/09/2013    Procedure: COLONOSCOPY;  Surgeon: Irene Shipper, MD;  Location: [facility] END[facility]COPY;  Service: Endoscopy;  Laterality: N/A;  .  Esophagogastroduodenoscopy N/A 05/09/2013    Procedure: ESOPHAGOGASTRODUODENOSCOPY (EGD);  Surgeon: Irene Shipper, MD;  Location: [facility];  Service: Endoscopy;  Laterality: N/A;  possible egd depending on colon results  . Tonsillectomy  as child  . Laparoscopic appendectomy N/A 08/12/2013    Procedure: LAPAROSCOPIC ASSISTED APPENDECTOMY;  Surgeon: Adin Hector, MD;  Location: [facility] ORS;  Service: General;  Laterality: N/A;  . Bowel resection N/A 08/12/2013    Procedure: SMALL BOWEL ILEAL RESECTION;  Surgeon: Adin Hector, MD;  Location: [facility]S;  Service: General;  Laterality: N/A;     CURRENT MEDICATIONS: has a current medication list which includes the following prescription(s): cyclobenzaprine, diphenoxylate-atropine, fluticasone, losartan, naproxen, sertraline, sumatriptan, and trazodone.   ALLERGIES: Review of patient's allergies indicates no known allergies.   SOCIAL HISTORY:  reports that he has been smoking Cigarettes.  He has a 5 pack-year smoking history. He has never used smokeless tobacco. He reports that he drinks alcohol. He reports that he uses illicit drugs (Marijuana).   FAMILY HISTORY: family history includes Anuerysm in his mother; Diabetes in his paternal aunt, paternal grandmother, and paternal uncle; Hypertension in his paternal aunt and paternal grandmother; Liver disease in his paternal uncle.   LABORATORY DATA:  CBC Latest Ref Rng 04/14/2014 03/26/2014 02/23/2014  WBC 4.0 - 10.3 10e3/uL 5.7 6.2 5.8  Hemoglobin 13.0 - 17.1 g/dL 13.0 13.3 14.0  Hematocrit 38.4 - 49.9 % 39.3 39.7 41.5  Platelets 140 - 400 10e3/uL 258 252.0 268    CMP Latest Ref Rng 04/14/2014 03/26/2014 02/23/2014  Glucose 70 - 140 mg/dl 96 133(H) 95  BUN 7.0 - 26.0 mg/dL 9.5 9 9.8  Creatinine 0.7 - 1.3 mg/dL 1.3 1.31 1.3  Sodium 136 - 145 mEq/L 142 139 141  Potassium 3.5 - 5.1 mEq/L 4.3 3.6 4.3  Chloride 96 - 112 mEq/L - 107 -  CO2 22 - 29 mEq/L _0 Calcium 8.4 - 10.4 mg/dL 8.9 9.3 9.4    Total Protein 6.4 - 8.3 g/dL 6.7 6.9 7.0  Total Bilirubin 0.20 - 1.20 mg/dL 0.43 0.4 0.69  Alkaline Phos 40 - 150 U/L 75 70 75  AST 5 - 34 U/L _1 ALT 0 - 55 U/L _2 PATHOLOGY:  Diagnosis 1. Appendix, Other than Incidental - BENIGN APPENDICEAL TISSUE WITH REACTIVE LYMPHOID HYPERPLASIA AND FIBROUS OBLITERANS. - NO EVIDENCE OF CARCINOID TUMOR. - NO EVIDENCE OF ACTIVE INFLAMMATION OR NEOPLASIA. 2. Small intestine, resection for tumor, ileal with ileal mass, mesenteric mass - WELL DIFFERENTIATED NEUROENDOCRINE TUMOR (CARCINOID TUMOR), INVADING THROUGH THE MUSCULARIS PROPRIA INTO SUBSEROSAL TISSUE. - ANGIOLYMPHATIC INVASION PRESENT. - THREE OF SIX LYMPH NODES, POSITIVE FOR CARCINOID TUMOR (3/6). - RESECTION MARGINS, NEGATIVE FOR ATYPIA OR MALIGNANCY. Microscopic Comment 2. 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 Histologic Grade : G1, Low grade Mitotic  Rate < 2/10 high-power fields (HIGH POWER FIELD), Ki-67: <3% Microscopic Tumor Extension: Invading through the muscularis propria into subserosal tissue. Margins: Negative Proximal Margin: 8 cm Distal Margin: 19.5 cm Mesenteric (Radial) Margin : 10 cm from the mucosa lesion and 0.1 cm from the positive mesenteric lymph node, please see comment for details. Distance to the closest margin: please see comment for details.Lymph-Vascular Invasion: Present Perineural Invasion: Not identified Lymph nodes: number examined 6; number positive: 3. TNM Staging: pT3, pN1 1 of 3 FINAL for MONTREZ, MARIETTA ([id number]) Microscopic Comment(continued) Additional findings: N/A Comments: The mucosal mass in the ileum measures 1.5 cm. The tumor cells invade through the muscularis propria into the subserosal tissue  without penetration of the inked serosa.Immunohistochemical stains were performed and Ki-67 rate is less than 3%. 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 The mucosa tumor is 10 cm from the mesenteric resection margin. The tumor in the partially replaced lymph node is 0.1 cm from the inked mesenteric margin. The case was discussed with Dr. Neysa Bonito on 08/13/2013. (HCL:kh 08/13/13) H. CATHERINE LI MDPathologist, Electronic Signature(Case signed 08/14/2013)   REVIEW OF SYSTEMS:  Constitutional: Denies fevers, chills or abnormal weight loss Eyes: Denies blurriness of vision Ears, nose, mouth, throat, and face: Denies mucositis or sore throat Respiratory: Denies cough, dyspnea or wheezes Cardiovascular: Denies palpitation, chest discomfort or lower extremity swelling Gastrointestinal:  Denies nausea, heartburn; diarrhea as noted in HPI.  Skin: Denies abnormal skin rashes Lymphatics: Denies new lymphadenopathy or easy bruising Neurological:Denies numbness, tingling or new weaknesses Behavioral/Psych: Mood is stable, no new changes  All other systems were reviewed with the patient and are negative.  PHYSICAL EXAM:  height is _0  (1.753 m) and weight is 218 lb 3.2 oz (98.975 kg). His oral temperature is 98 F (36.7 C). His blood pressure is 124/70 and his pulse is 70. His respiration is 18.    GENERAL:alert, no distress and comfortable; anxious, well developed and well nourished.  SKIN: skin color, texture, turgor are normal, no rashes or significant lesions EYES: normal, Conjunctiva are pink and non-injected, sclera clear OROPHARYNX:no exudate, no erythema and lips, buccal mucosa, and tongue normal  NECK: supple, thyroid normal size, non-tender, without nodularity LYMPH:  no palpable lymphadenopathy in the cervical, axillary or inguinal LUNGS: clear to auscultation and percussion with normal breathing  effort HEART: regular rate & rhythm and no murmurs and no lower extremity edema ABDOMEN:abdomen soft, mild tenderness at mid and left upper quadrant, normal bowel sounds; abdominal mid-line incision well healed; Musculoskeletal:no cyanosis of digits and no clubbing NEURO: alert & oriented x 3 with fluent speech, no focal motor/sensory deficits   RADIOGRAPHY:  MR ABDOMEN W WO CONTRAST 11/06/2013  CLINICAL DATA: Carcinoid tumor of ileum. Surgery 6/15. Intermittent abdominal pain. EXAM: MRI ABDOMEN WITHOUT AND WITH CONTRAST  TECHNIQUE: Multiplanar multisequence MR imaging of the abdomen was performed both before and after the administration of intravenous contrast.  CONTRAST: 26m MULTIHANCE GADOBENATE DIMEGLUMINE 529 MG/ML IV SOLN 18 cc MultiHance  COMPARISON: 05/30/2013 and CT of 05/05/2013.  FINDINGS: Normal heart size without pericardial or pleural effusion. A tiny cyst in the right lobe of the liver on image 28 of series 8. No suspicious liver lesions and no abnormal foci of arterial hyper enhancement. Normal spleen, stomach, pancreas, gallbladder, biliary tract, adrenal glands, kidneys. No abdominal adenopathy or ascites. Normal caliber of abdominal bowel loops. Small jejunal mesenteric nodes are likely reactive. No evidence  omental/peritoneal disease. IMPRESSION: No acute process or evidence of metastatic disease within the abdomen. Electronically Signed  By: Abigail Miyamoto M.D. On: 11/06/2013 11:31  CT chest, abdomen and pelvis 05/04/2014 IMPRESSION: Status post distal ileal resection and appendectomy.  Small upper abdominal lymph nodes, including a 11 mm short axis portacaval node. Additional small thoracic nodes measuring up to 7 mm. These findings are generally considered within the upper limits of normal.  Otherwise, no findings to suggest recurrent or metastatic disease.    IMPRESSION: Mitsugi Schrader is a 44 y.o. male with a history of LOW GRADE CARCINOID s/p resection on  08/12/2013.    PLAN:  1.  Carcinoid of the Ileum s/p resection, Stage III (pT3,pN1) -- I reviewed his CT of chest abdomen and pelvis with him, which showed small upper abdominal lymph nodes, no other evidence of recurrence. His chromogranin and urine 5 HIAA levels are normal. I do not think he has disease recurrence. -I'll continue surveillance.  2.  Chronic diarrhea -Likely secondary to surgery. - I recommend continue Imodium and Lomotil as needed. His only taking sporadically. -Avoid high-fiber diet -If his diarrhea persists or getting worse, I will refer him back to GI to consider endoscopy.  3. Chronic Headaches, Insomnia, fatigue -- follow up with his neurologist   Follow-up: Return in 3 months with lab.  All questions were answered. The patient knows to call the clinic with any problems, questions or concerns. We can certainly see the patient much sooner if necessary.  I spent 20 minutes counseling the patient face to face. The total time spent in the appointment was 25 minutes.   Truitt Merle  05/05/2014

## 2014-05-05 NOTE — Telephone Encounter (Signed)
gv and printed appt sched and avs for pt for June... °

## 2014-05-06 ENCOUNTER — Telehealth: Payer: Self-pay | Admitting: *Deleted

## 2014-05-06 NOTE — Telephone Encounter (Signed)
-----   Message from Dudley Major, DO sent at 05/06/2014  7:49 AM EST ----- NCV reveals carpal tunnel syndrome in both wrists, which is likely causing the numbness in the hands.  Recommend getting two wrist splints to wear at night.  He can pick this up at any pharmacy (or he can go to [facility] if he wants a pair that would be a better fit).  ----- Message -----    From: Alda Berthold, DO    Sent: 05/04/2014   4:15 PM      To: Dudley Major, DO

## 2014-05-06 NOTE — Telephone Encounter (Signed)
Patient is aware of NCV study

## 2014-05-26 ENCOUNTER — Other Ambulatory Visit: Payer: 59

## 2014-05-26 ENCOUNTER — Ambulatory Visit: Payer: Self-pay | Admitting: Hematology

## 2014-05-27 ENCOUNTER — Other Ambulatory Visit: Payer: 59

## 2014-05-27 ENCOUNTER — Ambulatory Visit: Payer: 59 | Admitting: Hematology

## 2014-06-23 ENCOUNTER — Telehealth: Payer: Self-pay | Admitting: Family Medicine

## 2014-06-23 NOTE — Telephone Encounter (Signed)
Pre Visit letter sent  °

## 2014-06-27 ENCOUNTER — Emergency Department (HOSPITAL_COMMUNITY): Payer: 59

## 2014-06-27 ENCOUNTER — Encounter (HOSPITAL_COMMUNITY): Payer: Self-pay | Admitting: Emergency Medicine

## 2014-06-27 ENCOUNTER — Emergency Department (HOSPITAL_COMMUNITY)
Admission: EM | Admit: 2014-06-27 | Discharge: 2014-06-27 | Disposition: A | Discharge status: Home / Self Care | Payer: 59 | Attending: Emergency Medicine | Admitting: Emergency Medicine

## 2014-06-27 DIAGNOSIS — Y9389 Activity, other specified: Secondary | ICD-10-CM | POA: Insufficient documentation

## 2014-06-27 DIAGNOSIS — Y9289 Other specified places as the place of occurrence of the external cause: Secondary | ICD-10-CM | POA: Insufficient documentation

## 2014-06-27 DIAGNOSIS — Z720 Tobacco use: Secondary | ICD-10-CM | POA: Diagnosis not present

## 2014-06-27 DIAGNOSIS — F329 Major depressive disorder, single episode, unspecified: Secondary | ICD-10-CM | POA: Insufficient documentation

## 2014-06-27 DIAGNOSIS — S6991XA Unspecified injury of right wrist, hand and finger(s), initial encounter: Secondary | ICD-10-CM | POA: Diagnosis present

## 2014-06-27 DIAGNOSIS — Y998 Other external cause status: Secondary | ICD-10-CM | POA: Insufficient documentation

## 2014-06-27 DIAGNOSIS — S60221A Contusion of right hand, initial encounter: Secondary | ICD-10-CM | POA: Insufficient documentation

## 2014-06-27 DIAGNOSIS — X58XXXA Exposure to other specified factors, initial encounter: Secondary | ICD-10-CM | POA: Insufficient documentation

## 2014-06-27 DIAGNOSIS — F419 Anxiety disorder, unspecified: Secondary | ICD-10-CM | POA: Diagnosis not present

## 2014-06-27 DIAGNOSIS — Z7951 Long term (current) use of inhaled steroids: Secondary | ICD-10-CM | POA: Diagnosis not present

## 2014-06-27 DIAGNOSIS — I10 Essential (primary) hypertension: Secondary | ICD-10-CM | POA: Diagnosis not present

## 2014-06-27 DIAGNOSIS — Z791 Long term (current) use of non-steroidal anti-inflammatories (NSAID): Secondary | ICD-10-CM | POA: Insufficient documentation

## 2014-06-27 DIAGNOSIS — Z8719 Personal history of other diseases of the digestive system: Secondary | ICD-10-CM | POA: Insufficient documentation

## 2014-06-27 DIAGNOSIS — Z79899 Other long term (current) drug therapy: Secondary | ICD-10-CM | POA: Insufficient documentation

## 2014-06-27 MED ORDER — IBUPROFEN 800 MG PO TABS: 800.0000 mg | ORAL_TABLET | Freq: Three times a day (TID) | ORAL | Status: DC

## 2014-06-27 MED ORDER — IBUPROFEN 800 MG PO TABS
800.0000 mg | ORAL_TABLET | Freq: Once | ORAL | Status: AC
  Administered 2014-06-27: 800 mg via ORAL
  Filled 2014-06-27: qty 1

## 2014-06-27 NOTE — Discharge Instructions (Signed)
Contusion °A contusion is a deep bruise. Contusions are the result of an injury that caused bleeding under the skin. The contusion may turn blue, purple, or yellow. Minor injuries will give you a painless contusion, but more severe contusions may stay painful and swollen for a few weeks.  °CAUSES  °A contusion is usually caused by a blow, trauma, or direct force to an area of the body. °SYMPTOMS  °· Swelling and redness of the injured area. °· Bruising of the injured area. °· Tenderness and soreness of the injured area. °· Pain. °DIAGNOSIS  °The diagnosis can be made by taking a history and physical exam. An X-ray, CT scan, or MRI may be needed to determine if there were any associated injuries, such as fractures. °TREATMENT  °Specific treatment will depend on what area of the body was injured. In general, the best treatment for a contusion is resting, icing, elevating, and applying cold compresses to the injured area. Over-the-counter medicines may also be recommended for pain control. Ask your caregiver what the best treatment is for your contusion. °HOME CARE INSTRUCTIONS  °· Put ice on the injured area. °¨ Put ice in a plastic bag. °¨ Place a towel between your skin and the bag. °¨ Leave the ice on for 15-20 minutes, 3-4 times a day, or as directed by your health care provider. °· Only take over-the-counter or prescription medicines for pain, discomfort, or fever as directed by your caregiver. Your caregiver may recommend avoiding anti-inflammatory medicines (aspirin, ibuprofen, and naproxen) for 48 hours because these medicines may increase bruising. °· Rest the injured area. °· If possible, elevate the injured area to reduce swelling. °SEEK IMMEDIATE MEDICAL CARE IF:  °· You have increased bruising or swelling. °· You have pain that is getting worse. °· Your swelling or pain is not relieved with medicines. °MAKE SURE YOU:  °· Understand these instructions. °· Will watch your condition. °· Will get help right  away if you are not doing well or get worse. °Document Released: 11/30/2004 Document Revised: 02/25/2013 Document Reviewed: 12/26/2010 °ExitCare® Patient Information ©2015 [employer]. This information is not intended to replace advice given to you by your health care provider. Make sure you discuss any questions you have with your health care provider. ° °

## 2014-06-27 NOTE — ED Provider Notes (Signed)
CSN: [id number]     Arrival date & time 06/27/14  1111 History   First MD Initiated Contact with Patient 06/27/14 1114     Chief Complaint  Patient presents with  . Hand Injury     (Consider location/radiation/quality/duration/timing/severity/associated sxs/prior Treatment) Patient is a 44 y.o. male presenting with hand injury. The history is provided by the patient. No language interpreter was used.  Hand Injury Location:  Hand Time since incident:  1 day Injury: yes   Hand location:  R hand Pain details:    Quality:  Aching   Radiates to:  Does not radiate   Severity:  Moderate   Timing:  Constant Chronicity:  New Foreign body present:  No foreign bodies Relieved by:  None tried Worsened by:  Nothing tried Ineffective treatments:  None tried Risk factors: no known bone disorder   Pt reports he hit hand while moving furniture.  Past Medical History  Diagnosis Date  . Anxiety   . Diverticulitis   . Gastroduodenitis   . Hypertension     bp runs high sometimes, no bp meds  . Depression   . GERD (gastroesophageal reflux disease)     no meds for  . Blood in stool 5--31-2015  . Headache    Past Surgical History  Procedure Laterality Date  . Colonoscopy N/A 05/09/2013    Procedure: COLONOSCOPY;  Surgeon: Irene Shipper, MD;  Location: [facility];  Service: Endoscopy;  Laterality: N/A;  . Esophagogastroduodenoscopy N/A 05/09/2013    Procedure: ESOPHAGOGASTRODUODENOSCOPY (EGD);  Surgeon: Irene Shipper, MD;  Location: [facility];  Service: Endoscopy;  Laterality: N/A;  possible egd depending on colon results  . Tonsillectomy  as child  . Laparoscopic appendectomy N/A 08/12/2013    Procedure: LAPAROSCOPIC ASSISTED APPENDECTOMY;  Surgeon: Adin Hector, MD;  Location: [facility];  Service: General;  Laterality: N/A;  . Bowel resection N/A 08/12/2013    Procedure: SMALL BOWEL ILEAL RESECTION;  Surgeon: Adin Hector, MD;  Location: [facility];  Service: General;  Laterality: N/A;    Family History  Problem Relation Age of Onset  . Diabetes Paternal Grandmother   . Hypertension Paternal Grandmother   . Diabetes Paternal Uncle   . Diabetes Paternal Aunt   . Liver disease Paternal Uncle   . Hypertension Paternal Aunt   . Anuerysm Mother     brain   History  Substance Use Topics  . Smoking status: Current Every Day Smoker -- 0.50 packs/day for 10 years    Types: Cigarettes  . Smokeless tobacco: Never Used  . Alcohol Use: 0.0 oz/week    0 Standard drinks or equivalent per week     Comment: 2-3 beers some days    Review of Systems  Musculoskeletal: Positive for myalgias and joint swelling.  All other systems reviewed and are negative.     Allergies  Review of patient's allergies indicates no known allergies.  Home Medications   Prior to Admission medications   Medication Sig Start Date End Date Taking? Authorizing Provider  cyclobenzaprine (FLEXERIL) 10 MG tablet Take 1 tablet (10 mg total) by mouth 3 (three) times daily as needed for muscle spasms. 04/24/14   Mary-Margaret Hassell Done, FNP  diphenoxylate-atropine (LOMOTIL) 2.5-0.025 MG per tablet Take 1 tablet by mouth 4 (four) times daily as needed for diarrhea or loose stools. May take every 6 hours prn diarrhea-Max of 8 tab/day 05/05/14   Truitt Merle, MD  fluticasone [facility]) 50 MCG/ACT nasal spray Place 2 sprays into both  nostrils daily. 11/20/13   [name], MD  losartan (COZAAR) 50 MG tablet Take 1 tablet (50 mg total) by mouth daily. 11/20/13   [name], MD  naproxen (NAPROSYN) 500 MG tablet Take 1 tablet (500 mg total) by mouth 2 (two) times daily with a meal. 04/24/14   Mary-Margaret Hassell Done, FNP  sertraline (ZOLOFT) 50 MG tablet Take 2 tablets (100 mg total) by mouth daily. 04/14/14   Pieter Partridge, DO  SUMAtriptan (IMITREX) 100 MG tablet Take 1 tablet (100 mg total) by mouth once as needed for migraine or headache. May repeat in 2 hours if headache persists or recurs. 02/06/14   Pieter Partridge,  DO  traZODone (DESYREL) 50 MG tablet Take 0.5-1 tablets (25-50 mg total) by mouth at bedtime as needed for sleep. 03/26/14   [name], MD   BP 148/85 mmHg  Pulse 93  Temp(Src) 98.7 F (37.1 C) (Oral)  Resp 18  SpO2 100% Physical Exam  Constitutional: He is oriented to person, place, and time. He appears well-developed and well-nourished.  HENT:  Head: Normocephalic.  Musculoskeletal: He exhibits tenderness.  Tender right hand at 4th metacarpal,   Deformity,  nv and ns intact,    Neurological: He is alert and oriented to person, place, and time. He has normal reflexes.  Skin: Skin is warm.  Psychiatric: He has a normal mood and affect.  Nursing note and vitals reviewed.   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 Initial encounter.  EXAM: RIGHT HAND - COMPLETE 3+ VIEW  COMPARISON:  03/12/2009.  FINDINGS: Old healed fracture involving the 4th metacarpal. No evidence of acute fracture or dislocation. Well preserved bone mineral density. Well preserved joint spaces.  IMPRESSION: No acute osseous abnormality.  Old healed 4th metacarpal fracture.   Electronically Signed   By: Evangeline Dakin M.D.   On: 06/27/2014 12:17     EKG Interpretation None      MDM  Old fracture well healed,      Final diagnoses:  Contusion of right hand, initial encounter    ACe Ibuprofen Follow up with Dr. Amedeo Plenty if symptoms persist    Fransico Meadow, PA-C 06/27/14 [place], MD 07/04/14 0030

## 2014-06-27 NOTE — ED Notes (Signed)
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 States he tried to catch it with his right hand when he injured it.  Swelling noted.

## 2014-07-13 ENCOUNTER — Ambulatory Visit (INDEPENDENT_AMBULATORY_CARE_PROVIDER_SITE_OTHER): Payer: 59 | Admitting: Neurology

## 2014-07-13 ENCOUNTER — Telehealth: Payer: Self-pay | Admitting: *Deleted

## 2014-07-13 ENCOUNTER — Encounter: Payer: Self-pay | Admitting: *Deleted

## 2014-07-13 ENCOUNTER — Encounter: Payer: Self-pay | Admitting: Neurology

## 2014-07-13 VITALS — BP 126/78 | HR 78 | Resp 16 | Ht 69.0 in | Wt 220.4 lb

## 2014-07-13 DIAGNOSIS — G5601 Carpal tunnel syndrome, right upper limb: Secondary | ICD-10-CM

## 2014-07-13 DIAGNOSIS — G43009 Migraine without aura, not intractable, without status migrainosus: Secondary | ICD-10-CM

## 2014-07-13 DIAGNOSIS — G5603 Carpal tunnel syndrome, bilateral upper limbs: Secondary | ICD-10-CM | POA: Insufficient documentation

## 2014-07-13 DIAGNOSIS — G5602 Carpal tunnel syndrome, left upper limb: Secondary | ICD-10-CM

## 2014-07-13 NOTE — Patient Instructions (Signed)
1.  Continue Zoloft 100mg  daily.  Take the sumatriptan when you get the headache. 2.  Will refer you to a hand specialist regarding the carpal tunnel syndrome 3.  Follow up in 3 months.

## 2014-07-13 NOTE — Telephone Encounter (Signed)
Pre-Visit Call completed with patient and chart updated.   Pre-Visit Info documented in Specialty Comments under [employer].    

## 2014-07-13 NOTE — Progress Notes (Signed)
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  NCV-EMG reviewed.  UPDATE: Headaches improved Intensity:  dull.  More of a dizziness Duration:  quickly with sumatriptan.   Frequency:  2 days per week. Current abortive therapy:  Sumatriptan 100mg , Tylenol (takes 5 days per week) Current preventative therapy:  Zoloft 100mg  Other medications:  Cozaar  For hand numbness, he was advised to try wrist splints at night.  It was ineffective.  NCV-EMG was performed on 05/04/14 which confirmed bilateral carpal tunnel syndrome.   HISTORY: Onset:  Mid-30s Location:  Right frontal Quality:  Dull and gradually progresses to throbbing Initial Intensity:  2/10 dull, 9/10 severe; February 6/10 Aura:  no Prodrome:  no Associated symptoms:  Photophobia, phonophobia, blurred vision.  Occasional nausea.  No vomiting. Initial Duration:  2-4 hours; February 15-20 minutes with sumatriptan Initial Frequency:  Daily (severe headaches occur 15 days/month); February 8-10 days per month.  Also daily dull headache. Triggers/exacerbating factors:  nothing Relieving factors:  rest Activity:  Able to force self to work.  He works as a Actor  Past abortive therapy:  Aleve (dulls pain), Tylenol (ineffective), Vicodin (dulls pain), BC powder, Goodys Past preventative therapy:  none  MRI and MRA of head was performed on 02/26/14, which showed mild punctate T2 and FLAIR hyperintensities in the periventricular and subcortical white matter, consistent with chronic small vessel ischemic changes.  No aneurysm seen.  He also reports bilateral hand numbness.  It occurs in bed and while driving.  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  He  has to shake out his hands.  No radicular pain from the neck.  PAST MEDICAL HISTORY: Past Medical History  Diagnosis Date  . Anxiety   . Diverticulitis   . Gastroduodenitis   . Hypertension     bp runs high sometimes, no bp meds  . Depression   . GERD (gastroesophageal reflux disease)     no meds for  . Blood in stool 5--31-2015  . Headache     MEDICATIONS: Current Outpatient Prescriptions on File Prior to Visit  Medication Sig Dispense Refill  . cyclobenzaprine (FLEXERIL) 10 MG tablet Take 1 tablet (10 mg total) by mouth 3 (three) times daily as needed for muscle spasms. 30 tablet 1  . diphenoxylate-atropine (LOMOTIL) 2.5-0.025 MG per tablet Take 1 tablet by mouth 4 (four) times daily as needed for diarrhea or loose stools. May take every 6 hours prn diarrhea-Max of 8 tab/day 60 tablet 2  . fluticasone (FLONASE) 50 MCG/ACT nasal spray Place 2 sprays into both nostrils daily. 16 g 6  . ibuprofen (ADVIL,MOTRIN) 800 MG tablet Take 1 tablet (800 mg total) by mouth 3 (three) times daily. 21 tablet 0  . losartan (COZAAR) 50 MG tablet Take 1 tablet (50 mg total) by mouth daily. 90 tablet 3  . naproxen (NAPROSYN) 500 MG tablet Take 1 tablet (500 mg total) by mouth 2 (two) times daily with a meal. 30 tablet 0  . sertraline (ZOLOFT) 50 MG tablet Take 2 tablets (100 mg total) by mouth daily. 60 tablet 3  . SUMAtriptan (IMITREX) 100 MG tablet Take 1 tablet (100 mg total) by mouth once as needed for migraine or headache. May repeat in 2 hours  if headache persists or recurs. 9 tablet 2  . traZODone (DESYREL) 50 MG tablet Take 0.5-1 tablets (25-50 mg total) by mouth at bedtime as needed for sleep. 30 tablet 3   No current facility-administered medications on file prior to visit.    ALLERGIES: No Known Allergies  FAMILY HISTORY: Family History  Problem Relation Age of Onset  . Diabetes Paternal Grandmother   . Hypertension Paternal Grandmother   . Diabetes Paternal Uncle   . Diabetes  Paternal Aunt   . Liver disease Paternal Uncle   . Hypertension Paternal Aunt   . Anuerysm Mother     brain    SOCIAL HISTORY: History   Social History  . Marital Status: Single    Spouse Name: N/A  . Number of Children: 6  . 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 Smoking status: Current Every Day Smoker -- 0.50 packs/day for 10 years    Types: Cigarettes  . Smokeless tobacco: Never Used  . Alcohol Use: 0.0 oz/week    0 Standard drinks or equivalent per week     Comment: 2-3 beers some days  . Drug Use: Yes    Special: Marijuana  . Sexual Activity:    Partners: Female   Other Topics Concern  . Not on file   Social History Narrative    REVIEW OF SYSTEMS: Constitutional: No fevers, chills, or sweats, no generalized fatigue, change in appetite Eyes: No visual changes, double vision, eye pain Ear, nose and throat: No hearing loss, ear pain, nasal congestion, sore throat Cardiovascular: No chest pain, palpitations Respiratory:  No shortness of breath at rest or with exertion, wheezes GastrointestinaI: No nausea, vomiting, diarrhea, abdominal pain, fecal incontinence Genitourinary:  No dysuria, urinary retention or frequency Musculoskeletal:  No neck pain, back pain Integumentary: No rash, pruritus, skin lesions Neurological: as above Psychiatric: No depression, insomnia, anxiety Endocrine: No palpitations, fatigue, diaphoresis, mood swings, change in appetite, change in weight, increased thirst Hematologic/Lymphatic:  No anemia, purpura, petechiae. Allergic/Immunologic: no itchy/runny eyes, nasal congestion, recent allergic reactions, rashes  PHYSICAL EXAM: Filed Vitals:   07/13/14 0931  BP: 126/78  Pulse: 78  Resp: 16   General: No acute distress Head:  Normocephalic/atraumatic Eyes:  Fundoscopic exam unremarkable without vessel changes, exudates, hemorrhages or papilledema. Neck:  supple, no paraspinal tenderness, full range of motion Heart:  Regular rate and rhythm Lungs:  Clear to auscultation bilaterally Back: No paraspinal tenderness Neurological Exam: alert and oriented to person, place, and time. Attention span and concentration intact, recent and remote memory intact, fund of knowledge intact.  Speech fluent and not dysarthric, language intact.  CN II-XII intact. Fundoscopic exam unremarkable without vessel changes, exudates, hemorrhages or papilledema.  Bulk and tone normal, muscle strength 5/5 throughout.  Sensation to light touch, temperature and vibration intact.  Deep tendon reflexes 2+ throughout, toes downgoing.  Finger to nose and heel to shin testing intact.  Gait normal  IMPRESSION: Migraine without aura Bilateral carpal tunnel syndrome  PLAN: 1.  Continue Zoloft 100mg  daily and sumatriptan as needed for migraine 2.  Refer to hand specialist 3.  Follow up in 3 months.  15 minutes spent with patient, over 50% spent discussing plan.   Metta Clines, DO  CC:  Annye Asa, MD

## 2014-07-14 ENCOUNTER — Ambulatory Visit (INDEPENDENT_AMBULATORY_CARE_PROVIDER_SITE_OTHER): Payer: 59 | Admitting: Family Medicine

## 2014-07-14 ENCOUNTER — Encounter: Payer: Self-pay | Admitting: Family Medicine

## 2014-07-14 VITALS — BP 146/90 | HR 87 | Temp 98.1°F | Resp 16 | Ht 69.5 in | Wt 218.4 lb

## 2014-07-14 DIAGNOSIS — Z0000 Encounter for general adult medical examination without abnormal findings: Secondary | ICD-10-CM

## 2014-07-14 DIAGNOSIS — I10 Essential (primary) hypertension: Secondary | ICD-10-CM | POA: Diagnosis not present

## 2014-07-14 LAB — CBC WITH DIFFERENTIAL/PLATELET
BASOS PCT: 0.6 % (ref 0.0–3.0)
Basophils Absolute: 0 10*3/uL (ref 0.0–0.1)
EOS ABS: 0.2 10*3/uL (ref 0.0–0.7)
Eosinophils Relative: 3.2 % (ref 0.0–5.0)
HCT: 41.8 % (ref 39.0–52.0)
Hemoglobin: 14.3 g/dL (ref 13.0–17.0)
LYMPHS PCT: 32.6 % (ref 12.0–46.0)
Lymphs Abs: 2.2 10*3/uL (ref 0.7–4.0)
MCHC: 34.2 g/dL (ref 30.0–36.0)
MCV: 81.8 fl (ref 78.0–100.0)
Monocytes Absolute: 0.6 10*3/uL (ref 0.1–1.0)
Monocytes Relative: 9.2 % (ref 3.0–12.0)
NEUTROS PCT: 54.4 % (ref 43.0–77.0)
Neutro Abs: 3.6 10*3/uL (ref 1.4–7.7)
Platelets: 263 10*3/uL (ref 150.0–400.0)
RBC: 5.11 Mil/uL (ref 4.22–5.81)
RDW: 14.3 % (ref 11.5–15.5)
WBC: 6.7 10*3/uL (ref 4.0–10.5)

## 2014-07-14 LAB — LIPID PANEL
Cholesterol: 226 mg/dL — ABNORMAL HIGH (ref 0–200)
HDL: 53.5 mg/dL (ref >39.00)
LDL CALC: 150 mg/dL — AB (ref 0–99)
NonHDL: 172.5
Total CHOL/HDL Ratio: 4
Triglycerides: 111 mg/dL (ref 0.0–149.0)
VLDL: 22.2 mg/dL (ref 0.0–40.0)

## 2014-07-14 LAB — BASIC METABOLIC PANEL
BUN: 11 mg/dL (ref 6–23)
CALCIUM: 9.5 mg/dL (ref 8.4–10.5)
CO2: 29 meq/L (ref 19–32)
CREATININE: 1.26 mg/dL (ref 0.40–1.50)
Chloride: 103 mEq/L (ref 96–112)
GFR: 80.27 mL/min (ref >60.00)
Glucose, Bld: 82 mg/dL (ref 70–99)
Potassium: 4.3 mEq/L (ref 3.5–5.1)
Sodium: 138 mEq/L (ref 135–145)

## 2014-07-14 LAB — HEPATIC FUNCTION PANEL
ALT: 16 U/L (ref 0–53)
AST: 17 U/L (ref 0–37)
Albumin: 3.9 g/dL (ref 3.5–5.2)
Alkaline Phosphatase: 73 U/L (ref 39–117)
BILIRUBIN DIRECT: 0.1 mg/dL (ref 0.0–0.3)
BILIRUBIN TOTAL: 0.3 mg/dL (ref 0.2–1.2)
Total Protein: 7 g/dL (ref 6.0–8.3)

## 2014-07-14 LAB — TSH: TSH: 0.6 u[IU]/mL (ref 0.35–4.50)

## 2014-07-14 MED ORDER — LOSARTAN POTASSIUM 50 MG PO TABS: 50.0000 mg | ORAL_TABLET | Freq: Every day | ORAL | Status: DC

## 2014-07-14 MED ORDER — TRAZODONE HCL 50 MG PO TABS: 25.0000 mg | ORAL_TABLET | Freq: Every evening | ORAL | Status: DC | PRN

## 2014-07-14 NOTE — Progress Notes (Signed)
   Subjective:    Patient ID: Leslie Mcmillan, male    DOB: 11/15/1970, 44 y.o.   MRN: [id number]  HPI CPE- no concerns today.  Pt admits to 'being slack' on BP meds.  Plans to restart.   Review of Systems Patient reports no vision/hearing changes, anorexia, fever ,adenopathy, persistant/recurrent hoarseness, swallowing issues, chest pain, palpitations, edema, persistant/recurrent cough, hemoptysis, dyspnea (rest,exertional, paroxysmal nocturnal), gastrointestinal  bleeding (melena, rectal bleeding), abdominal pain, excessive heart burn, GU symptoms (dysuria, hematuria, voiding/incontinence issues) syncope, focal weakness, memory loss, skin/hair/nail changes, depression, anxiety, abnormal bruising/bleeding, musculoskeletal symptoms/signs.   + numbness of hands due to carpal tunnel    Objective:   Physical Exam General Appearance:    Alert, cooperative, no distress, appears stated age  Head:    Normocephalic, without obvious abnormality, atraumatic  Eyes:    PERRL, conjunctiva/corneas clear, EOM's intact, fundi    benign, both eyes       Ears:    Normal TM's and external ear canals, both ears  Nose:   Nares normal, septum midline, mucosa normal, no drainage   or sinus tenderness  Throat:   Lips, mucosa, and tongue normal; teeth and gums normal  Neck:   Supple, symmetrical, trachea midline, no adenopathy;       thyroid:  No enlargement/tenderness/nodules  Back:     Symmetric, no curvature, ROM normal, no CVA tenderness  Lungs:     Clear to auscultation bilaterally, respirations unlabored  Chest wall:    No tenderness or deformity  Heart:    Regular rate and rhythm, S1 and S2 normal, no murmur, rub   or gallop  Abdomen:     Soft, non-tender, bowel sounds active all four quadrants,    no masses, no organomegaly  Genitalia:    Normal male without lesion, masses,discharge or tenderness  Rectal:    Deferred due to young age  Extremities:   Extremities normal, atraumatic, no cyanosis or  edema  Pulses:   2+ and symmetric all extremities  Skin:   Skin color, texture, turgor normal, no rashes or lesions  Lymph nodes:   Cervical, supraclavicular, and axillary nodes normal  Neurologic:   CNII-XII intact. Normal strength, sensation and reflexes      throughout          Assessment & Plan:

## 2014-07-14 NOTE — Assessment & Plan Note (Signed)
Pt's PE WNL.  UTD on GI.  Check labs.  Anticipatory guidance provided.

## 2014-07-14 NOTE — Progress Notes (Signed)
Pre visit review using our clinic review tool, if applicable. No additional management support is needed unless otherwise documented below in the visit note. 

## 2014-07-14 NOTE — Patient Instructions (Signed)
Follow up in 4-6 weeks to recheck BP We'll notify you of your lab results and make any changes if needed Restart the blood pressure medication Keep up the good work on quitting smoking!  You can do this! Call with any questions or concerns [place]!!

## 2014-07-14 NOTE — Assessment & Plan Note (Signed)
Deteriorated b/c pt stopped his BP meds.  Restart.  Stressed need for compliance.  Pt is working on quitting smoking, too.  Applauded his efforts.  Will follow closely.

## 2014-08-11 ENCOUNTER — Other Ambulatory Visit (HOSPITAL_BASED_OUTPATIENT_CLINIC_OR_DEPARTMENT_OTHER): Payer: 59

## 2014-08-11 DIAGNOSIS — C7A012 Malignant carcinoid tumor of the ileum: Secondary | ICD-10-CM | POA: Diagnosis not present

## 2014-08-11 DIAGNOSIS — D3A012 Benign carcinoid tumor of the ileum: Secondary | ICD-10-CM

## 2014-08-11 LAB — CBC WITH DIFFERENTIAL/PLATELET
BASO%: 0.3 % (ref 0.0–2.0)
BASOS ABS: 0 10*3/uL (ref 0.0–0.1)
EOS ABS: 0.2 10*3/uL (ref 0.0–0.5)
EOS%: 3.3 % (ref 0.0–7.0)
HEMATOCRIT: 39.6 % (ref 38.4–49.9)
HEMOGLOBIN: 13.5 g/dL (ref 13.0–17.1)
LYMPH%: 33.8 % (ref 14.0–49.0)
MCH: 28.5 pg (ref 27.2–33.4)
MCHC: 34.1 g/dL (ref 32.0–36.0)
MCV: 83.5 fL (ref 79.3–98.0)
MONO#: 0.4 10*3/uL (ref 0.1–0.9)
MONO%: 6.2 % (ref 0.0–14.0)
NEUT%: 56.4 % (ref 39.0–75.0)
NEUTROS ABS: 3.6 10*3/uL (ref 1.5–6.5)
PLATELETS: 223 10*3/uL (ref 140–400)
RBC: 4.74 10*6/uL (ref 4.20–5.82)
RDW: 13.6 % (ref 11.0–14.6)
WBC: 6.3 10*3/uL (ref 4.0–10.3)
lymph#: 2.1 10*3/uL (ref 0.9–3.3)

## 2014-08-11 LAB — COMPREHENSIVE METABOLIC PANEL (CC13)
ALT: 18 U/L (ref 0–55)
AST: 19 U/L (ref 5–34)
Albumin: 3.6 g/dL (ref 3.5–5.0)
Alkaline Phosphatase: 65 U/L (ref 40–150)
Anion Gap: 8 mEq/L (ref 3–11)
BUN: 14.8 mg/dL (ref 7.0–26.0)
CO2: 27 mEq/L (ref 22–29)
CREATININE: 1.2 mg/dL (ref 0.7–1.3)
Calcium: 9 mg/dL (ref 8.4–10.4)
Chloride: 108 mEq/L (ref 98–109)
EGFR: 83 mL/min/{1.73_m2} — ABNORMAL LOW (ref >90)
Glucose: 114 mg/dl (ref 70–140)
POTASSIUM: 4 meq/L (ref 3.5–5.1)
Sodium: 143 mEq/L (ref 136–145)
TOTAL PROTEIN: 6.7 g/dL (ref 6.4–8.3)
Total Bilirubin: 0.68 mg/dL (ref 0.20–1.20)

## 2014-08-13 ENCOUNTER — Encounter: Payer: Self-pay | Admitting: Family Medicine

## 2014-08-13 ENCOUNTER — Ambulatory Visit (INDEPENDENT_AMBULATORY_CARE_PROVIDER_SITE_OTHER): Payer: 59 | Admitting: Family Medicine

## 2014-08-13 VITALS — BP 130/88 | HR 72 | Temp 98.0°F | Resp 16 | Wt 220.4 lb

## 2014-08-13 DIAGNOSIS — I10 Essential (primary) hypertension: Secondary | ICD-10-CM

## 2014-08-13 DIAGNOSIS — M25562 Pain in left knee: Secondary | ICD-10-CM | POA: Diagnosis not present

## 2014-08-13 DIAGNOSIS — M25561 Pain in right knee: Secondary | ICD-10-CM

## 2014-08-13 LAB — BASIC METABOLIC PANEL
BUN: 11 mg/dL (ref 6–23)
CO2: 29 meq/L (ref 19–32)
Calcium: 9.3 mg/dL (ref 8.4–10.5)
Chloride: 106 mEq/L (ref 96–112)
Creatinine, Ser: 1.23 mg/dL (ref 0.40–1.50)
GFR: 82.51 mL/min (ref >60.00)
Glucose, Bld: 99 mg/dL (ref 70–99)
Potassium: 4 mEq/L (ref 3.5–5.1)
Sodium: 138 mEq/L (ref 135–145)

## 2014-08-13 MED ORDER — CYCLOBENZAPRINE HCL 10 MG PO TABS: 10.0000 mg | ORAL_TABLET | Freq: Three times a day (TID) | ORAL | Status: DC | PRN

## 2014-08-13 MED ORDER — LOSARTAN POTASSIUM 100 MG PO TABS: 100.0000 mg | ORAL_TABLET | Freq: Every day | ORAL | Status: DC

## 2014-08-13 MED ORDER — CELECOXIB 200 MG PO CAPS: 200.0000 mg | ORAL_CAPSULE | Freq: Two times a day (BID) | ORAL | Status: DC

## 2014-08-13 NOTE — Assessment & Plan Note (Signed)
New.  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  Start daily Celebrex.  If no improvement will refer to ortho.  Pt expressed understanding and is in agreement w/ plan.

## 2014-08-13 NOTE — Assessment & Plan Note (Signed)
Much improved since starting Losartan at last visit.  Diastolic BP is still higher than goal so will increase Losartan to 100mg  daily.  Check labs.  Will continue to follow.

## 2014-08-13 NOTE — Patient Instructions (Signed)
Follow up in 4-6 weeks to recheck BP We'll notify you of your lab results and make any changes if needed Start the Celebrex once daily w/ food ICE your knees!! Increase the losartan to 100mg  daily- 2 of what you have at home and 1 of the new prescription If no improvement in the knee or back pain after 2 weeks, call or Mychart so we can send you to ortho Call with any questions or concerns Keep up the good work!  You look great!

## 2014-08-13 NOTE — Progress Notes (Signed)
   Subjective:    Patient ID: Leslie Mcmillan, male    DOB: [birth year], 44 y.o.   MRN: [id number]  HPI HTN- noted at time of last visit.  Pt was started on Losartan.  BP is much improved.   Denies CP, SOB, HAs, visual changes, edema.  Joint pain- sxs started 'months' ago.  Pt has bilateral knee pain and LBP.  Pt works as a Engineer, manufacturing systems.  Not taking regular NSAID.     Review of Systems For ROS see HPI     Objective:   Physical Exam  Constitutional: He is oriented to person, place, and time. He appears well-developed and well-nourished. No distress.  HENT:  Head: Normocephalic and atraumatic.  Eyes: Conjunctivae and EOM are normal. Pupils are equal, round, and reactive to light.  Neck: Normal range of motion. Neck supple. No thyromegaly present.  Cardiovascular: Normal rate, regular rhythm, normal heart sounds and intact distal pulses.   No murmur heard. Pulmonary/Chest: Effort normal and breath sounds normal. No respiratory distress.  Abdominal: Soft. Bowel sounds are normal. He exhibits no distension.  Musculoskeletal: He exhibits no edema.  Lymphadenopathy:    He has no cervical adenopathy.  Neurological: He is alert and oriented to person, place, and time. No cranial nerve deficit.  Skin: Skin is warm and dry.  Psychiatric: He has a normal mood and affect. His behavior is normal.  Vitals reviewed.         Assessment & Plan:

## 2014-08-13 NOTE — Progress Notes (Signed)
Pre visit review using our clinic review tool, if applicable. No additional management support is needed unless otherwise documented below in the visit note. 

## 2014-08-14 LAB — CHROMOGRANIN A: Chromogranin A: 15 ng/mL (ref <=15)

## 2014-08-18 ENCOUNTER — Ambulatory Visit (HOSPITAL_BASED_OUTPATIENT_CLINIC_OR_DEPARTMENT_OTHER): Payer: 59 | Admitting: Hematology

## 2014-08-18 ENCOUNTER — Encounter: Payer: Self-pay | Admitting: Hematology

## 2014-08-18 ENCOUNTER — Telehealth: Payer: Self-pay | Admitting: Hematology

## 2014-08-18 VITALS — BP 127/71 | HR 74 | Temp 98.2°F | Resp 17 | Ht 70.0 in | Wt 222.9 lb

## 2014-08-18 DIAGNOSIS — R197 Diarrhea, unspecified: Secondary | ICD-10-CM

## 2014-08-18 DIAGNOSIS — R5383 Other fatigue: Secondary | ICD-10-CM

## 2014-08-18 DIAGNOSIS — D3A012 Benign carcinoid tumor of the ileum: Secondary | ICD-10-CM

## 2014-08-18 DIAGNOSIS — C7A012 Malignant carcinoid tumor of the ileum: Secondary | ICD-10-CM

## 2014-08-18 DIAGNOSIS — G4700 Insomnia, unspecified: Secondary | ICD-10-CM

## 2014-08-18 DIAGNOSIS — R51 Headache: Secondary | ICD-10-CM | POA: Diagnosis not present

## 2014-08-18 NOTE — Progress Notes (Signed)
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 Johney Maine.     He reports that in winter of 2012, he abdominal pains described as tense muscle spasms, sharp pains that were severe in quality.  He went to the emergency room at [facility] and he reports that he was told that he inflammation and provided antibiotics and ibuprofen. CT of abdomen on 10/25/2010 showed that there was mild inflammation surrounding the proximal jejunum near the ligament of Treitz, findings which could represent focal enteritis. He had later complained of some severe headaches causing an emergency room visit [facility] and he had a CT of head in January 2015 which showed no acute intracranial abnormality.   He notes that there was slow progression of his abdominal pain over time.  Eventually, due to it recurrence and progression, he presented to [facility] ER and CT of abdomen and pelvis was unrevealing on 05/05/2013.  Due to progressive abdominal pain and rectal bleeding, he was referred to [facility] (Dr. Hilarie Fredrickson).  He had a Meckels scan on 05/22/2013  and MRI (05/30/2013) and capsule endoscopy.  Meckel's scan was without scintigraphic evidence of ectopic gastric mucosa and demonstrated that there was a subtle round soft tissue mass within the right lower quadrant which was difficult to distinguish from the adjacent loops of small bowel. This small mass measured 2.1 x 2.4 cm on 10/25/2010 increased slowly to 2.8 x 2.7 cm on CT of 05/05/2013. Common differential for this small slow growing lesion would be a  carcinoid tumor or a gastrointestinal stromal tumor. No retractile pattern typical of carcinoid.    MRI of abdomen and pelvis on 05/30/2013 showed a diffusely enhacing 3.0 x 2.0 x 2.2 cm mass in the  new mesentery with close associated mesenteric vessels but the lesion did not appear to invade the small bowel.  As noted on the Mecke's can, top differential diagnostic consideration would be GIST or carcinoid tumor.  Lymphoma was not completely excluded. Of note, he also had several jejunal loops that were somewhat featureless which could be due to fold thickening or effacement.   Based on results of these studies, he was referred to to Surgery (Dr. Johney Maine).  He was admitted for mesenteric mass and present carcinoid and underwent laparoscopic assisted resection of ileum and ymph nodes on 08/12/2013.  There were no complications.  His pathology noted carcinoid with 3/6 lymph nodes positive.  Medical oncology consultation was obtained and patient seen by Dr Juliann Mule on 09/04/2013.      INTERVAL HISTORY: He returns for follow up. His diarrhea is better overall, he was out of Lomotil, only takes Imodium as needed some time. No nausea, vomiting, or abdomen pain. He has occasional hot flash at night. His appetite is normal, he gained 4-5 pounds lately. He complains of mild to moderate low back pain and intermittent left ankle and knee pain. He was seen by his primary care physician, and may need to see orthopedic surgeon. He couldn't find a new job, still works as a Actor.  PAST MEDICAL HISTORY:  Past Medical History  Diagnosis  Date  . Anxiety   . Diverticulitis   . Gastroduodenitis   . Depression   . GERD (gastroesophageal reflux disease)     no meds for  . Blood in stool 5--31-2015  . Headache   . Hypertension      PAST SURGICAL HISTORY: Past Surgical History  Procedure Laterality Date  . Colonoscopy N/A 05/09/2013    Procedure: COLONOSCOPY;  Surgeon: Irene Shipper, MD;  Location: [facility] ENDOSCOPY;  Service:  Endoscopy;  Laterality: N/A;  . Esophagogastroduodenoscopy N/A 05/09/2013    Procedure: ESOPHAGOGASTRODUODENOSCOPY (EGD);  Surgeon: Irene Shipper, MD;  Location: [facility];  Service: Endoscopy;  Laterality: N/A;  possible egd depending on colon results  . Tonsillectomy  as child  . Laparoscopic appendectomy N/A 08/12/2013    Procedure: LAPAROSCOPIC ASSISTED APPENDECTOMY;  Surgeon: Adin Hector, MD;  Location: [facility] ORS;  Service: General;  Laterality: N/A;  . Bowel resection N/A 08/12/2013    Procedure: SMALL BOWEL ILEAL RESECTION;  Surgeon: Adin Hector, MD;  Location: [facility] ORS;  Service: General;  Laterality: N/A;     CURRENT MEDICATIONS: has a current medication list which includes the following prescription(s): celecoxib, cyclobenzaprine, losartan, naproxen, sertraline, trazodone, ibuprofen, and sumatriptan.   ALLERGIES: Review of patient's allergies indicates no known allergies.   SOCIAL HISTORY:  reports that he has been smoking Cigarettes.  He has a 5 pack-year smoking history. He has never used smokeless tobacco. He reports that he drinks alcohol. He reports that he uses illicit drugs (Marijuana).   FAMILY HISTORY: family history includes Anuerysm in his mother; Diabetes in his paternal aunt, paternal grandmother, and paternal uncle; Hypertension in his paternal aunt and paternal grandmother; Liver disease in his paternal uncle.   LABORATORY DATA:  CBC Latest Ref Rng 08/11/2014 07/14/2014 04/14/2014  WBC 4.0 - 10.3 10e3/uL 6.3 6.7 5.7  Hemoglobin 13.0 - 17.1 g/dL 13.5 14.3 13.0  Hematocrit 38.4 - 49.9 % 39.6 41.8 39.3  Platelets 140 - 400 10e3/uL 223 263.0 258    CMP Latest Ref Rng 08/13/2014 08/11/2014 07/14/2014  Glucose 70 - 99 mg/dL 99 114 82  BUN 6 - 23 mg/dL 11 14.8 11  Creatinine 0.40 - 1.50 mg/dL 1.23 1.2 1.26  Sodium 135 - 145 mEq/L 138 143 138  Potassium 3.5 - 5.1 mEq/L 4.0 4.0 4.3  Chloride 96 - 112 mEq/L 106 - 103  CO2 19 - 32 mEq/L _0 Calcium 8.4 - 10.5 mg/dL 9.3  9.0 9.5  Total Protein 6.4 - 8.3 g/dL - 6.7 7.0  Total Bilirubin 0.20 - 1.20 mg/dL - 0.68 0.3  Alkaline Phos 40 - 150 U/L - 65 73  AST 5 - 34 U/L - 19 17  ALT 0 - 55 U/L - 18 16   Chromogranin A  Status: Finalresult Visible to patient:  Not Released Nextappt: Today at 09:00 AM in Oncology Burr Medico, Krista Blue, MD) Dx:  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           Ref Range 7d ago  19moago  540mogo     Chromogranin A <=15 ng/mL 15 13CM <5R, CM          PATHOLOGY:  Diagnosis 1. Appendix, Other than Incidental - BENIGN APPENDICEAL TISSUE WITH REACTIVE LYMPHOID HYPERPLASIA AND FIBROUS OBLITERANS. - NO EVIDENCE OF CARCINOID TUMOR. - NO EVIDENCE OF ACTIVE INFLAMMATION OR NEOPLASIA. 2. Small intestine, resection for tumor, ileal with ileal mass, mesenteric mass - WELL DIFFERENTIATED NEUROENDOCRINE TUMOR (CARCINOID TUMOR), INVADING THROUGH  THE MUSCULARIS PROPRIA INTO SUBSEROSAL TISSUE. - ANGIOLYMPHATIC INVASION PRESENT. - THREE OF SIX LYMPH NODES, POSITIVE FOR CARCINOID TUMOR (3/6). - RESECTION MARGINS, NEGATIVE FOR ATYPIA OR MALIGNANCY. Microscopic Comment 2. 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 Histologic Grade : G1, Low grade Mitotic Rate < 2/10 high-power fields (HIGH POWER FIELD), Ki-67: <3% Microscopic Tumor Extension: Invading through the muscularis propria into subserosal tissue. Margins: Negative Proximal Margin: 8 cm Distal Margin: 19.5 cm Mesenteric (Radial) Margin : 10 cm from the mucosa lesion and 0.1 cm from the positive mesenteric lymph node, please see comment for details. Distance to the closest margin: please see comment for details.Lymph-Vascular Invasion: Present Perineural Invasion: Not identified Lymph nodes:  number examined 6; number positive: 3. TNM Staging: pT3, pN1 1 of 3 FINAL for SHELTON, SQUARE ([id number]) Microscopic Comment(continued) Additional findings: N/A Comments: The mucosal mass in the ileum measures 1.5 cm. The tumor cells invade through the muscularis propria into the subserosal tissue without penetration of the inked serosa.Immunohistochemical stains were performed and Ki-67 rate is less than 3%. 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 The mucosa tumor is 10 cm from the mesenteric resection margin. The tumor in the partially replaced lymph node is 0.1 cm from the inked mesenteric margin. The case was discussed with Dr. Neysa Bonito on 08/13/2013. (HCL:kh 08/13/13) Leslie Mcmillan MDPathologist, Electronic Signature(Case signed 08/14/2013)   REVIEW OF SYSTEMS:  Constitutional: Denies fevers, chills or abnormal weight loss Eyes: Denies blurriness of vision Ears, nose, mouth, throat, and face: Denies mucositis or sore throat Respiratory: Denies cough, dyspnea or wheezes Cardiovascular: Denies palpitation, chest discomfort or lower extremity swelling Gastrointestinal:  Denies nausea, heartburn; diarrhea as noted in HPI.  Skin: Denies abnormal skin rashes Lymphatics: Denies new lymphadenopathy or easy bruising Neurological:Denies numbness, tingling or new weaknesses Behavioral/Psych: Mood is stable, no new changes  All other systems were reviewed with the patient and are negative.  PHYSICAL EXAM:  height is _0  (1.778 m) and weight is 222 lb 14.4 oz (101.107 kg). His oral temperature is 98.2 F (36.8 C). His blood pressure is 127/71 and his pulse is 74. His respiration is 17 and oxygen saturation is 100%.    GENERAL:alert, no distress and comfortable; anxious, well developed and well nourished.  SKIN: skin color, texture, turgor are normal, no rashes or significant lesions EYES: normal,  Conjunctiva are pink and non-injected, sclera clear OROPHARYNX:no exudate, no erythema and lips, buccal mucosa, and tongue normal  NECK: supple, thyroid normal size, non-tender, without nodularity LYMPH:  no palpable lymphadenopathy in the cervical, axillary or inguinal LUNGS: clear to auscultation and percussion with normal breathing effort HEART: regular rate & rhythm and no murmurs and no lower extremity edema ABDOMEN:abdomen soft, mild tenderness at mid and left upper quadrant, normal bowel sounds; abdominal mid-line incision well healed; Musculoskeletal:no cyanosis of digits and no clubbing NEURO: alert & oriented x 3 with fluent speech, no focal motor/sensory deficits   RADIOGRAPHY:  MR ABDOMEN W WO CONTRAST 11/06/2013  CLINICAL DATA: Carcinoid tumor of ileum. Surgery 6/15. Intermittent abdominal pain. EXAM: MRI ABDOMEN WITHOUT AND WITH CONTRAST  TECHNIQUE: Multiplanar multisequence MR imaging of the abdomen was performed both before and after the administration of intravenous contrast.  CONTRAST: 87m MULTIHANCE GADOBENATE DIMEGLUMINE 529 MG/ML IV SOLN  18 cc MultiHance  COMPARISON: 05/30/2013 and CT of 05/05/2013.  FINDINGS: Normal heart size without pericardial or pleural effusion. A tiny cyst in the right lobe of the liver on image 28 of series 8. No suspicious liver lesions and no abnormal foci of arterial hyper enhancement. Normal spleen, stomach, pancreas, gallbladder, biliary tract, adrenal glands, kidneys. No abdominal adenopathy or ascites. Normal caliber of abdominal bowel loops. Small jejunal mesenteric nodes are likely reactive. No evidence omental/peritoneal disease. IMPRESSION: No acute process or evidence of metastatic disease within the abdomen. Electronically Signed  By: Abigail Miyamoto M.D. On: 11/06/2013 11:31  CT chest, abdomen and pelvis 05/04/2014 IMPRESSION: Status post distal ileal resection and appendectomy.  Small upper abdominal lymph nodes, including a 11 mm short  axis portacaval node. Additional small thoracic nodes measuring up to 7 mm. These findings are generally considered within the upper limits of normal.  Otherwise, no findings to suggest recurrent or metastatic disease.    IMPRESSION: Leslie Mcmillan is a 44 y.o. male with a history of LOW GRADE CARCINOID s/p resection on 08/12/2013.    PLAN:  1.  Carcinoid of the Ileum s/p resection, Stage III (pT3,pN1) -We reviewed the natural history of small bowel carcinoid tumor, and the possibility of later recurrence.  -We discussed surveillance plan. --Lab reviewed. His CBC, CMP and chromogranin and urine 5 HIAA levels are normal. I do not think he has disease recurrence. -I'll continue surveillance. -Repeat CT scan before next visit  2.  Chronic diarrhea -Likely secondary to surgery. Improved greatly. -Continue Imodium as needed -Avoid high-fiber diet  3. Chronic Headaches, Insomnia, fatigue -- follow up with his neurologist   4. Back, left knee and ankle pain -Follow up with his primary care physician  Follow-up: Return in 3 months with lab and CT abdomen and pelvis with IV contrast  All questions were answered. The patient knows to call the clinic with any problems, questions or concerns. We can certainly see the patient much sooner if necessary.  I spent 20 minutes counseling the patient face to face. The total time spent in the appointment was 25 minutes.   Truitt Merle  08/18/2014

## 2014-08-18 NOTE — Telephone Encounter (Signed)
per pof to sch pt appt-adv pt [facility] to call to sch scan-gave contrast-gave avs

## 2014-08-25 ENCOUNTER — Telehealth: Payer: Self-pay | Admitting: *Deleted

## 2014-08-25 NOTE — Telephone Encounter (Signed)
Pt signed ROI received via fax from [facility]. Forwarded to [place] to scan/email to medical records. JG/CMA

## 2014-09-17 ENCOUNTER — Ambulatory Visit: Payer: Self-pay | Admitting: Family Medicine

## 2014-10-12 ENCOUNTER — Ambulatory Visit: Payer: Self-pay | Admitting: Family Medicine

## 2014-10-14 ENCOUNTER — Telehealth: Payer: Self-pay | Admitting: Family Medicine

## 2014-10-14 NOTE — Telephone Encounter (Signed)
No charge based on insurance issues

## 2014-10-14 NOTE — Telephone Encounter (Signed)
Pt was no show 10/12/14 2:30pm, pt rescheduled 10/12/14 11:49am thru mychart stating that his insurance was not effective yet, charge for no show?

## 2014-10-27 ENCOUNTER — Ambulatory Visit: Payer: Self-pay | Admitting: Family Medicine

## 2014-10-29 ENCOUNTER — Ambulatory Visit: Payer: 59 | Admitting: Neurology

## 2014-11-12 ENCOUNTER — Other Ambulatory Visit: Payer: 59

## 2014-11-19 ENCOUNTER — Encounter: Payer: 59 | Admitting: Hematology

## 2014-11-19 ENCOUNTER — Other Ambulatory Visit: Payer: Self-pay | Admitting: *Deleted

## 2014-11-19 ENCOUNTER — Encounter: Payer: Self-pay | Admitting: Hematology

## 2014-11-19 NOTE — Progress Notes (Signed)
No show  This encounter was created in error - please disregard.

## 2014-11-20 ENCOUNTER — Telehealth: Payer: Self-pay | Admitting: Hematology

## 2014-11-20 NOTE — Telephone Encounter (Signed)
both numbers are out of service

## 2014-11-25 ENCOUNTER — Telehealth: Payer: Self-pay | Admitting: Family Medicine

## 2014-11-25 ENCOUNTER — Ambulatory Visit: Payer: Self-pay | Admitting: Family Medicine

## 2014-12-02 NOTE — Telephone Encounter (Signed)
Pt was no show 11/25/14 9:30am, my chart appt, pt has not rescheduled, charge for no show

## 2014-12-02 NOTE — Telephone Encounter (Signed)
Pt needs to be charged

## 2014-12-04 ENCOUNTER — Ambulatory Visit (HOSPITAL_COMMUNITY): Payer: Self-pay

## 2015-03-12 ENCOUNTER — Encounter: Payer: Self-pay | Admitting: Family Medicine

## 2015-03-12 ENCOUNTER — Ambulatory Visit (INDEPENDENT_AMBULATORY_CARE_PROVIDER_SITE_OTHER): Payer: BLUE CROSS/BLUE SHIELD | Admitting: Family Medicine

## 2015-03-12 VITALS — BP 128/77 | HR 84 | Temp 98.0°F | Resp 16 | Ht 70.0 in | Wt 224.8 lb

## 2015-03-12 DIAGNOSIS — Z23 Encounter for immunization: Secondary | ICD-10-CM

## 2015-03-12 DIAGNOSIS — E785 Hyperlipidemia, unspecified: Secondary | ICD-10-CM | POA: Insufficient documentation

## 2015-03-12 DIAGNOSIS — I10 Essential (primary) hypertension: Secondary | ICD-10-CM

## 2015-03-12 LAB — CBC WITH DIFFERENTIAL/PLATELET
BASOS ABS: 0 10*3/uL (ref 0.0–0.1)
Basophils Relative: 0.5 % (ref 0.0–3.0)
Eosinophils Absolute: 0.2 10*3/uL (ref 0.0–0.7)
Eosinophils Relative: 2.5 % (ref 0.0–5.0)
HEMATOCRIT: 42.6 % (ref 39.0–52.0)
HEMOGLOBIN: 14.1 g/dL (ref 13.0–17.0)
LYMPHS PCT: 34 % (ref 12.0–46.0)
Lymphs Abs: 3.2 10*3/uL (ref 0.7–4.0)
MCHC: 33 g/dL (ref 30.0–36.0)
MCV: 84.4 fl (ref 78.0–100.0)
MONOS PCT: 8.9 % (ref 3.0–12.0)
Monocytes Absolute: 0.8 10*3/uL (ref 0.1–1.0)
NEUTROS ABS: 5.1 10*3/uL (ref 1.4–7.7)
NEUTROS PCT: 54.1 % (ref 43.0–77.0)
Platelets: 269 10*3/uL (ref 150.0–400.0)
RBC: 5.05 Mil/uL (ref 4.22–5.81)
RDW: 14.4 % (ref 11.5–15.5)
WBC: 9.4 10*3/uL (ref 4.0–10.5)

## 2015-03-12 LAB — BASIC METABOLIC PANEL
BUN: 9 mg/dL (ref 6–23)
CALCIUM: 10 mg/dL (ref 8.4–10.5)
CO2: 31 mEq/L (ref 19–32)
Chloride: 107 mEq/L (ref 96–112)
Creatinine, Ser: 1.33 mg/dL (ref 0.40–1.50)
GFR: 75.19 mL/min (ref >60.00)
Glucose, Bld: 79 mg/dL (ref 70–99)
Potassium: 4.7 mEq/L (ref 3.5–5.1)
Sodium: 143 mEq/L (ref 135–145)

## 2015-03-12 LAB — LIPID PANEL
CHOL/HDL RATIO: 5
Cholesterol: 246 mg/dL — ABNORMAL HIGH (ref 0–200)
HDL: 51.3 mg/dL (ref >39.00)
LDL Cholesterol: 165 mg/dL — ABNORMAL HIGH (ref 0–99)
NONHDL: 194.96
Triglycerides: 152 mg/dL — ABNORMAL HIGH (ref 0.0–149.0)
VLDL: 30.4 mg/dL (ref 0.0–40.0)

## 2015-03-12 LAB — HEPATIC FUNCTION PANEL
ALT: 22 U/L (ref 0–53)
AST: 20 U/L (ref 0–37)
Albumin: 4.3 g/dL (ref 3.5–5.2)
Alkaline Phosphatase: 78 U/L (ref 39–117)
BILIRUBIN DIRECT: 0.1 mg/dL (ref 0.0–0.3)
TOTAL PROTEIN: 7.2 g/dL (ref 6.0–8.3)
Total Bilirubin: 0.5 mg/dL (ref 0.2–1.2)

## 2015-03-12 NOTE — Assessment & Plan Note (Signed)
BP is much better today w/ increased dose of Losartan.  Check BMP.  No anticipated med changes.  Stressed importance of smoking cessation, healthy diet, and regular exercise

## 2015-03-12 NOTE — Progress Notes (Signed)
Pre visit review using our clinic review tool, if applicable. No additional management support is needed unless otherwise documented below in the visit note. 

## 2015-03-12 NOTE — Patient Instructions (Signed)
Schedule your complete physical in 6 months We'll notify you of your lab results and make any changes if needed Continue to work on healthy diet and regular exercise- you can do it! Blood pressure looks great!!  No changes! Call with any questions or concerns If you want to join [place] at the new [place] office, any scheduled appointments will automatically transfer and we will see you at [address] [place] [address], [place], [place] [address] ([place]) Happy New Year!!!

## 2015-03-12 NOTE — Progress Notes (Signed)
   Subjective:    Patient ID: Leslie Mcmillan, male    DOB: 1970-12-22, 44 y.o.   MRN: [id number]  HPI HTN- chronic problem for pt.  At last visit, 7 months ago, pt's Losartan was increased to 100mg  daily.  BP is much improved today at 128/77.  No CP, SOB, HAs above baseline, visual changes, edema.  Hyperlipidemia- last LDL was noted to be 150.  Goal was to work on healthy diet and regular exercise to avoid medication.  Denies abd pain, N/V, myalgias.  Very active.   Review of Systems For ROS see HPI     Objective:   Physical Exam  Constitutional: He is oriented to person, place, and time. He appears well-developed and well-nourished. No distress.  HENT:  Head: Normocephalic and atraumatic.  Eyes: Conjunctivae and EOM are normal. Pupils are equal, round, and reactive to light.  Neck: Normal range of motion. Neck supple. No thyromegaly present.  Cardiovascular: Normal rate, regular rhythm, normal heart sounds and intact distal pulses.   No murmur heard. Pulmonary/Chest: Effort normal and breath sounds normal. No respiratory distress.  Abdominal: Soft. Bowel sounds are normal. He exhibits no distension.  Musculoskeletal: He exhibits no edema.  Lymphadenopathy:    He has no cervical adenopathy.  Neurological: He is alert and oriented to person, place, and time. No cranial nerve deficit.  Skin: Skin is warm and dry.  Psychiatric: He has a normal mood and affect. His behavior is normal.  Vitals reviewed.         Assessment & Plan:

## 2015-03-12 NOTE — Assessment & Plan Note (Signed)
New.  Pt's last LDL was 150.  Goal was to work on healthy diet and regular exercise x6 months to improve #s.  Check labs.  Start meds prn.

## 2015-03-16 ENCOUNTER — Other Ambulatory Visit: Payer: Self-pay | Admitting: Family Medicine

## 2015-03-16 MED ORDER — ATORVASTATIN CALCIUM 20 MG PO TABS: 20.0000 mg | ORAL_TABLET | Freq: Every day | ORAL | Status: AC

## 2015-03-22 ENCOUNTER — Encounter: Payer: Self-pay | Admitting: *Deleted

## 2015-03-22 NOTE — Addendum Note (Signed)
Addended by: Dorrene German on: 03/22/2015 02:08 PM   Modules accepted: Orders

## 2015-03-29 ENCOUNTER — Telehealth: Payer: Self-pay | Admitting: Hematology

## 2015-03-29 NOTE — Telephone Encounter (Signed)
Left message for patient to call the office back. He called 1/23 but did not give reason for call

## 2015-05-11 ENCOUNTER — Encounter (HOSPITAL_COMMUNITY): Payer: Self-pay

## 2015-05-11 ENCOUNTER — Emergency Department (HOSPITAL_COMMUNITY)
Admission: EM | Admit: 2015-05-11 | Discharge: 2015-05-12 | Disposition: A | Discharge status: Home / Self Care | Payer: BLUE CROSS/BLUE SHIELD | Attending: Emergency Medicine | Admitting: Emergency Medicine

## 2015-05-11 DIAGNOSIS — Z79899 Other long term (current) drug therapy: Secondary | ICD-10-CM | POA: Insufficient documentation

## 2015-05-11 DIAGNOSIS — F329 Major depressive disorder, single episode, unspecified: Secondary | ICD-10-CM | POA: Diagnosis not present

## 2015-05-11 DIAGNOSIS — N50811 Right testicular pain: Secondary | ICD-10-CM | POA: Insufficient documentation

## 2015-05-11 DIAGNOSIS — I10 Essential (primary) hypertension: Secondary | ICD-10-CM | POA: Insufficient documentation

## 2015-05-11 DIAGNOSIS — Z9049 Acquired absence of other specified parts of digestive tract: Secondary | ICD-10-CM | POA: Diagnosis not present

## 2015-05-11 DIAGNOSIS — Z791 Long term (current) use of non-steroidal anti-inflammatories (NSAID): Secondary | ICD-10-CM | POA: Diagnosis not present

## 2015-05-11 DIAGNOSIS — Z8719 Personal history of other diseases of the digestive system: Secondary | ICD-10-CM | POA: Insufficient documentation

## 2015-05-11 DIAGNOSIS — F17210 Nicotine dependence, cigarettes, uncomplicated: Secondary | ICD-10-CM | POA: Insufficient documentation

## 2015-05-11 DIAGNOSIS — F419 Anxiety disorder, unspecified: Secondary | ICD-10-CM | POA: Diagnosis not present

## 2015-05-11 DIAGNOSIS — N23 Unspecified renal colic: Secondary | ICD-10-CM | POA: Insufficient documentation

## 2015-05-11 DIAGNOSIS — N4889 Other specified disorders of penis: Secondary | ICD-10-CM | POA: Diagnosis present

## 2015-05-11 MED ORDER — HYDROMORPHONE HCL 1 MG/ML IJ SOLN
1.0000 mg | Freq: Once | INTRAMUSCULAR | Status: AC
  Administered 2015-05-12: 1 mg via INTRAVENOUS
  Filled 2015-05-11: qty 1

## 2015-05-11 MED ORDER — ONDANSETRON HCL 4 MG/2ML IJ SOLN
4.0000 mg | Freq: Once | INTRAMUSCULAR | Status: AC
  Administered 2015-05-12: 4 mg via INTRAVENOUS
  Filled 2015-05-11: qty 2

## 2015-05-11 NOTE — ED Provider Notes (Signed)
CSN: [id number]     Arrival date & time 05/11/15  2202 History   By signing my name below, I, Forrestine Him, attest that this documentation has been prepared under the direction and in the presence of Orpah Greek, MD.  Electronically Signed: Forrestine Him, ED Scribe. 05/11/2015. 11:56 PM.   Chief Complaint  Patient presents with  . Penis Pain   The history is provided by the patient. No language interpreter was used.    HPI Comments: Leslie Mcmillan is a 45 y.o. male with a PMHx of HTN who presents to the Emergency Department complaining of sudden onset, constant, ongoing, groin pain that radiates into the R testicle onset 10:00 PM this evening while resting.. Ongoing lower abdominal pain also reported. No aggravating or alleviating factors at this time. No OTC medications or home remedies attempted prior to arrival. No recent fever, chills, nausea, or vomiting. No prior history of kidney stones.   PCP: Annye Asa, MD    Past Medical History  Diagnosis Date  . Anxiety   . Diverticulitis   . Gastroduodenitis   . Depression   . GERD (gastroesophageal reflux disease)     no meds for  . Blood in stool 5--31-2015  . Headache   . Hypertension    Past Surgical History  Procedure Laterality Date  . Colonoscopy N/A 05/09/2013    Procedure: COLONOSCOPY;  Surgeon: Irene Shipper, MD;  Location: [facility];  Service: Endoscopy;  Laterality: N/A;  . Esophagogastroduodenoscopy N/A 05/09/2013    Procedure: ESOPHAGOGASTRODUODENOSCOPY (EGD);  Surgeon: Irene Shipper, MD;  Location: [facility];  Service: Endoscopy;  Laterality: N/A;  possible egd depending on colon results  . Tonsillectomy  as child  . Laparoscopic appendectomy N/A 08/12/2013    Procedure: LAPAROSCOPIC ASSISTED APPENDECTOMY;  Surgeon: Adin Hector, MD;  Location: [facility];  Service: General;  Laterality: N/A;  . Bowel resection N/A 08/12/2013    Procedure: SMALL BOWEL ILEAL RESECTION;  Surgeon: Adin Hector, MD;   Location: [facility];  Service: General;  Laterality: N/A;   Family History  Problem Relation Age of Onset  . Diabetes Paternal Grandmother   . Hypertension Paternal Grandmother   . Diabetes Paternal Uncle   . Diabetes Paternal Aunt   . Liver disease Paternal Uncle   . Hypertension Paternal Aunt   . Anuerysm Mother     brain   Social History  Substance Use Topics  . Smoking status: Current Every Day Smoker -- 0.50 packs/day for 10 years    Types: Cigarettes  . Smokeless tobacco: Never Used  . Alcohol Use: 0.0 oz/week    0 Standard drinks or equivalent per week     Comment: 2-3 beers some days    Review of Systems  Constitutional: Negative for fever and chills.  Respiratory: Negative for shortness of breath.   Cardiovascular: Negative for chest pain.  Gastrointestinal: Positive for abdominal pain. Negative for nausea and vomiting.  Genitourinary: Positive for penile pain and testicular pain.  Neurological: Negative for headaches.  Psychiatric/Behavioral: Negative for confusion.  All other systems reviewed and are negative.     Allergies  Review of patient's allergies indicates no known allergies.  Home Medications   Prior to Admission medications   Medication Sig Start Date End Date Taking? Authorizing Provider  atorvastatin (LIPITOR) 20 MG tablet Take 1 tablet (20 mg total) by mouth at bedtime. 03/16/15  Yes [name], MD  celecoxib (CELEBREX) 200 MG capsule Take 1 capsule (200 mg  total) by mouth 2 (two) times daily. 08/13/14  Yes [name], MD  losartan (COZAAR) 100 MG tablet Take 1 tablet (100 mg total) by mouth daily. 08/13/14  Yes [name], MD  sertraline (ZOLOFT) 50 MG tablet Take 2 tablets (100 mg total) by mouth daily. 04/14/14  Yes Pieter Partridge, DO  traZODone (DESYREL) 50 MG tablet Take 0.5-1 tablets (25-50 mg total) by mouth at bedtime as needed for sleep. 07/14/14  Yes [name], MD  cyclobenzaprine (FLEXERIL) 10 MG tablet Take 1  tablet (10 mg total) by mouth 3 (three) times daily as needed for muscle spasms. Patient not taking: Reported on 05/11/2015 08/13/14   [name], MD  oxyCODONE-acetaminophen (PERCOCET) 5-325 MG tablet Take 1-2 tablets by mouth every 4 (four) hours as needed. 05/12/15   Orpah Greek, MD  tamsulosin (FLOMAX) 0.4 MG CAPS capsule Take 1 capsule (0.4 mg total) by mouth daily. 05/12/15   Orpah Greek, MD   Triage Vitals: BP 136/77 mmHg  Pulse 68  Temp(Src) 97.5 F (36.4 C) (Oral)  Resp 16  Ht 5\' 9"  (1.753 m)  Wt 224 lb (101.606 kg)  BMI 33.06 kg/m2  SpO2 97%   Physical Exam  Constitutional: He is oriented to person, place, and time. He appears well-developed and well-nourished. He appears distressed.  HENT:  Head: Normocephalic and atraumatic.  Right Ear: Hearing normal.  Left Ear: Hearing normal.  Nose: Nose normal.  Mouth/Throat: Oropharynx is clear and moist and mucous membranes are normal.  Eyes: Conjunctivae and EOM are normal. Pupils are equal, round, and reactive to light.  Neck: Normal range of motion. Neck supple.  Cardiovascular: Regular rhythm, S1 normal and S2 normal.  Exam reveals no gallop and no friction rub.   No murmur heard. Pulmonary/Chest: Effort normal and breath sounds normal. No respiratory distress. He exhibits no tenderness.  Abdominal: Soft. Normal appearance and bowel sounds are normal. There is no hepatosplenomegaly. There is no tenderness. There is no rebound, no guarding, no tenderness at McBurney's point and negative Murphy's sign. No hernia.  Musculoskeletal: Normal range of motion.  Neurological: He is alert and oriented to person, place, and time. He has normal strength. No cranial nerve deficit or sensory deficit. Coordination normal. GCS eye subscore is 4. GCS verbal subscore is 5. GCS motor subscore is 6.  Skin: Skin is warm, dry and intact. No rash noted. No cyanosis.  Psychiatric: He has a normal mood and affect. His speech is normal  and behavior is normal. Thought content normal.  Nursing note and vitals reviewed.   ED Course  Procedures (including critical care time)  DIAGNOSTIC STUDIES: Oxygen Saturation is 100% on RA, Normal by my interpretation.    COORDINATION OF CARE: 11:53 PM- Will order CT renal stone study, CBC, BMP, and urinalysis. Discussed treatment plan with pt at bedside and pt agreed to plan.     Labs Review Labs Reviewed  CBC WITH DIFFERENTIAL/PLATELET - Abnormal; Notable for the following:    WBC 11.3 (*)    HCT 38.5 (*)    All other components within normal limits  BASIC METABOLIC PANEL - Abnormal; Notable for the following:    Glucose, Bld 117 (*)    Creatinine, Ser 1.49 (*)    GFR calc non Af Amer 56 (*)    All other components within normal limits  URINALYSIS, ROUTINE W REFLEX MICROSCOPIC (NOT AT [facility])    Imaging Review Ct Renal Stone Study  05/12/2015  CLINICAL DATA:  Left flank pain. Left groin pain radiating to the left testicle. EXAM: CT ABDOMEN AND PELVIS WITHOUT CONTRAST TECHNIQUE: Multidetector CT imaging of the abdomen and pelvis was performed following the standard protocol without IV contrast. COMPARISON:  04/24/2014 FINDINGS: Atelectasis in the lung bases. There is a tiny stone in the left ureterovesical junction measuring about 2 mm diameter. There is mild to moderate proximal hydronephrosis and hydroureter with mild stranding around the left ureter. Additional punctate size stones in the right kidney without evidence of hydronephrosis or hydroureter. Bladder wall is not thickened and no bladder stones are identified. The unenhanced appearance of the liver, spleen, gallbladder, pancreas, adrenal glands, abdominal aorta, inferior vena cava, and retroperitoneal lymph nodes is unremarkable. Stomach, small bowel, and colon are not abnormally distended. Postoperative changes at the ileocolonic junction. No free air or free fluid in the abdomen. Small umbilical hernia containing fat.  Pelvis: Prostate gland is not enlarged. No free or loculated pelvic fluid collections. No pelvic mass or lymphadenopathy. Appendix is surgically absent. No destructive bone lesions. Degenerative changes in spine. IMPRESSION: 2 mm stone in the distal left ureter with moderate proximal obstruction. Additional tiny intrarenal nonobstructing stones on the right. Electronically Signed   By: Lucienne Capers M.D.   On: 05/12/2015 00:44   I have personally reviewed and evaluated these images and lab results as part of my medical decision-making.   EKG Interpretation None      MDM   Final diagnoses:  Renal colic on left side    Patient presents to the ER for evaluation of left flank pain. Patient reports that pain suddenly began prior to arrival in the [facility] reveals small distal kidney stone.  Patient has had excellent pain control with analgesia provided here in the ER. He will continue outpatient analgesia and follow-up with urology.  I personally performed the services described in this documentation, which was scribed in my presence. The recorded information has been reviewed and is accurate.   Orpah Greek, MD 05/12/15 [id number]

## 2015-05-11 NOTE — ED Notes (Signed)
Pt complains of penis pain that radiates up through his right testicle into his lower stomach, he states it acutely started around 10 pm, no injury

## 2015-05-12 ENCOUNTER — Emergency Department (HOSPITAL_COMMUNITY): Payer: BLUE CROSS/BLUE SHIELD

## 2015-05-12 LAB — CBC WITH DIFFERENTIAL/PLATELET
BASOS ABS: 0 10*3/uL (ref 0.0–0.1)
Basophils Relative: 0 %
Eosinophils Absolute: 0.2 10*3/uL (ref 0.0–0.7)
Eosinophils Relative: 1 %
HEMATOCRIT: 38.5 % — AB (ref 39.0–52.0)
HEMOGLOBIN: 13 g/dL (ref 13.0–17.0)
LYMPHS PCT: 25 %
Lymphs Abs: 2.8 10*3/uL (ref 0.7–4.0)
MCH: 28.3 pg (ref 26.0–34.0)
MCHC: 33.8 g/dL (ref 30.0–36.0)
MCV: 83.9 fL (ref 78.0–100.0)
MONO ABS: 1 10*3/uL (ref 0.1–1.0)
MONOS PCT: 9 %
NEUTROS ABS: 7.3 10*3/uL (ref 1.7–7.7)
Neutrophils Relative %: 65 %
Platelets: 236 10*3/uL (ref 150–400)
RBC: 4.59 MIL/uL (ref 4.22–5.81)
RDW: 13.5 % (ref 11.5–15.5)
WBC: 11.3 10*3/uL — ABNORMAL HIGH (ref 4.0–10.5)

## 2015-05-12 LAB — BASIC METABOLIC PANEL
Anion gap: 12 (ref 5–15)
BUN: 16 mg/dL (ref 6–20)
CHLORIDE: 105 mmol/L (ref 101–111)
CO2: 25 mmol/L (ref 22–32)
CREATININE: 1.49 mg/dL — AB (ref 0.61–1.24)
Calcium: 9.6 mg/dL (ref 8.9–10.3)
GFR calc Af Amer: >60 mL/min (ref >60)
GFR calc non Af Amer: 56 mL/min — ABNORMAL LOW (ref >60)
Glucose, Bld: 117 mg/dL — ABNORMAL HIGH (ref 65–99)
Potassium: 4.1 mmol/L (ref 3.5–5.1)
Sodium: 142 mmol/L (ref 135–145)

## 2015-05-12 MED ORDER — KETOROLAC TROMETHAMINE 30 MG/ML IJ SOLN
30.0000 mg | Freq: Once | INTRAMUSCULAR | Status: AC
  Administered 2015-05-12: 30 mg via INTRAVENOUS
  Filled 2015-05-12: qty 1

## 2015-05-12 MED ORDER — TAMSULOSIN HCL 0.4 MG PO CAPS: 0.4000 mg | ORAL_CAPSULE | Freq: Every day | ORAL | Status: DC

## 2015-05-12 MED ORDER — OXYCODONE-ACETAMINOPHEN 5-325 MG PO TABS: 1.0000 | ORAL_TABLET | ORAL | Status: DC | PRN

## 2015-05-12 NOTE — Discharge Instructions (Signed)
Kidney Stones °Kidney stones (urolithiasis) are deposits that form inside your kidneys. The intense pain is caused by the stone moving through the urinary tract. When the stone moves, the ureter goes into spasm around the stone. The stone is usually passed in the urine.  °CAUSES  °· A disorder that makes certain neck glands produce too much parathyroid hormone (primary hyperparathyroidism). °· A buildup of uric acid crystals, similar to gout in your joints. °· Narrowing (stricture) of the ureter. °· A kidney obstruction present at birth (congenital obstruction). °· Previous surgery on the kidney or ureters. °· Numerous kidney infections. °SYMPTOMS  °· Feeling sick to your stomach (nauseous). °· Throwing up (vomiting). °· Blood in the urine (hematuria). °· Pain that usually spreads (radiates) to the groin. °· Frequency or urgency of urination. °DIAGNOSIS  °· Taking a history and physical exam. °· Blood or urine tests. °· CT scan. °· Occasionally, an examination of the inside of the urinary bladder (cystoscopy) is performed. °TREATMENT  °· Observation. °· Increasing your fluid intake. °· Extracorporeal shock wave lithotripsy--This is a noninvasive procedure that uses shock waves to break up kidney stones. °· Surgery may be needed if you have severe pain or persistent obstruction. There are various surgical procedures. Most of the procedures are performed with the use of small instruments. Only small incisions are needed to accommodate these instruments, so recovery time is minimized. °The size, location, and chemical composition are all important variables that will determine the proper choice of action for you. Talk to your health care provider to better understand your situation so that you will minimize the risk of injury to yourself and your kidney.  °HOME CARE INSTRUCTIONS  °· Drink enough water and fluids to keep your urine clear or pale yellow. This will help you to pass the stone or stone fragments. °· Strain  all urine through the provided strainer. Keep all particulate matter and stones for your health care provider to see. The stone causing the pain may be as small as a grain of salt. It is very important to use the strainer each and every time you pass your urine. The collection of your stone will allow your health care provider to analyze it and verify that a stone has actually passed. The stone analysis will often identify what you can do to reduce the incidence of recurrences. °· Only take over-the-counter or prescription medicines for pain, discomfort, or fever as directed by your health care provider. °· Keep all follow-up visits as told by your health care provider. This is important. °· Get follow-up X-rays if required. The absence of pain does not always mean that the stone has passed. It may have only stopped moving. If the urine remains completely obstructed, it can cause loss of kidney function or even complete destruction of the kidney. It is your responsibility to make sure X-rays and follow-ups are completed. Ultrasounds of the kidney can show blockages and the status of the kidney. Ultrasounds are not associated with any radiation and can be performed easily in a matter of minutes. °· Make changes to your daily diet as told by your health care provider. You may be told to: °¨ Limit the amount of salt that you eat. °¨ Eat 5 or more servings of fruits and vegetables each day. °¨ Limit the amount of meat, poultry, fish, and eggs that you eat. °· Collect a 24-hour urine sample as told by your health care provider. You may need to collect another urine sample every 6-12   months. °SEEK MEDICAL CARE IF: °· You experience pain that is progressive and unresponsive to any pain medicine you have been prescribed. °SEEK IMMEDIATE MEDICAL CARE IF:  °· Pain cannot be controlled with the prescribed medicine. °· You have a fever or shaking chills. °· The severity or intensity of pain increases over 18 hours and is not  relieved by pain medicine. °· You develop a new onset of abdominal pain. °· You feel faint or pass out. °· You are unable to urinate. °  °This information is not intended to replace advice given to you by your health care provider. Make sure you discuss any questions you have with your health care provider. °  °Document Released: 02/20/2005 Document Revised: 11/11/2014 Document Reviewed: 07/24/2012 °Elsevier Interactive Patient Education ©2016 [employer] ° °

## 2015-05-12 NOTE — ED Notes (Signed)
Patient transported to CT 

## 2015-05-12 NOTE — ED Notes (Signed)
Patient was alert, oriented and stable upon discharge. RN went over AVS and patient had no further questions.  

## 2015-07-23 ENCOUNTER — Telehealth: Payer: Self-pay | Admitting: Hematology

## 2015-07-23 NOTE — Telephone Encounter (Signed)
Pt came in to resched apts

## 2015-07-27 ENCOUNTER — Other Ambulatory Visit: Payer: Self-pay | Admitting: Hematology

## 2015-07-27 DIAGNOSIS — D3A012 Benign carcinoid tumor of the ileum: Secondary | ICD-10-CM

## 2015-07-28 ENCOUNTER — Telehealth: Payer: Self-pay | Admitting: Hematology

## 2015-07-28 ENCOUNTER — Encounter: Payer: Self-pay | Admitting: Hematology

## 2015-07-28 ENCOUNTER — Other Ambulatory Visit (HOSPITAL_BASED_OUTPATIENT_CLINIC_OR_DEPARTMENT_OTHER): Payer: BLUE CROSS/BLUE SHIELD

## 2015-07-28 ENCOUNTER — Ambulatory Visit (HOSPITAL_BASED_OUTPATIENT_CLINIC_OR_DEPARTMENT_OTHER): Payer: BLUE CROSS/BLUE SHIELD | Admitting: Hematology

## 2015-07-28 VITALS — BP 131/70 | HR 75 | Temp 98.2°F | Resp 18 | Ht 69.0 in | Wt 230.5 lb

## 2015-07-28 DIAGNOSIS — D3A012 Benign carcinoid tumor of the ileum: Secondary | ICD-10-CM

## 2015-07-28 DIAGNOSIS — R197 Diarrhea, unspecified: Secondary | ICD-10-CM

## 2015-07-28 DIAGNOSIS — R109 Unspecified abdominal pain: Secondary | ICD-10-CM

## 2015-07-28 DIAGNOSIS — C7A012 Malignant carcinoid tumor of the ileum: Secondary | ICD-10-CM

## 2015-07-28 LAB — CBC WITH DIFFERENTIAL/PLATELET
BASO%: 0.8 % (ref 0.0–2.0)
Basophils Absolute: 0.1 10e3/uL (ref 0.0–0.1)
EOS%: 4 % (ref 0.0–7.0)
Eosinophils Absolute: 0.3 10e3/uL (ref 0.0–0.5)
HCT: 40.7 % (ref 38.4–49.9)
HGB: 13.5 g/dL (ref 13.0–17.1)
LYMPH%: 40.5 % (ref 14.0–49.0)
MCH: 27.6 pg (ref 27.2–33.4)
MCHC: 33 g/dL (ref 32.0–36.0)
MCV: 83.4 fL (ref 79.3–98.0)
MONO#: 0.7 10e3/uL (ref 0.1–0.9)
MONO%: 9 % (ref 0.0–14.0)
NEUT#: 3.5 10e3/uL (ref 1.5–6.5)
NEUT%: 45.7 % (ref 39.0–75.0)
Platelets: 228 10e3/uL (ref 140–400)
RBC: 4.89 10e6/uL (ref 4.20–5.82)
RDW: 14.1 % (ref 11.0–14.6)
WBC: 7.6 10e3/uL (ref 4.0–10.3)
lymph#: 3.1 10e3/uL (ref 0.9–3.3)

## 2015-07-28 LAB — COMPREHENSIVE METABOLIC PANEL WITH GFR
ALT: 21 U/L (ref 0–55)
AST: 17 U/L (ref 5–34)
Albumin: 3.6 g/dL (ref 3.5–5.0)
Alkaline Phosphatase: 71 U/L (ref 40–150)
Anion Gap: 10 meq/L (ref 3–11)
BUN: 10 mg/dL (ref 7.0–26.0)
CO2: 24 meq/L (ref 22–29)
Calcium: 9.1 mg/dL (ref 8.4–10.4)
Chloride: 108 meq/L (ref 98–109)
Creatinine: 1.2 mg/dL (ref 0.7–1.3)
EGFR: 86 ml/min/1.73 m2 — ABNORMAL LOW
Glucose: 103 mg/dL (ref 70–140)
Potassium: 3.9 meq/L (ref 3.5–5.1)
Sodium: 142 meq/L (ref 136–145)
Total Bilirubin: 0.41 mg/dL (ref 0.20–1.20)
Total Protein: 6.6 g/dL (ref 6.4–8.3)

## 2015-07-28 NOTE — Telephone Encounter (Signed)
per pof to sch pt appt-gave pt copy of av °

## 2015-07-28 NOTE — Progress Notes (Signed)
[place] Telephone:([phone]) [phone]   Fax:(336) [phone]  ONCOLOGY FOLLOW UP NOTE   Name: Leslie Mcmillan Date: 07/28/2015 MRN: [id number] DOB: [birth year]  PCP: Annye Asa, MD   REFERRING PHYSICIAN: Michael Boston, MD  PATIENT IDENTIFICATION: [name]  HISTORY OF PRESENT ILLNESS:Leslie Mcmillan is a 46 y.o. 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 Johney Maine.     He reports that in winter of 2012, he abdominal pains described as tense muscle spasms, sharp pains that were severe in quality.  He went to the emergency room at [facility] and he reports that he was told that he inflammation and provided antibiotics and ibuprofen. CT of abdomen on 10/25/2010 showed that there was mild inflammation surrounding the proximal jejunum near the ligament of Treitz, findings which could represent focal enteritis. He had later complained of some severe headaches causing an emergency room visit [facility] ED and he had a CT of head in January 2015 which showed no acute intracranial abnormality.   He notes that there was slow progression of his abdominal pain over time.  Eventually, due to it recurrence and progression, he presented to [facility] ER and CT of abdomen and pelvis was unrevealing on 05/05/2013.  Due to progressive abdominal pain and rectal bleeding, he was referred to [facility] (Dr. Hilarie Fredrickson).  He had a Meckels scan on 05/22/2013  and MRI (05/30/2013) and capsule endoscopy.  Meckel's scan was without scintigraphic evidence of ectopic gastric mucosa and demonstrated that there was a subtle round soft tissue mass within the right lower quadrant which was difficult to distinguish from the adjacent loops of small bowel. This small mass measured 2.1 x 2.4 cm on 10/25/2010 increased slowly to 2.8 x 2.7 cm on CT of 05/05/2013. Common differential for this small slow growing lesion would be a  carcinoid tumor or a gastrointestinal stromal tumor. No retractile pattern typical of carcinoid.    MRI of abdomen and pelvis on 05/30/2013 showed a diffusely enhacing 3.0 x 2.0 x 2.2 cm mass in the  new mesentery with close associated mesenteric vessels but the lesion did not appear to invade the small bowel.  As noted on the Mecke's can, top differential diagnostic consideration would be GIST or carcinoid tumor.  Lymphoma was not completely excluded. Of note, he also had several jejunal loops that were somewhat featureless which could be due to fold thickening or effacement.   Based on results of these studies, he was referred to to Surgery (Dr. Johney Maine).  He was admitted for mesenteric mass and present carcinoid and underwent laparoscopic assisted resection of ileum and ymph nodes on 08/12/2013.  There were no complications.  His pathology noted carcinoid with 3/6 lymph nodes positive.  Medical oncology consultation was obtained and patient seen by Dr Juliann Mule on 09/04/2013.     Current treatment: Observation  INTERVAL HISTORY: He returns for follow up. He lost his insurance last year and did not follow-up. He has new insurance now. He is doing well overall, still has loose bowel movement 1-3 times a day, intermittent abdominal cramps, which seems to be more frequent lately, mainly on the right side, lasts a few seconds to minutes, no nausea, abdominal bloating, flushing, or other new symptoms. He is also been struggling with his PTSD, for which he sees a psychiatrist at the [place]. He had episode of renal stone in March 2017, was seen at  the ED, and to see urologist once. He passed one stone.  PAST MEDICAL HISTORY:  Past Medical History  Diagnosis Date  . Anxiety   . Diverticulitis   . Gastroduodenitis   . Depression   . GERD (gastroesophageal reflux disease)     no meds for  . Blood in stool 5--31-2015  . Headache   . Hypertension      PAST SURGICAL HISTORY: Past Surgical History  Procedure  Laterality Date  . Colonoscopy N/A 05/09/2013    Procedure: COLONOSCOPY;  Surgeon: Irene Shipper, MD;  Location: [facility] ENDOSCOPY;  Service: Endoscopy;  Laterality: N/A;  . Esophagogastroduodenoscopy N/A 05/09/2013    Procedure: ESOPHAGOGASTRODUODENOSCOPY (EGD);  Surgeon: Irene Shipper, MD;  Location: [facility];  Service: Endoscopy;  Laterality: N/A;  possible egd depending on colon results  . Tonsillectomy  as child  . Laparoscopic appendectomy N/A 08/12/2013    Procedure: LAPAROSCOPIC ASSISTED APPENDECTOMY;  Surgeon: Adin Hector, MD;  Location: [facility] ORS;  Service: General;  Laterality: N/A;  . Bowel resection N/A 08/12/2013    Procedure: SMALL BOWEL ILEAL RESECTION;  Surgeon: Adin Hector, MD;  Location: [facility] ORS;  Service: General;  Laterality: N/A;     CURRENT MEDICATIONS: has a current medication list which includes the following prescription(s): atorvastatin, celecoxib, cyclobenzaprine, losartan, sertraline, tamsulosin, and trazodone.   ALLERGIES: Review of patient's allergies indicates no known allergies.   SOCIAL HISTORY:  reports that he has been smoking Cigarettes.  He has a 5 pack-year smoking history. He has never used smokeless tobacco. He reports that he drinks alcohol. He reports that he uses illicit drugs (Marijuana).   FAMILY HISTORY: family history includes Anuerysm in his mother; Diabetes in his paternal aunt, paternal grandmother, and paternal uncle; Hypertension in his paternal aunt and paternal grandmother; Liver disease in his paternal uncle.   LABORATORY DATA:  CBC Latest Ref Rng 07/28/2015 05/12/2015 03/12/2015  WBC 4.0 - 10.3 10e3/uL 7.6 11.3(H) 9.4  Hemoglobin 13.0 - 17.1 g/dL 13.5 13.0 14.1  Hematocrit 38.4 - 49.9 % 40.7 38.5(L) 42.6  Platelets 140 - 400 10e3/uL 228 236 269.0    CMP Latest Ref Rng 07/28/2015 05/12/2015 03/12/2015  Glucose 70 - 140 mg/dl 103 117(H) 79  BUN 7.0 - 26.0 mg/dL 10.0 16 9  Creatinine 0.7 - 1.3 mg/dL 1.2 1.49(H) 1.33  Sodium 136 - 145 mEq/L 142  142 143  Potassium 3.5 - 5.1 mEq/L 3.9 4.1 4.7  Chloride 101 - 111 mmol/L - 105 107  CO2 22 - 29 mEq/L '24 25 31  ' Calcium 8.4 - 10.4 mg/dL 9.1 9.6 10.0  Total Protein 6.4 - 8.3 g/dL 6.6 - 7.2  Total Bilirubin 0.20 - 1.20 mg/dL 0.41 - 0.5  Alkaline Phos 40 - 150 U/L 71 - 78  AST 5 - 34 U/L 17 - 20  ALT 0 - 55 U/L 21 - 22   Chromogranin A  Status: Finalresult Visible to patient:  Not Released Nextappt: Today at 09:00 AM in Oncology Burr Medico, Krista Blue, MD) Dx:  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           Ref Range 7d ago  73moago  576mogo     Chromogranin A <=15 ng/mL 15 13CM <5R, CM          PATHOLOGY:  Diagnosis 1. Appendix, Other than Incidental - BENIGN APPENDICEAL TISSUE WITH REACTIVE LYMPHOID HYPERPLASIA AND FIBROUS OBLITERANS. - NO EVIDENCE OF CARCINOID TUMOR. - NO EVIDENCE OF ACTIVE INFLAMMATION OR NEOPLASIA. 2.  Small intestine, resection for tumor, ileal with ileal mass, mesenteric mass - WELL DIFFERENTIATED NEUROENDOCRINE TUMOR (CARCINOID TUMOR), INVADING THROUGH THE MUSCULARIS PROPRIA INTO SUBSEROSAL TISSUE. - ANGIOLYMPHATIC INVASION PRESENT. - THREE OF SIX LYMPH NODES, POSITIVE FOR CARCINOID TUMOR (3/6). - RESECTION MARGINS, NEGATIVE FOR ATYPIA OR MALIGNANCY. Microscopic Comment 2. 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 Histologic Grade : G1, Low grade Mitotic Rate < 2/10 high-power fields (HIGH POWER FIELD), Ki-67: <3% Microscopic Tumor Extension: Invading through the muscularis propria into subserosal tissue. Margins: Negative Proximal Margin: 8 cm Distal Margin: 19.5 cm Mesenteric (Radial) Margin : 10 cm from the mucosa lesion and 0.1 cm from the positive mesenteric lymph node, please see comment for  details. Distance to the closest margin: please see comment for details.Lymph-Vascular Invasion: Present Perineural Invasion: Not identified Lymph nodes: number examined 6; number positive: 3. TNM Staging: pT3, pN1 1 of 3 FINAL for Leslie Mcmillan, Leslie Mcmillan ([id number]) Microscopic Comment(continued) Additional findings: N/A Comments: The mucosal mass in the ileum measures 1.5 cm. The tumor cells invade through the muscularis propria into the subserosal tissue without penetration of the inked serosa.Immunohistochemical stains were performed and Ki-67 rate is less than 3%. 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 The mucosa tumor is 10 cm from the mesenteric resection margin. The tumor in the partially replaced lymph node is 0.1 cm from the inked mesenteric margin. The case was discussed with Dr. Neysa Bonito on 08/13/2013. (HCL:kh 08/13/13) H. CATHERINE LI MDPathologist, Electronic Signature(Case signed 08/14/2013)   REVIEW OF SYSTEMS:  Constitutional: Denies fevers, chills or abnormal weight loss Eyes: Denies blurriness of vision Ears, nose, mouth, throat, and face: Denies mucositis or sore throat Respiratory: Denies cough, dyspnea or wheezes Cardiovascular: Denies palpitation, chest discomfort or lower extremity swelling Gastrointestinal:  Denies nausea, heartburn; diarrhea as noted in HPI.  Skin: Denies abnormal skin rashes Lymphatics: Denies new lymphadenopathy or easy bruising Neurological:Denies numbness, tingling or new weaknesses Behavioral/Psych: Mood is stable, no new changes  All other systems were reviewed with the patient and are negative.  PHYSICAL EXAM:  height is '5\' 9"'  (1.753 m) and weight is 230 lb 8 oz (104.554 kg). His oral temperature is 98.2 F (36.8 C). His blood pressure is 131/70 and his pulse is 75. His respiration is 18 and oxygen saturation is 95%.    GENERAL:alert, no distress and  comfortable; anxious, well developed and well nourished.  SKIN: skin color, texture, turgor are normal, no rashes or significant lesions EYES: normal, Conjunctiva are pink and non-injected, sclera clear OROPHARYNX:no exudate, no erythema and lips, buccal mucosa, and tongue normal  NECK: supple, thyroid normal size, non-tender, without nodularity LYMPH:  no palpable lymphadenopathy in the cervical, axillary or inguinal LUNGS: clear to auscultation and percussion with normal breathing effort HEART: regular rate & rhythm and no murmurs and no lower extremity edema ABDOMEN:abdomen soft, mild tenderness at mid and left upper quadrant, normal bowel sounds; abdominal mid-line incision well healed; Musculoskeletal:no cyanosis of digits and no clubbing NEURO: alert & oriented x 3 with fluent speech, no focal motor/sensory deficits   RADIOGRAPHY:  MR ABDOMEN W WO CONTRAST 11/06/2013  CLINICAL DATA: Carcinoid tumor of ileum. Surgery 6/15. Intermittent abdominal pain. EXAM: MRI ABDOMEN WITHOUT AND WITH CONTRAST  TECHNIQUE: Multiplanar multisequence MR imaging of the abdomen was  performed both before and after the administration of intravenous contrast.  CONTRAST: 59m MULTIHANCE GADOBENATE DIMEGLUMINE 529 MG/ML IV SOLN 18 cc MultiHance  COMPARISON: 05/30/2013 and CT of 05/05/2013.  FINDINGS: Normal heart size without pericardial or pleural effusion. A tiny cyst in the right lobe of the liver on image 28 of series 8. No suspicious liver lesions and no abnormal foci of arterial hyper enhancement. Normal spleen, stomach, pancreas, gallbladder, biliary tract, adrenal glands, kidneys. No abdominal adenopathy or ascites. Normal caliber of abdominal bowel loops. Small jejunal mesenteric nodes are likely reactive. No evidence omental/peritoneal disease. IMPRESSION: No acute process or evidence of metastatic disease within the abdomen. Electronically Signed  By: KAbigail MiyamotoM.D. On: 11/06/2013 11:31  CT renal stone  study 05/12/2015 IMPRESSION: 2 mm stone in the distal left ureter with moderate proximal obstruction. Additional tiny intrarenal nonobstructing stones on the right.     IMPRESSION: Leslie Mcmillan a 45y.o. male with a history of LOW GRADE CARCINOID s/p resection on 08/12/2013.    PLAN:  1.  Carcinoid of the Ileum s/p resection, Stage III (pT3,pN1) -We reviewed the natural history of small bowel carcinoid tumor, and the possibility of later recurrence.  -We discussed surveillance plan. -His clinically doing well, still has mild diarrhea and occasional abdominal cramps, recent CT scan of abdomen (renal stone protocol) was negative for recurrence. --Lab reviewed. His CBC, CMP are normal, chromogranin is still pending. I do not think he has disease recurrence. -I'll continue surveillance. -Repeat CT scan in 8 months   2.  Chronic diarrhea, abdominal cramps  -Likely secondary to surgery.  -I encouraged him to use Imodium as needed -Avoid high-fiber diet  3. PTSD, chronic Headaches, Insomnia, fatigue -- [facility]  Follow-up: Return in 4 months with lab, I will order restaging CT on next visit   All questions were answered. The patient knows to call the clinic with any problems, questions or concerns. We can certainly see the patient much sooner if necessary.  I spent 20 minutes counseling the patient face to face. The total time spent in the appointment was 25 minutes.   FTruitt Merle 07/28/2015

## 2015-07-29 LAB — CHROMOGRANIN A: Chromogranin A: 1 nmol/L (ref 0–5)

## 2015-07-31 LAB — CHROMOGRANIN A (PARALLEL TESTING): CHROMOGRANIN A: 15 ng/mL (ref <=15)

## 2015-09-04 IMAGING — CT CT ABD-PELV W/ CM
2 of 5 series · 17 of 46 positions shown, 19 images · IV contrast (CONTRAST)
Comparison: None.

CLINICAL DATA: GI bleed and lower abdominal pain.

EXAM:
CT ABDOMEN AND PELVIS WITH CONTRAST
TECHNIQUE: Multidetector CT imaging of the abdomen and pelvis was performed
using the standard protocol following bolus administration of
intravenous contrast.
CONTRAST:  100mL OMNIPAQUE IOHEXOL 300 MG/ML  SOLN

[Series 2: routine · axial · 0.68mm/px · z∈[+21,+456]mm · 14 of 97 slices shown, 16 images]
[im 5/97  soft-tissue]
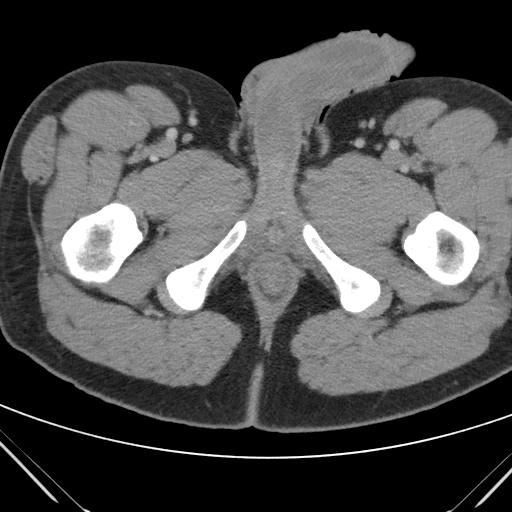
[im 5/97  bone]
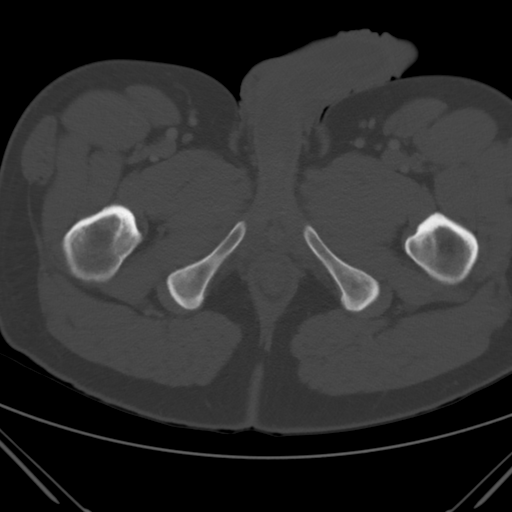
[im 15/97  soft-tissue]
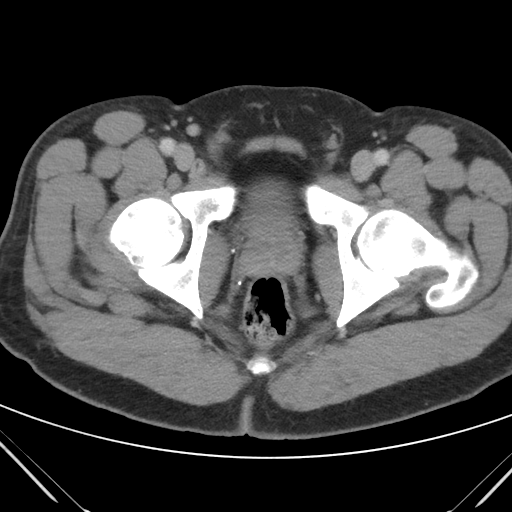
[im 20/97  soft-tissue]
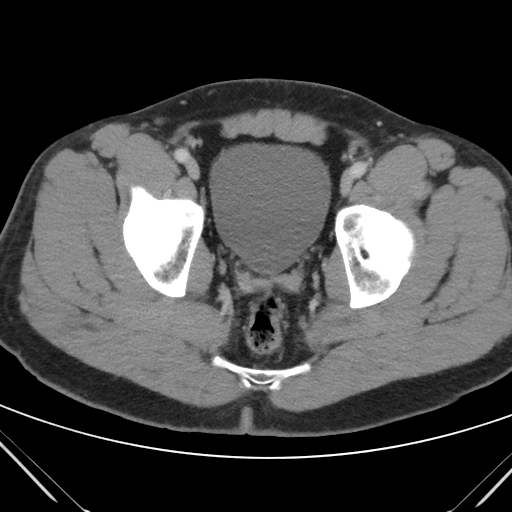
[im 25/97  soft-tissue]
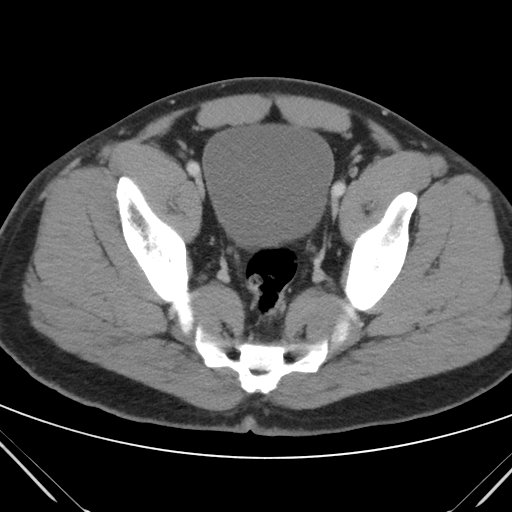
[im 34/97  soft-tissue]
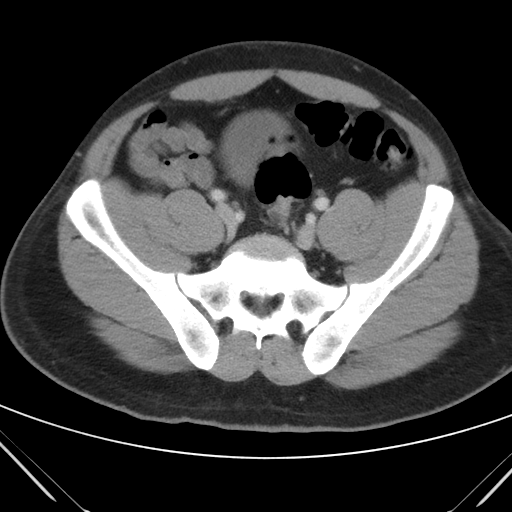
[im 39/97  soft-tissue]
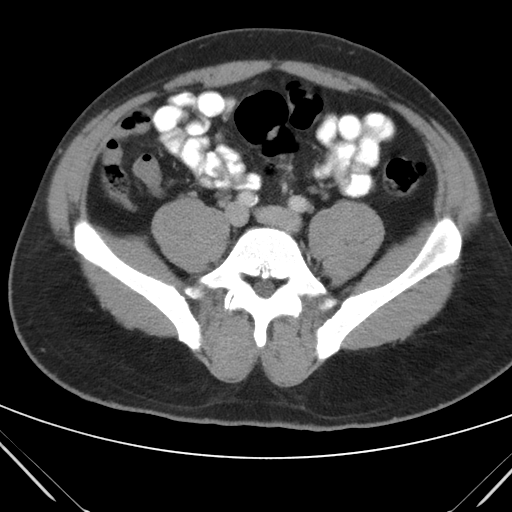
[im 44/97  soft-tissue]
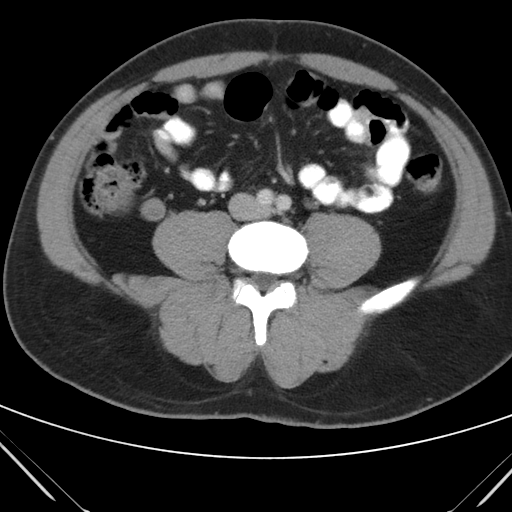
[im 53/97  soft-tissue]
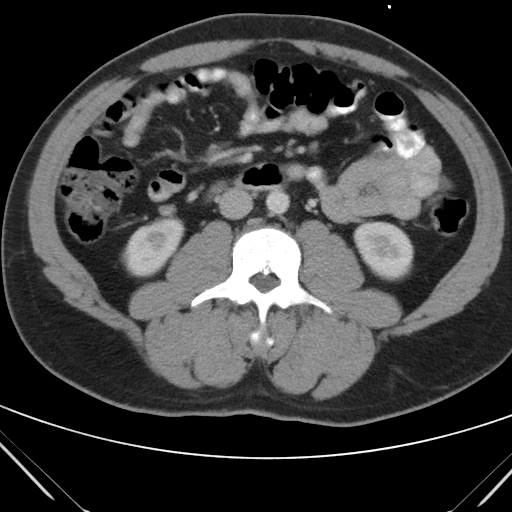
[im 58/97  soft-tissue]
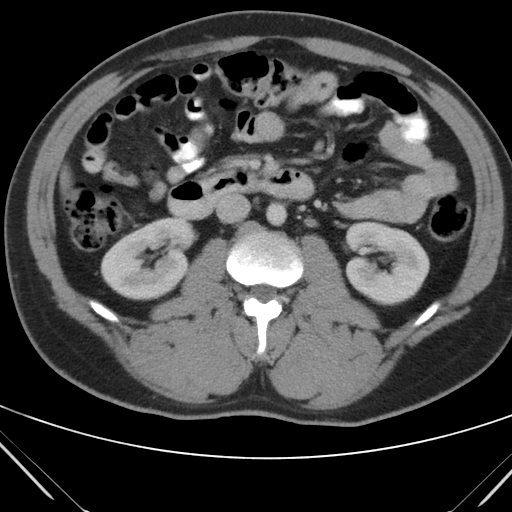
[im 58/97  bone]
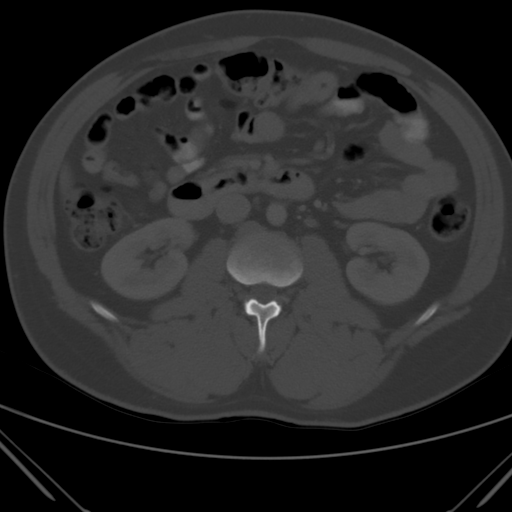
[im 63/97  soft-tissue]
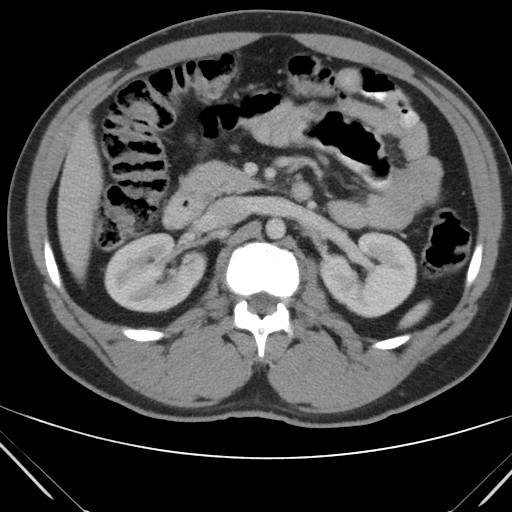
[im 73/97  soft-tissue]
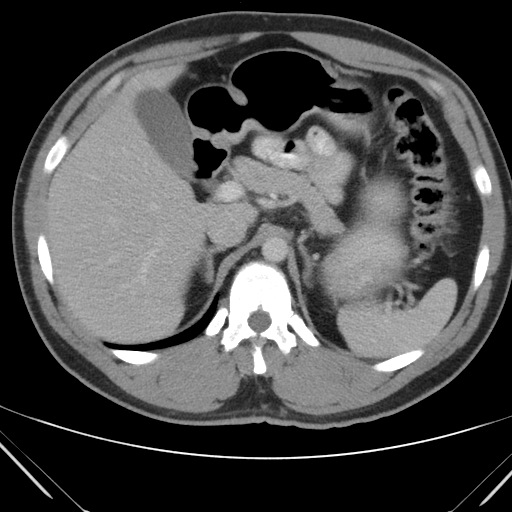
[im 77/97  soft-tissue]
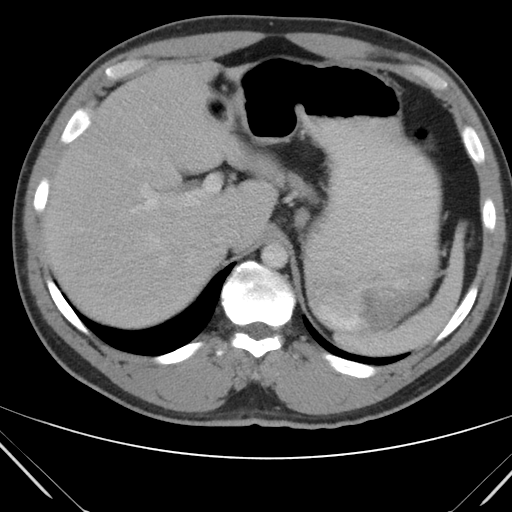
[im 82/97  soft-tissue]
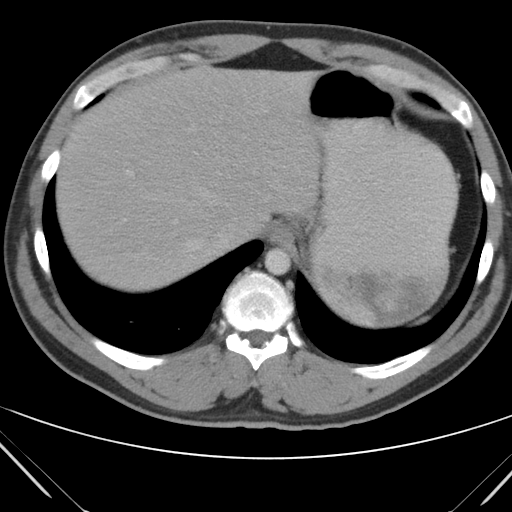
[im 92/97  soft-tissue]
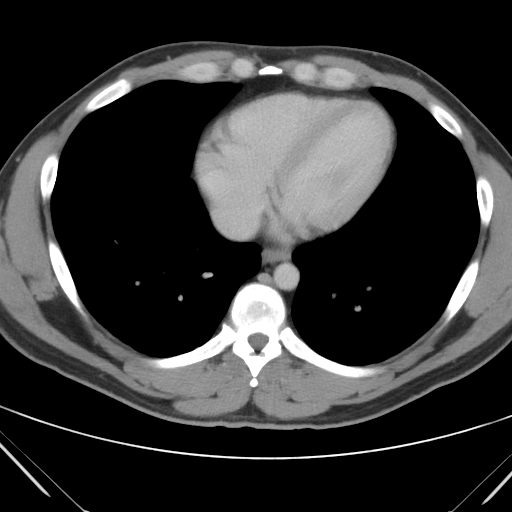

[coronal · coronal · 0.94mm/px · 3 of 122 slices shown]
[im 41/122  soft-tissue]
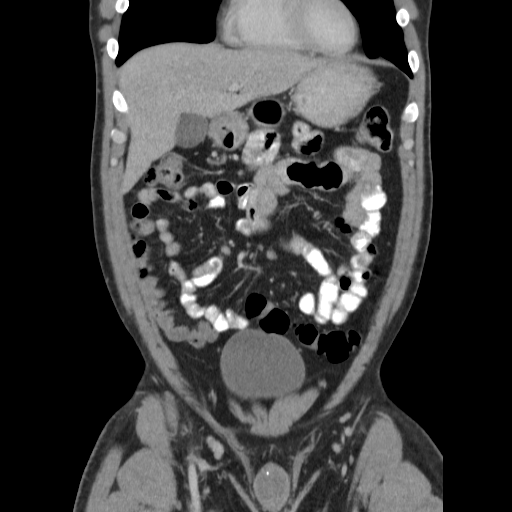
[im 54/122  soft-tissue]
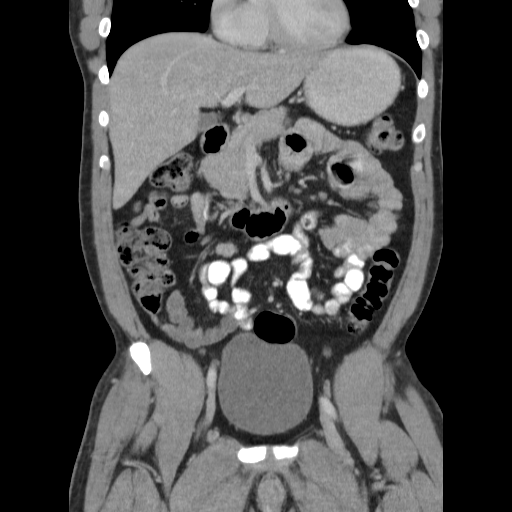
[im 68/122  soft-tissue]
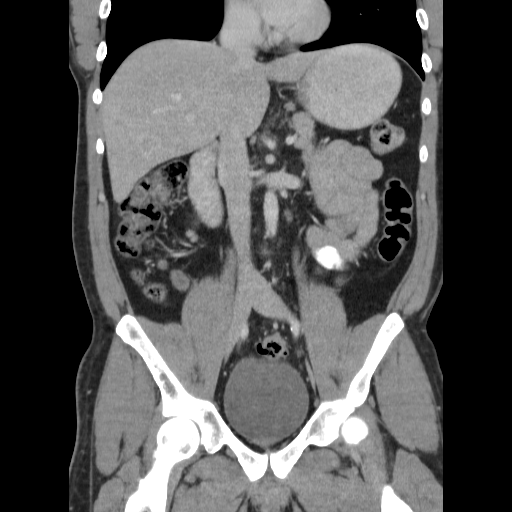

[17 of 46 positions shown; findings below may reference images not displayed]

FINDINGS: BODY WALL: Unremarkable.

LOWER CHEST: Unremarkable.

ABDOMEN/PELVIS:

Liver: No focal abnormality.

Biliary: No evidence of biliary obstruction or stone.

Pancreas: Unremarkable.

Spleen: Unremarkable.

Adrenals: Unremarkable.

Kidneys and ureters: No hydronephrosis or stone. Hypo enhancement of
the lower pole right kidney is associated with cortical thinning,
consistent with chronic scarring. This is new from 2302.

Bladder: Unremarkable.

Reproductive: Unremarkable.

Bowel: No obstruction. Normal appendix.

Retroperitoneum: No mass or adenopathy.

Peritoneum: No free fluid or gas.

Vascular: Early aortoiliac atherosclerosis.

OSSEOUS: No acute abnormalities.
IMPRESSION: No acute intra-abdominal abnormality.

## 2015-09-09 ENCOUNTER — Ambulatory Visit (INDEPENDENT_AMBULATORY_CARE_PROVIDER_SITE_OTHER): Payer: BLUE CROSS/BLUE SHIELD | Admitting: Family Medicine

## 2015-09-09 ENCOUNTER — Encounter: Payer: Self-pay | Admitting: Family Medicine

## 2015-09-09 VITALS — BP 130/76 | HR 78 | Temp 98.1°F | Resp 16 | Ht 69.0 in | Wt 230.4 lb

## 2015-09-09 DIAGNOSIS — W540XXA Bitten by dog, initial encounter: Secondary | ICD-10-CM | POA: Diagnosis not present

## 2015-09-09 DIAGNOSIS — H538 Other visual disturbances: Secondary | ICD-10-CM

## 2015-09-09 DIAGNOSIS — Z23 Encounter for immunization: Secondary | ICD-10-CM

## 2015-09-09 DIAGNOSIS — K219 Gastro-esophageal reflux disease without esophagitis: Secondary | ICD-10-CM | POA: Diagnosis not present

## 2015-09-09 DIAGNOSIS — S61451A Open bite of right hand, initial encounter: Secondary | ICD-10-CM | POA: Diagnosis not present

## 2015-09-09 DIAGNOSIS — Z0001 Encounter for general adult medical examination with abnormal findings: Secondary | ICD-10-CM | POA: Diagnosis not present

## 2015-09-09 DIAGNOSIS — Z0000 Encounter for general adult medical examination without abnormal findings: Secondary | ICD-10-CM

## 2015-09-09 LAB — CBC WITH DIFFERENTIAL/PLATELET
BASOS PCT: 0.4 % (ref 0.0–3.0)
Basophils Absolute: 0 10*3/uL (ref 0.0–0.1)
EOS PCT: 2.6 % (ref 0.0–5.0)
Eosinophils Absolute: 0.2 10*3/uL (ref 0.0–0.7)
HEMATOCRIT: 39.8 % (ref 39.0–52.0)
HEMOGLOBIN: 13.1 g/dL (ref 13.0–17.0)
LYMPHS PCT: 34.2 % (ref 12.0–46.0)
Lymphs Abs: 2.7 10*3/uL (ref 0.7–4.0)
MCHC: 33 g/dL (ref 30.0–36.0)
MCV: 83.6 fl (ref 78.0–100.0)
MONO ABS: 0.8 10*3/uL (ref 0.1–1.0)
Monocytes Relative: 9.9 % (ref 3.0–12.0)
Neutro Abs: 4.2 10*3/uL (ref 1.4–7.7)
Neutrophils Relative %: 52.9 % (ref 43.0–77.0)
Platelets: 241 10*3/uL (ref 150.0–400.0)
RBC: 4.76 Mil/uL (ref 4.22–5.81)
RDW: 14.4 % (ref 11.5–15.5)
WBC: 8 10*3/uL (ref 4.0–10.5)

## 2015-09-09 LAB — BASIC METABOLIC PANEL
BUN: 14 mg/dL (ref 6–23)
CHLORIDE: 105 meq/L (ref 96–112)
CO2: 25 mEq/L (ref 19–32)
CREATININE: 1.31 mg/dL (ref 0.40–1.50)
Calcium: 9.4 mg/dL (ref 8.4–10.5)
GFR: 76.34 mL/min (ref >60.00)
Glucose, Bld: 75 mg/dL (ref 70–99)
POTASSIUM: 4 meq/L (ref 3.5–5.1)
SODIUM: 139 meq/L (ref 135–145)

## 2015-09-09 LAB — TSH: TSH: 1.2 u[IU]/mL (ref 0.35–4.50)

## 2015-09-09 LAB — LIPID PANEL
CHOL/HDL RATIO: 5
Cholesterol: 249 mg/dL — ABNORMAL HIGH (ref 0–200)
HDL: 53 mg/dL (ref >39.00)
LDL Cholesterol: 169 mg/dL — ABNORMAL HIGH (ref 0–99)
NONHDL: 196.06
Triglycerides: 134 mg/dL (ref 0.0–149.0)
VLDL: 26.8 mg/dL (ref 0.0–40.0)

## 2015-09-09 LAB — HEPATIC FUNCTION PANEL
ALT: 22 U/L (ref 0–53)
AST: 18 U/L (ref 0–37)
Albumin: 4.2 g/dL (ref 3.5–5.2)
Alkaline Phosphatase: 67 U/L (ref 39–117)
BILIRUBIN DIRECT: 0.1 mg/dL (ref 0.0–0.3)
BILIRUBIN TOTAL: 0.6 mg/dL (ref 0.2–1.2)
Total Protein: 6.7 g/dL (ref 6.0–8.3)

## 2015-09-09 LAB — PSA: PSA: 0.63 ng/mL (ref 0.10–4.00)

## 2015-09-09 MED ORDER — OMEPRAZOLE 40 MG PO CPDR: 40.0000 mg | DELAYED_RELEASE_CAPSULE | Freq: Every day | ORAL | Status: DC

## 2015-09-09 MED ORDER — AMOXICILLIN-POT CLAVULANATE 875-125 MG PO TABS: 1.0000 | ORAL_TABLET | Freq: Two times a day (BID) | ORAL | Status: DC

## 2015-09-09 NOTE — Assessment & Plan Note (Signed)
Pt's PE WNL w/ exception of being overweight.  UTD on colonoscopy.  Due for Tdap- given today.  Stressed importance of healthy diet, regular exercise, and smoking cessation.  Check labs.  Anticipatory guidance provided.

## 2015-09-09 NOTE — Progress Notes (Signed)
   Subjective:    Patient ID: Leslie Mcmillan, male    DOB: 1970/08/24, 45 y.o.   MRN: [id number]  HPI CPE- UTD on colonoscopy.  Due for Tdap.  Dog bite this AM- pt has new puppy and got bit on R palm.  Due for Tdap.  Review of Systems Patient reports no hearing changes, anorexia, fever ,adenopathy, persistant/recurrent hoarseness, swallowing issues, chest pain, palpitations, edema, persistant/recurrent cough, hemoptysis, dyspnea (rest,exertional, paroxysmal nocturnal), gastrointestinal  bleeding (melena, rectal bleeding), abdominal pain, GU symptoms (dysuria, hematuria, voiding/incontinence issues) syncope, focal weakness, memory loss, numbness & tingling, skin/hair/nail changes, depression, anxiety, abnormal bruising/bleeding, musculoskeletal symptoms/signs.   + blurry vision w/ reading +GERD    Objective:   Physical Exam General Appearance:    Alert, cooperative, no distress, appears stated age  Head:    Normocephalic, without obvious abnormality, atraumatic  Eyes:    PERRL, conjunctiva/corneas clear, EOM's intact, fundi    benign, both eyes       Ears:    Normal TM's and external ear canals, both ears  Nose:   Nares normal, septum midline, mucosa normal, no drainage   or sinus tenderness  Throat:   Lips, mucosa, and tongue normal; teeth and gums normal  Neck:   Supple, symmetrical, trachea midline, no adenopathy;       thyroid:  No enlargement/tenderness/nodules  Back:     Symmetric, no curvature, ROM normal, no CVA tenderness  Lungs:     Clear to auscultation bilaterally, respirations unlabored  Chest wall:    No tenderness or deformity  Heart:    Regular rate and rhythm, S1 and S2 normal, no murmur, rub   or gallop  Abdomen:     Soft, non-tender, bowel sounds active all four quadrants,    no masses, no organomegaly  Genitalia:    Normal male without lesion, masses,discharge or tenderness  Rectal:    Deferred due to young age  Extremities:   no cyanosis or edema, 5 cm dog bite  on R palm  Pulses:   2+ and symmetric all extremities  Skin:   Skin color, texture, turgor normal, no rashes or lesions  Lymph nodes:   Cervical, supraclavicular, and axillary nodes normal  Neurologic:   CNII-XII intact. Normal strength, sensation and reflexes      throughout          Assessment & Plan:

## 2015-09-09 NOTE — Patient Instructions (Signed)
Follow up in 6 months to recheck BP and cholesterol We'll notify you of your lab results and make any changes if needed Continue to work on healthy diet and regular exercise- you can do it! We'll call you with your eye exam Start the Omeprazole once daily for the heartburn Take the Augmentin twice daily x10 days (with food) for hand and dental infection Keep wound clean and dry- wash out at least twice daily w/ peroxide and keep covered Call with any questions or concerns Have a great summer!!!

## 2015-09-09 NOTE — Assessment & Plan Note (Signed)
New.  Pt has large 5 cm bite on heel of R palm but thankfully it's not very deep.  No steri strips or sutures as it is a 'dirty bite'.  Wound cleaned w/ peroxide today and dressed w/ abx ointment.  Start augmentin.  Tdap updated.  Reviewed supportive care and red flags that should prompt return.  Pt expressed understanding and is in agreement w/ plan.

## 2015-09-09 NOTE — Progress Notes (Signed)
Pre visit review using our clinic review tool, if applicable. No additional management support is needed unless otherwise documented below in the visit note. 

## 2015-09-09 NOTE — Assessment & Plan Note (Signed)
New.  Pt reports burning in upper abdomen and into chest.  Discussed need for smoking cessation.  Reviewed lifestyle and dietary modifications.  Start PPI.  Pt expressed understanding and is in agreement w/ plan.

## 2015-11-29 ENCOUNTER — Ambulatory Visit (HOSPITAL_BASED_OUTPATIENT_CLINIC_OR_DEPARTMENT_OTHER): Payer: BLUE CROSS/BLUE SHIELD | Admitting: Hematology

## 2015-11-29 ENCOUNTER — Encounter: Payer: Self-pay | Admitting: Hematology

## 2015-11-29 ENCOUNTER — Telehealth: Payer: Self-pay | Admitting: Hematology

## 2015-11-29 ENCOUNTER — Other Ambulatory Visit (HOSPITAL_BASED_OUTPATIENT_CLINIC_OR_DEPARTMENT_OTHER): Payer: BLUE CROSS/BLUE SHIELD

## 2015-11-29 VITALS — BP 142/82 | HR 97 | Temp 98.0°F | Resp 18 | Ht 69.0 in | Wt 213.1 lb

## 2015-11-29 DIAGNOSIS — Z720 Tobacco use: Secondary | ICD-10-CM

## 2015-11-29 DIAGNOSIS — R197 Diarrhea, unspecified: Secondary | ICD-10-CM | POA: Diagnosis not present

## 2015-11-29 DIAGNOSIS — R109 Unspecified abdominal pain: Secondary | ICD-10-CM | POA: Diagnosis not present

## 2015-11-29 DIAGNOSIS — D3A012 Benign carcinoid tumor of the ileum: Secondary | ICD-10-CM

## 2015-11-29 DIAGNOSIS — C7A012 Malignant carcinoid tumor of the ileum: Secondary | ICD-10-CM

## 2015-11-29 LAB — CBC WITH DIFFERENTIAL/PLATELET
BASO%: 1.3 % (ref 0.0–2.0)
BASOS ABS: 0.1 10*3/uL (ref 0.0–0.1)
EOS%: 1.3 % (ref 0.0–7.0)
Eosinophils Absolute: 0.1 10*3/uL (ref 0.0–0.5)
HEMATOCRIT: 43.2 % (ref 38.4–49.9)
HGB: 14.2 g/dL (ref 13.0–17.1)
LYMPH#: 3 10*3/uL (ref 0.9–3.3)
LYMPH%: 32.4 % (ref 14.0–49.0)
MCH: 27.3 pg (ref 27.2–33.4)
MCHC: 33 g/dL (ref 32.0–36.0)
MCV: 82.8 fL (ref 79.3–98.0)
MONO#: 0.6 10*3/uL (ref 0.1–0.9)
MONO%: 6.3 % (ref 0.0–14.0)
NEUT#: 5.4 10*3/uL (ref 1.5–6.5)
NEUT%: 58.7 % (ref 39.0–75.0)
Platelets: 212 10*3/uL (ref 140–400)
RBC: 5.22 10*6/uL (ref 4.20–5.82)
RDW: 13.7 % (ref 11.0–14.6)
WBC: 9.2 10*3/uL (ref 4.0–10.3)

## 2015-11-29 LAB — COMPREHENSIVE METABOLIC PANEL
ALBUMIN: 3.9 g/dL (ref 3.5–5.0)
ALK PHOS: 87 U/L (ref 40–150)
ALT: 32 U/L (ref 0–55)
AST: 22 U/L (ref 5–34)
Anion Gap: 11 mEq/L (ref 3–11)
BUN: 13.8 mg/dL (ref 7.0–26.0)
CHLORIDE: 105 meq/L (ref 98–109)
CO2: 25 mEq/L (ref 22–29)
Calcium: 9.7 mg/dL (ref 8.4–10.4)
Creatinine: 1.4 mg/dL — ABNORMAL HIGH (ref 0.7–1.3)
EGFR: 72 mL/min/{1.73_m2} — ABNORMAL LOW (ref >90)
GLUCOSE: 88 mg/dL (ref 70–140)
POTASSIUM: 3.6 meq/L (ref 3.5–5.1)
SODIUM: 141 meq/L (ref 136–145)
Total Bilirubin: 0.45 mg/dL (ref 0.20–1.20)
Total Protein: 7.6 g/dL (ref 6.4–8.3)

## 2015-11-29 NOTE — Telephone Encounter (Signed)
03/30/2016 Appointments scheduled per 09/25 LOS. Patient was given AVS and schedule for future scheduled appointments.

## 2015-11-29 NOTE — Patient Instructions (Signed)
Smoking Cessation, Tips for Success If you are ready to quit smoking, congratulations! You have chosen to help yourself be healthier. Cigarettes bring nicotine, tar, carbon monoxide, and other irritants into your body. Your lungs, heart, and blood vessels will be able to work better without these poisons. There are many different ways to quit smoking. Nicotine gum, nicotine patches, a nicotine inhaler, or nicotine nasal spray can help with physical craving. Hypnosis, support groups, and medicines help break the habit of smoking. WHAT THINGS CAN I DO TO MAKE QUITTING EASIER?  Here are some tips to help you quit for good:  Pick a date when you will quit smoking completely. Tell all of your friends and family about your plan to quit on that date.  Do not try to slowly cut down on the number of cigarettes you are smoking. Pick a quit date and quit smoking completely starting on that day.  Throw away all cigarettes.   Clean and remove all ashtrays from your home, work, and car.  On a card, write down your reasons for quitting. Carry the card with you and read it when you get the urge to smoke.  Cleanse your body of nicotine. Drink enough water and fluids to keep your urine clear or pale yellow. Do this after quitting to flush the nicotine from your body.  Learn to predict your moods. Do not let a bad situation be your excuse to have a cigarette. Some situations in your life might tempt you into wanting a cigarette.  Never have "just one" cigarette. It leads to wanting another and another. Remind yourself of your decision to quit.  Change habits associated with smoking. If you smoked while driving or when feeling stressed, try other activities to replace smoking. Stand up when drinking your coffee. Brush your teeth after eating. Sit in a different chair when you read the paper. Avoid alcohol while trying to quit, and try to drink fewer caffeinated beverages. Alcohol and caffeine may urge you to  smoke.  Avoid foods and drinks that can trigger a desire to smoke, such as sugary or spicy foods and alcohol.  Ask people who smoke not to smoke around you.  Have something planned to do right after eating or having a cup of coffee. For example, plan to take a walk or exercise.  Try a relaxation exercise to calm you down and decrease your stress. Remember, you may be tense and nervous for the first 2 weeks after you quit, but this will pass.  Find new activities to keep your hands busy. Play with a pen, coin, or rubber band. Doodle or draw things on paper.  Brush your teeth right after eating. This will help cut down on the craving for the taste of tobacco after meals. You can also try mouthwash.   Use oral substitutes in place of cigarettes. Try using lemon drops, carrots, cinnamon sticks, or chewing gum. Keep them handy so they are available when you have the urge to smoke.  When you have the urge to smoke, try deep breathing.  Designate your home as a nonsmoking area.  If you are a heavy smoker, ask your health care provider about a prescription for nicotine chewing gum. It can ease your withdrawal from nicotine.  Reward yourself. Set aside the cigarette money you save and buy yourself something nice.  Look for support from others. Join a support group or smoking cessation program. Ask someone at home or at work to help you with your plan   to quit smoking.  Always ask yourself, "Do I need this cigarette or is this just a reflex?" Tell yourself, "Today, I choose not to smoke," or "I do not want to smoke." You are reminding yourself of your decision to quit.  Do not replace cigarette smoking with electronic cigarettes (commonly called e-cigarettes). The safety of e-cigarettes is unknown, and some may contain harmful chemicals.  If you relapse, do not give up! Plan ahead and think about what you will do the next time you get the urge to smoke. HOW WILL I FEEL WHEN I QUIT SMOKING? You  may have symptoms of withdrawal because your body is used to nicotine (the addictive substance in cigarettes). You may crave cigarettes, be irritable, feel very hungry, cough often, get headaches, or have difficulty concentrating. The withdrawal symptoms are only temporary. They are strongest when you first quit but will go away within 10-14 days. When withdrawal symptoms occur, stay in control. Think about your reasons for quitting. Remind yourself that these are signs that your body is healing and getting used to being without cigarettes. Remember that withdrawal symptoms are easier to treat than the major diseases that smoking can cause.  Even after the withdrawal is over, expect periodic urges to smoke. However, these cravings are generally short lived and will go away whether you smoke or not. Do not smoke! WHAT RESOURCES ARE AVAILABLE TO HELP ME QUIT SMOKING? Your health care provider can direct you to community resources or hospitals for support, which may include:  Group support.  Education.  Hypnosis.  Therapy.   This information is not intended to replace advice given to you by your health care provider. Make sure you discuss any questions you have with your health care provider.   Document Released: 11/19/2003 Document Revised: 03/13/2014 Document Reviewed: 08/08/2012 Elsevier Interactive Patient Education 2016 [employer]  

## 2015-11-29 NOTE — Progress Notes (Signed)
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 Johney Maine.     He reports that in winter of 2012, he abdominal pains described as tense muscle spasms, sharp pains that were severe in quality.  He went to the emergency room at [facility] and he reports that he was told that he inflammation and provided antibiotics and ibuprofen. CT of abdomen on 10/25/2010 showed that there was mild inflammation surrounding the proximal jejunum near the ligament of Treitz, findings which could represent focal enteritis. He had later complained of some severe headaches causing an emergency room visit [facility] ED and he had a CT of head in January 2015 which showed no acute intracranial abnormality.   He notes that there was slow progression of his abdominal pain over time.  Eventually, due to it recurrence and progression, he presented to [facility] ER and CT of abdomen and pelvis was unrevealing on 05/05/2013.  Due to progressive abdominal pain and rectal bleeding, he was referred to [facility] (Dr. Hilarie Fredrickson).  He had a Meckels scan on 05/22/2013  and MRI (05/30/2013) and capsule endoscopy.  Meckel's scan was without scintigraphic evidence of ectopic gastric mucosa and demonstrated that there was a subtle round soft tissue mass within the right lower quadrant which was difficult to distinguish from the adjacent loops of small bowel. This small mass measured 2.1 x 2.4 cm on 10/25/2010 increased slowly to 2.8 x 2.7 cm on CT of 05/05/2013. Common differential for this small slow growing lesion would be a  carcinoid tumor or a gastrointestinal stromal tumor. No retractile pattern typical of carcinoid.    MRI of abdomen and pelvis on 05/30/2013 showed a diffusely enhacing 3.0 x 2.0 x 2.2 cm mass in the  new mesentery with close associated mesenteric vessels but the lesion did not appear to invade the small bowel.  As noted on the Mecke's can, top differential diagnostic consideration would be GIST or carcinoid tumor.  Lymphoma was not completely excluded. Of note, he also had several jejunal loops that were somewhat featureless which could be due to fold thickening or effacement.   Based on results of these studies, he was referred to to Surgery (Dr. Johney Maine).  He was admitted for mesenteric mass and present carcinoid and underwent laparoscopic assisted resection of ileum and ymph nodes on 08/12/2013.  There were no complications.  His pathology noted carcinoid with 3/6 lymph nodes positive.  Medical oncology consultation was obtained and patient seen by Dr Juliann Mule on 09/04/2013.     Current treatment: Observation  INTERVAL HISTORY: He returns for follow up. He is doing well overall. He has loose BM 1-2 times a day, but has occasional stool leakage, a few times a day. Mild gassy, no significant nausea or bloating. He occasionally has right-sided abdominal pain, shooting pain, lasts only seconds. He is otherwise doing well. His weight is stable, no other new complaints.  PAST MEDICAL HISTORY:  Past Medical History:  Diagnosis Date  . Anxiety   . Blood in stool 5--31-2015  . Depression   . Diverticulitis   . Gastroduodenitis   .  GERD (gastroesophageal reflux disease)    no meds for  . Headache   . Hypertension      PAST SURGICAL HISTORY: Past Surgical History:  Procedure Laterality Date  . BOWEL RESECTION N/A 08/12/2013   Procedure: SMALL BOWEL ILEAL RESECTION;  Surgeon: Adin Hector, MD;  Location: [facility] ORS;  Service: General;  Laterality: N/A;  . COLONOSCOPY N/A 05/09/2013   Procedure: COLONOSCOPY;   Surgeon: Irene Shipper, MD;  Location: [facility] ENDOSCOPY;  Service: Endoscopy;  Laterality: N/A;  . ESOPHAGOGASTRODUODENOSCOPY N/A 05/09/2013   Procedure: ESOPHAGOGASTRODUODENOSCOPY (EGD);  Surgeon: Irene Shipper, MD;  Location: [facility];  Service: Endoscopy;  Laterality: N/A;  possible egd depending on colon results  . LAPAROSCOPIC APPENDECTOMY N/A 08/12/2013   Procedure: LAPAROSCOPIC ASSISTED APPENDECTOMY;  Surgeon: Adin Hector, MD;  Location: [facility] ORS;  Service: General;  Laterality: N/A;  . TONSILLECTOMY  as child     CURRENT MEDICATIONS: has a current medication list which includes the following prescription(s): atorvastatin, celecoxib, losartan, omeprazole, sertraline, trazodone, and tamsulosin.   ALLERGIES: Review of patient's allergies indicates no known allergies.   SOCIAL HISTORY:  reports that he has been smoking Cigarettes.  He has a 5.00 pack-year smoking history. He has never used smokeless tobacco. He reports that he drinks alcohol. He reports that he uses drugs, including Marijuana.   FAMILY HISTORY: family history includes Anuerysm in his mother; Diabetes in his paternal aunt, paternal grandmother, and paternal uncle; Hypertension in his paternal aunt and paternal grandmother; Liver disease in his paternal uncle.   LABORATORY DATA:  CBC Latest Ref Rng & Units 11/29/2015 09/09/2015 07/28/2015  WBC 4.0 - 10.3 10e3/uL 9.2 8.0 7.6  Hemoglobin 13.0 - 17.1 g/dL 14.2 13.1 13.5  Hematocrit 38.4 - 49.9 % 43.2 39.8 40.7  Platelets 140 - 400 10e3/uL 212 241.0 228    CMP Latest Ref Rng & Units 11/29/2015 09/09/2015 07/28/2015  Glucose 70 - 140 mg/dl 88 75 103  BUN 7.0 - 26.0 mg/dL 13.8 14 10.0  Creatinine 0.7 - 1.3 mg/dL 1.4(H) 1.31 1.2  Sodium 136 - 145 mEq/L 141 139 142  Potassium 3.5 - 5.1 mEq/L 3.6 4.0 3.9  Chloride 96 - 112 mEq/L - 105 -  CO2 22 - 29 mEq/L '25 25 24  ' Calcium 8.4 - 10.4 mg/dL 9.7 9.4 9.1  Total Protein 6.4 - 8.3 g/dL 7.6 6.7 6.6  Total Bilirubin 0.20 - 1.20 mg/dL  0.45 0.6 0.41  Alkaline Phos 40 - 150 U/L 87 67 71  AST 5 - 34 U/L '22 18 17  ' ALT 0 - 55 U/L 32 22 21   Chromogranin A  Status: Finalresult Visible to patient:  Not Released Nextappt: Today at 09:00 AM in Oncology Burr Medico, Krista Blue, MD) Dx:  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           Ref Range 7d ago  42moago  593mogo     Chromogranin A <=15 ng/mL 15 13CM <5R, CM          PATHOLOGY:  Diagnosis 1. Appendix, Other than Incidental - BENIGN APPENDICEAL TISSUE WITH REACTIVE LYMPHOID HYPERPLASIA AND FIBROUS OBLITERANS. - NO EVIDENCE OF CARCINOID TUMOR. - NO EVIDENCE OF ACTIVE INFLAMMATION OR NEOPLASIA. 2. Small intestine, resection for tumor, ileal with ileal mass, mesenteric mass - WELL DIFFERENTIATED NEUROENDOCRINE TUMOR (CARCINOID TUMOR), INVADING THROUGH THE MUSCULARIS PROPRIA INTO SUBSEROSAL TISSUE. - ANGIOLYMPHATIC INVASION PRESENT. - THREE OF SIX LYMPH NODES, POSITIVE FOR CARCINOID TUMOR (3/6). - RESECTION MARGINS, NEGATIVE FOR ATYPIA  OR MALIGNANCY. Microscopic Comment 2. 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 Histologic Grade : G1, Low grade Mitotic Rate < 2/10 high-power fields (HIGH POWER FIELD), Ki-67: <3% Microscopic Tumor Extension: Invading through the muscularis propria into subserosal tissue. Margins: Negative Proximal Margin: 8 cm Distal Margin: 19.5 cm Mesenteric (Radial) Margin : 10 cm from the mucosa lesion and 0.1 cm from the positive mesenteric lymph node, please see comment for details. Distance to the closest margin: please see comment for details.Lymph-Vascular Invasion: Present Perineural Invasion: Not identified Lymph nodes: number examined 6; number positive: 3. TNM Staging: pT3, pN1 1 of 3 FINAL for  Leslie Mcmillan, Leslie Mcmillan ([id number]) Microscopic Comment(continued) Additional findings: N/A Comments: The mucosal mass in the ileum measures 1.5 cm. The tumor cells invade through the muscularis propria into the subserosal tissue without penetration of the inked serosa.Immunohistochemical stains were performed and Ki-67 rate is less than 3%. 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 The mucosa tumor is 10 cm from the mesenteric resection margin. The tumor in the partially replaced lymph node is 0.1 cm from the inked mesenteric margin. The case was discussed with Dr. Neysa Bonito on 08/13/2013. (HCL:kh 08/13/13) H. CATHERINE LI MDPathologist, Electronic Signature(Case signed 08/14/2013)   REVIEW OF SYSTEMS:  Constitutional: Denies fevers, chills or abnormal weight loss Eyes: Denies blurriness of vision Ears, nose, mouth, throat, and face: Denies mucositis or sore throat Respiratory: Denies cough, dyspnea or wheezes Cardiovascular: Denies palpitation, chest discomfort or lower extremity swelling Gastrointestinal:  Denies nausea, heartburn; diarrhea as noted in HPI.  Skin: Denies abnormal skin rashes Lymphatics: Denies new lymphadenopathy or easy bruising Neurological:Denies numbness, tingling or new weaknesses Behavioral/Psych: Mood is stable, no new changes  All other systems were reviewed with the patient and are negative.  PHYSICAL EXAM:  height is '5\' 9"'  (1.753 m) and weight is 213 lb 1.6 oz (96.7 kg). His oral temperature is 98 F (36.7 C). His blood pressure is 142/82 (abnormal) and his pulse is 97. His respiration is 18 and oxygen saturation is 98%.    GENERAL:alert, no distress and comfortable; anxious, well developed and well nourished.  SKIN: skin color, texture, turgor are normal, no rashes or significant lesions EYES: normal, Conjunctiva are pink and non-injected, sclera clear OROPHARYNX:no exudate, no  erythema and lips, buccal mucosa, and tongue normal  NECK: supple, thyroid normal size, non-tender, without nodularity LYMPH:  no palpable lymphadenopathy in the cervical, axillary or inguinal LUNGS: clear to auscultation and percussion with normal breathing effort HEART: regular rate & rhythm and no murmurs and no lower extremity edema ABDOMEN:abdomen soft, mild tenderness at mid and left upper quadrant, normal bowel sounds; abdominal mid-line incision well healed; Musculoskeletal:no cyanosis of digits and no clubbing NEURO: alert & oriented x 3 with fluent speech, no focal motor/sensory deficits   RADIOGRAPHY:  MR ABDOMEN W WO CONTRAST 11/06/2013  CLINICAL DATA: Carcinoid tumor of ileum. Surgery 6/15. Intermittent abdominal pain. EXAM: MRI ABDOMEN WITHOUT AND WITH CONTRAST  TECHNIQUE: Multiplanar multisequence MR imaging of the abdomen was performed both before and after the administration of intravenous contrast.  CONTRAST: 52m MULTIHANCE GADOBENATE DIMEGLUMINE 529 MG/ML IV SOLN 18 cc MultiHance  COMPARISON: 05/30/2013 and CT of 05/05/2013.  FINDINGS: Normal heart size without pericardial or pleural effusion. A tiny cyst in the right  lobe of the liver on image 28 of series 8. No suspicious liver lesions and no abnormal foci of arterial hyper enhancement. Normal spleen, stomach, pancreas, gallbladder, biliary tract, adrenal glands, kidneys. No abdominal adenopathy or ascites. Normal caliber of abdominal bowel loops. Small jejunal mesenteric nodes are likely reactive. No evidence omental/peritoneal disease. IMPRESSION: No acute process or evidence of metastatic disease within the abdomen. Electronically Signed  By: Abigail Miyamoto M.D. On: 11/06/2013 11:31  CT renal stone study 05/12/2015 IMPRESSION: 2 mm stone in the distal left ureter with moderate proximal obstruction. Additional tiny intrarenal nonobstructing stones on the right.     IMPRESSION: Leslie Mcmillan is a 45 y.o. male with a  history of LOW GRADE CARCINOID s/p resection on 08/12/2013.    PLAN:  1.  Carcinoid of the Ileum s/p resection, Stage III (pT3,pN1) -We reviewed the natural history of small bowel carcinoid tumor, and the possibility of later recurrence.  -We discussed surveillance plan. -His clinically doing well, still has mild diarrhea and occasional abdominal cramps, recent CT scan of abdomen (renal stone protocol) was negative for recurrence. --Lab reviewed. His CBC, CMP are normal, chromogranin is still pending. No clinical suspicion for recurrence. -I'll continue surveillance. -Repeat CT scan in 4 month  2.  Chronic diarrhea, abdominal cramps  -Likely secondary to surgery.  -I encouraged him to use Imodium as needed -Avoid high-fiber diet -Physical therapy referral for pelvic exercise, for his mild stool incontinence  3. PTSD, chronic Headaches, Insomnia, fatigue -- follow up with his psychiatrist at [facility] -He is on trazodone and Zoloft  4. HTN -He is on Herceptin, but not compliant, he often forgets to take in the morning before he leaves the house for work. -I suggest him to take a medication in the evening  Follow-up: Return in 4 months with lab, CT chest, abdomen and pelvis with contrast on week before. Physical therapy referral for mild stool incontinence.  All questions were answered. The patient knows to call the clinic with any problems, questions or concerns. We can certainly see the patient much sooner if necessary.  I spent 20 minutes counseling the patient face to face. The total time spent in the appointment was 25 minutes.   Truitt Merle  11/29/2015

## 2015-12-01 LAB — CHROMOGRANIN A: CHROMOGRAN A: 2 nmol/L (ref 0–5)

## 2016-01-12 ENCOUNTER — Emergency Department (HOSPITAL_COMMUNITY)
Admission: EM | Admit: 2016-01-12 | Discharge: 2016-01-12 | Disposition: A | Discharge status: Home / Self Care | Payer: BLUE CROSS/BLUE SHIELD | Attending: Emergency Medicine | Admitting: Emergency Medicine

## 2016-01-12 ENCOUNTER — Encounter (HOSPITAL_COMMUNITY): Payer: Self-pay | Admitting: Emergency Medicine

## 2016-01-12 ENCOUNTER — Emergency Department (HOSPITAL_COMMUNITY): Payer: BLUE CROSS/BLUE SHIELD

## 2016-01-12 DIAGNOSIS — Z79899 Other long term (current) drug therapy: Secondary | ICD-10-CM | POA: Diagnosis not present

## 2016-01-12 DIAGNOSIS — I10 Essential (primary) hypertension: Secondary | ICD-10-CM | POA: Diagnosis not present

## 2016-01-12 DIAGNOSIS — F17210 Nicotine dependence, cigarettes, uncomplicated: Secondary | ICD-10-CM | POA: Diagnosis not present

## 2016-01-12 DIAGNOSIS — R109 Unspecified abdominal pain: Secondary | ICD-10-CM | POA: Diagnosis present

## 2016-01-12 LAB — CBC WITH DIFFERENTIAL/PLATELET
BASOS PCT: 0 %
Basophils Absolute: 0 10*3/uL (ref 0.0–0.1)
EOS ABS: 0.1 10*3/uL (ref 0.0–0.7)
Eosinophils Relative: 2 %
HEMATOCRIT: 38.3 % — AB (ref 39.0–52.0)
HEMOGLOBIN: 12.8 g/dL — AB (ref 13.0–17.0)
LYMPHS ABS: 2.1 10*3/uL (ref 0.7–4.0)
Lymphocytes Relative: 25 %
MCH: 27.8 pg (ref 26.0–34.0)
MCHC: 33.4 g/dL (ref 30.0–36.0)
MCV: 83.1 fL (ref 78.0–100.0)
MONO ABS: 0.7 10*3/uL (ref 0.1–1.0)
MONOS PCT: 8 %
NEUTROS PCT: 65 %
Neutro Abs: 5.4 10*3/uL (ref 1.7–7.7)
Platelets: 239 10*3/uL (ref 150–400)
RBC: 4.61 MIL/uL (ref 4.22–5.81)
RDW: 13.8 % (ref 11.5–15.5)
WBC: 8.4 10*3/uL (ref 4.0–10.5)

## 2016-01-12 LAB — URINALYSIS, ROUTINE W REFLEX MICROSCOPIC
BILIRUBIN URINE: NEGATIVE
GLUCOSE, UA: NEGATIVE mg/dL
KETONES UR: NEGATIVE mg/dL
Leukocytes, UA: NEGATIVE
Nitrite: NEGATIVE
PH: 6 (ref 5.0–8.0)
Protein, ur: NEGATIVE mg/dL
SPECIFIC GRAVITY, URINE: 1.017 (ref 1.005–1.030)

## 2016-01-12 LAB — BASIC METABOLIC PANEL
Anion gap: 7 (ref 5–15)
BUN: 15 mg/dL (ref 6–20)
CALCIUM: 8.3 mg/dL — AB (ref 8.9–10.3)
CHLORIDE: 107 mmol/L (ref 101–111)
CO2: 24 mmol/L (ref 22–32)
CREATININE: 1.22 mg/dL (ref 0.61–1.24)
GFR calc non Af Amer: >60 mL/min (ref >60)
GLUCOSE: 109 mg/dL — AB (ref 65–99)
Potassium: 4 mmol/L (ref 3.5–5.1)
Sodium: 138 mmol/L (ref 135–145)

## 2016-01-12 LAB — URINE MICROSCOPIC-ADD ON

## 2016-01-12 MED ORDER — ONDANSETRON HCL 4 MG/2ML IJ SOLN
4.0000 mg | Freq: Once | INTRAMUSCULAR | Status: AC
  Administered 2016-01-12: 4 mg via INTRAVENOUS
  Filled 2016-01-12: qty 2

## 2016-01-12 MED ORDER — OXYCODONE-ACETAMINOPHEN 5-325 MG PO TABS
1.0000 | ORAL_TABLET | Freq: Once | ORAL | Status: AC
  Administered 2016-01-12: 1 via ORAL
  Filled 2016-01-12: qty 1

## 2016-01-12 MED ORDER — ONDANSETRON 4 MG PO TBDP: 4.0000 mg | ORAL_TABLET | Freq: Three times a day (TID) | ORAL | 0 refills | Status: DC | PRN

## 2016-01-12 MED ORDER — KETOROLAC TROMETHAMINE 30 MG/ML IJ SOLN
30.0000 mg | Freq: Once | INTRAMUSCULAR | Status: AC
  Administered 2016-01-12: 30 mg via INTRAVENOUS
  Filled 2016-01-12: qty 1

## 2016-01-12 MED ORDER — TAMSULOSIN HCL 0.4 MG PO CAPS: 0.4000 mg | ORAL_CAPSULE | Freq: Every day | ORAL | 0 refills | Status: DC

## 2016-01-12 MED ORDER — SODIUM CHLORIDE 0.9 % IV BOLUS (SEPSIS)
1000.0000 mL | Freq: Once | INTRAVENOUS | Status: AC
  Administered 2016-01-12: 1000 mL via INTRAVENOUS

## 2016-01-12 MED ORDER — OXYCODONE-ACETAMINOPHEN 5-325 MG PO TABS: 1.0000 | ORAL_TABLET | Freq: Four times a day (QID) | ORAL | 0 refills | Status: DC | PRN

## 2016-01-12 MED ORDER — HYDROMORPHONE HCL 1 MG/ML IJ SOLN
1.0000 mg | Freq: Once | INTRAMUSCULAR | Status: AC
  Administered 2016-01-12: 1 mg via INTRAVENOUS
  Filled 2016-01-12: qty 1

## 2016-01-12 NOTE — ED Notes (Signed)
US in the room.

## 2016-01-12 NOTE — ED Provider Notes (Signed)
[name] DEPT Provider Note   CSN: [id number] Arrival date & time: 01/12/16  0315  By signing my name below, I, Gwenlyn Fudge, attest that this documentation has been prepared under the direction and in the presence of Merryl Hacker, MD. Electronically Signed: Gwenlyn Fudge, ED Scribe. 01/12/16. 3:48 AM.   History   Chief Complaint Chief Complaint  Patient presents with  . Flank Pain   The history is provided by the patient. No language interpreter was used.   HPI Comments: Leslie Mcmillan is a 45 y.o. male with PMHx of GERD, HTN and Diverticulitis who presents to the Emergency Department complaining of gradual onset, constant, 12/10, RLQ and right sided flank pain onset earlier today. Pain radiates down to his right groin area. He reports associated nausea. Pt has hx of kidney stones. He denies vomiting, hematuria, and dysuria.  Past Medical History:  Diagnosis Date  . Anxiety   . Blood in stool 5--31-2015  . Depression   . Diverticulitis   . Gastroduodenitis   . GERD (gastroesophageal reflux disease)    no meds for  . Headache   . Hypertension     Patient Active Problem List   Diagnosis Date Noted  . Dog bite of right hand 09/09/2015  . GERD (gastroesophageal reflux disease) 09/09/2015  . Hyperlipidemia 03/12/2015  . Knee pain, bilateral 08/13/2014  . Physical exam 07/14/2014  . Bilateral carpal tunnel syndrome 07/13/2014  . Diarrhea 03/26/2014  . Dental abscess 12/22/2013  . Allergic rhinitis 11/20/2013  . HTN (hypertension) 11/20/2013  . Insomnia 11/13/2013  . Migraine headache without aura 11/13/2013  . Condyloma acuminatum of penis 08/12/2013  . 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 Duodenitis without bleeding 05/09/2013  . Diverticulosis of colon without hemorrhage 05/09/2013    Past Surgical History:  Procedure Laterality Date  . BOWEL RESECTION N/A 08/12/2013   Procedure: SMALL BOWEL ILEAL RESECTION;   Surgeon: Adin Hector, MD;  Location: [facility];  Service: General;  Laterality: N/A;  . COLONOSCOPY N/A 05/09/2013   Procedure: COLONOSCOPY;  Surgeon: Irene Shipper, MD;  Location: [facility];  Service: Endoscopy;  Laterality: N/A;  . ESOPHAGOGASTRODUODENOSCOPY N/A 05/09/2013   Procedure: ESOPHAGOGASTRODUODENOSCOPY (EGD);  Surgeon: Irene Shipper, MD;  Location: [facility];  Service: Endoscopy;  Laterality: N/A;  possible egd depending on colon results  . LAPAROSCOPIC APPENDECTOMY N/A 08/12/2013   Procedure: LAPAROSCOPIC ASSISTED APPENDECTOMY;  Surgeon: Adin Hector, MD;  Location: [facility];  Service: General;  Laterality: N/A;  . TONSILLECTOMY  as child      Home Medications    Prior to Admission medications   Medication Sig Start Date End Date Taking? Authorizing Provider  atorvastatin (LIPITOR) 20 MG tablet Take 1 tablet (20 mg total) by mouth at bedtime. 03/16/15  Yes [name], MD  celecoxib (CELEBREX) 200 MG capsule Take 1 capsule (200 mg total) by mouth 2 (two) times daily. 08/13/14  Yes [name], MD  losartan (COZAAR) 100 MG tablet Take 1 tablet (100 mg total) by mouth daily. 08/13/14  Yes [name], MD  omeprazole (PRILOSEC) 40 MG capsule Take 1 capsule (40 mg total) by mouth daily. 09/09/15  Yes [name], MD  sertraline (ZOLOFT) 50 MG tablet Take 2 tablets (100 mg total) by mouth daily. 04/14/14  Yes Pieter Partridge, DO  traZODone (DESYREL) 50 MG tablet Take 0.5-1 tablets (25-50 mg total) by mouth at bedtime as needed for sleep.  07/14/14  Yes [name], MD  ondansetron (ZOFRAN ODT) 4 MG disintegrating tablet Take 1 tablet (4 mg total) by mouth every 8 (eight) hours as needed for nausea or vomiting. 01/12/16   Merryl Hacker, MD  oxyCODONE-acetaminophen (PERCOCET/ROXICET) 5-325 MG tablet Take 1 tablet by mouth every 6 (six) hours as needed for severe pain. 01/12/16   Merryl Hacker, MD  tamsulosin (FLOMAX) 0.4 MG CAPS capsule Take 1 capsule (0.4 mg  total) by mouth daily. 01/12/16   Merryl Hacker, MD    Family History Family History  Problem Relation Age of Onset  . Anuerysm Mother     brain  . Diabetes Paternal Grandmother   . Hypertension Paternal Grandmother   . Diabetes Paternal Uncle   . Diabetes Paternal Aunt   . Liver disease Paternal Uncle   . Hypertension Paternal Aunt     Social History Social History  Substance Use Topics  . Smoking status: Current Every Day Smoker    Packs/day: 0.50    Years: 10.00    Types: Cigarettes  . Smokeless tobacco: Never Used  . Alcohol use 0.0 oz/week     Comment: 2-3 beers some days     Allergies   Patient has no known allergies.  Review of Systems Review of Systems  Gastrointestinal: Positive for abdominal pain and nausea. Negative for vomiting.  Genitourinary: Positive for flank pain. Negative for dysuria and hematuria.  All other systems reviewed and are negative.  Physical Exam Updated Vital Signs BP 127/75 (BP Location: Right Arm)   Pulse 86   Temp 97.8 F (36.6 C) (Oral)   Resp 16   SpO2 96%   Physical Exam  Constitutional: He is oriented to person, place, and time. He appears well-developed and well-nourished.  Uncomfortable appearing  HENT:  Head: Normocephalic and atraumatic.  Cardiovascular: Normal rate, regular rhythm and normal heart sounds.   No murmur heard. Pulmonary/Chest: Effort normal and breath sounds normal. No respiratory distress. He has no wheezes.  Abdominal: Soft. Bowel sounds are normal. There is no tenderness. There is no rebound.  Musculoskeletal: He exhibits no edema.  Neurological: He is alert and oriented to person, place, and time.  Skin: Skin is warm and dry.  Psychiatric: He has a normal mood and affect.  Nursing note and vitals reviewed.    ED Treatments / Results  DIAGNOSTIC STUDIES: Oxygen Saturation is 100% on RA, normal by my interpretation.    COORDINATION OF CARE: 3:48 AM Discussed treatment plan with pt at  bedside which includes Zofran, Dilaudid and lab work and pt agreed to plan.  Labs (all labs ordered are listed, but only abnormal results are displayed) Labs Reviewed  URINALYSIS, ROUTINE W REFLEX MICROSCOPIC (NOT AT [facility]) - Abnormal; Notable for the following:       Result Value   Hgb urine dipstick LARGE (*)    All other components within normal limits  BASIC METABOLIC PANEL - Abnormal; Notable for the following:    Glucose, Bld 109 (*)    Calcium 8.3 (*)    All other components within normal limits  CBC WITH DIFFERENTIAL/PLATELET - Abnormal; Notable for the following:    Hemoglobin 12.8 (*)    HCT 38.3 (*)    All other components within normal limits  URINE MICROSCOPIC-ADD ON - Abnormal; Notable for the following:    Squamous Epithelial / LPF 0-5 (*)    Bacteria, UA RARE (*)    All other components within normal limits  CBC WITH DIFFERENTIAL/PLATELET    EKG  EKG Interpretation None       Radiology No results found.  Procedures Procedures (including critical care time)  Medications Ordered in ED Medications  sodium chloride 0.9 % bolus 1,000 mL (0 mLs Intravenous Stopped 01/12/16 0538)  HYDROmorphone (DILAUDID) injection 1 mg (1 mg Intravenous Given 01/12/16 0409)  ondansetron (ZOFRAN) injection 4 mg (4 mg Intravenous Given 01/12/16 0409)  oxyCODONE-acetaminophen (PERCOCET/ROXICET) 5-325 MG per tablet 1 tablet (1 tablet Oral Given 01/12/16 0634)  ketorolac (TORADOL) 30 MG/ML injection 30 mg (30 mg Intravenous Given 01/12/16 [id number])     Initial Impression / Assessment and Plan / ED Course  I have reviewed the triage vital signs and the nursing notes.  Pertinent labs & imaging results that were available during my care of the patient were reviewed by me and considered in my medical decision making (see chart for details).  Clinical Course    Patient presents with flank pain consistent with prior kidney stone pain. Nontoxic but uncomfortable appearing. Lab work obtained.  Creatinine at baseline. Hemoglobin in the urine. Patient given pain and nausea medication. Renal ultrasound negative for hydro-. No stone visualized. Will treat as a kidney stone. On recheck, patient with improved pain. Able to tolerate fluids. Supportive measures at home. Urology follow-up.  After history, exam, and medical workup I feel the patient has been appropriately medically screened and is safe for discharge home. Pertinent diagnoses were discussed with the patient. Patient was given return precautions.  Final Clinical Impressions(s) / ED Diagnoses   Final diagnoses:  Flank pain    New Prescriptions Discharge Medication List as of 01/12/2016  7:30 AM    START taking these medications   Details  ondansetron (ZOFRAN ODT) 4 MG disintegrating tablet Take 1 tablet (4 mg total) by mouth every 8 (eight) hours as needed for nausea or vomiting., Starting Wed 01/12/2016, Print    oxyCODONE-acetaminophen (PERCOCET/ROXICET) 5-325 MG tablet Take 1 tablet by mouth every 6 (six) hours as needed for severe pain., Starting Wed 01/12/2016, Print         Merryl Hacker, MD 01/17/16 [phone]

## 2016-01-12 NOTE — ED Triage Notes (Signed)
Pt is c/o right flank pain that started earlier today  Pt has nausea without vomiting  Hx of kidney stones

## 2016-03-16 ENCOUNTER — Encounter: Payer: Self-pay | Admitting: Family Medicine

## 2016-03-16 ENCOUNTER — Ambulatory Visit (INDEPENDENT_AMBULATORY_CARE_PROVIDER_SITE_OTHER): Payer: BLUE CROSS/BLUE SHIELD | Admitting: Family Medicine

## 2016-03-16 VITALS — BP 138/81 | HR 87 | Temp 98.1°F | Resp 16 | Ht 69.0 in | Wt 235.5 lb

## 2016-03-16 DIAGNOSIS — E785 Hyperlipidemia, unspecified: Secondary | ICD-10-CM | POA: Diagnosis not present

## 2016-03-16 DIAGNOSIS — Z23 Encounter for immunization: Secondary | ICD-10-CM

## 2016-03-16 DIAGNOSIS — E669 Obesity, unspecified: Secondary | ICD-10-CM

## 2016-03-16 DIAGNOSIS — K219 Gastro-esophageal reflux disease without esophagitis: Secondary | ICD-10-CM | POA: Diagnosis not present

## 2016-03-16 DIAGNOSIS — I10 Essential (primary) hypertension: Secondary | ICD-10-CM | POA: Diagnosis not present

## 2016-03-16 LAB — CBC WITH DIFFERENTIAL/PLATELET
BASOS ABS: 0.1 10*3/uL (ref 0.0–0.1)
Basophils Relative: 0.6 % (ref 0.0–3.0)
Eosinophils Absolute: 0.3 10*3/uL (ref 0.0–0.7)
Eosinophils Relative: 3.2 % (ref 0.0–5.0)
HEMATOCRIT: 43.6 % (ref 39.0–52.0)
Hemoglobin: 14.4 g/dL (ref 13.0–17.0)
LYMPHS PCT: 30.1 % (ref 12.0–46.0)
Lymphs Abs: 2.8 10*3/uL (ref 0.7–4.0)
MCHC: 33.1 g/dL (ref 30.0–36.0)
MCV: 84 fl (ref 78.0–100.0)
Monocytes Absolute: 0.8 10*3/uL (ref 0.1–1.0)
Monocytes Relative: 9.2 % (ref 3.0–12.0)
Neutro Abs: 5.2 10*3/uL (ref 1.4–7.7)
Neutrophils Relative %: 56.9 % (ref 43.0–77.0)
Platelets: 277 10*3/uL (ref 150.0–400.0)
RBC: 5.19 Mil/uL (ref 4.22–5.81)
RDW: 14.6 % (ref 11.5–15.5)
WBC: 9.2 10*3/uL (ref 4.0–10.5)

## 2016-03-16 LAB — BASIC METABOLIC PANEL
BUN: 21 mg/dL (ref 6–23)
CALCIUM: 9.6 mg/dL (ref 8.4–10.5)
CO2: 29 mEq/L (ref 19–32)
CREATININE: 1.47 mg/dL (ref 0.40–1.50)
Chloride: 105 mEq/L (ref 96–112)
GFR: 66.67 mL/min (ref >60.00)
GLUCOSE: 103 mg/dL — AB (ref 70–99)
Potassium: 4.4 mEq/L (ref 3.5–5.1)
SODIUM: 141 meq/L (ref 135–145)

## 2016-03-16 LAB — LIPID PANEL
Cholesterol: 269 mg/dL — ABNORMAL HIGH (ref 0–200)
HDL: 65.5 mg/dL (ref >39.00)
LDL CALC: 178 mg/dL — AB (ref 0–99)
NonHDL: 203.31
TRIGLYCERIDES: 127 mg/dL (ref 0.0–149.0)
Total CHOL/HDL Ratio: 4
VLDL: 25.4 mg/dL (ref 0.0–40.0)

## 2016-03-16 LAB — HEPATIC FUNCTION PANEL
ALBUMIN: 4.4 g/dL (ref 3.5–5.2)
ALT: 22 U/L (ref 0–53)
AST: 18 U/L (ref 0–37)
Alkaline Phosphatase: 74 U/L (ref 39–117)
Bilirubin, Direct: 0.1 mg/dL (ref 0.0–0.3)
TOTAL PROTEIN: 7.1 g/dL (ref 6.0–8.3)
Total Bilirubin: 0.8 mg/dL (ref 0.2–1.2)

## 2016-03-16 LAB — TSH: TSH: 0.47 u[IU]/mL (ref 0.35–4.50)

## 2016-03-16 MED ORDER — OMEPRAZOLE 40 MG PO CPDR: 40.0000 mg | DELAYED_RELEASE_CAPSULE | Freq: Every day | ORAL | 3 refills | Status: DC

## 2016-03-16 NOTE — Assessment & Plan Note (Signed)
Deteriorated since pt stopped taking medication.  Restart PPI.  Reviewed lifestyle and dietary modifications.  Will follow.

## 2016-03-16 NOTE — Patient Instructions (Signed)
Schedule your complete physical in 6 months We'll notify you of your lab results and make any changes if needed Continue to work on healthy diet and regular exercise- you can do it! We'll call you with your nutrition appt Restart the Omeprazole daily for the acid reflux Call with any questions or concerns Happy New Year!!

## 2016-03-16 NOTE — Assessment & Plan Note (Signed)
Chronic problem.  Adequate but not ideal control.  Reviewed need for low salt diet, regular exercise.  Check labs.  No anticipated med changes.  Will follow.

## 2016-03-16 NOTE — Progress Notes (Signed)
Pre visit review using our clinic review tool, if applicable. No additional management support is needed unless otherwise documented below in the visit note. 

## 2016-03-16 NOTE — Assessment & Plan Note (Signed)
Ongoing for pt- he's gained 22 lbs since last visit.  Pt is interested in seeing Nutrition.  Reviewed need for healthy diet and regular exercise- referral placed.  Will follow.

## 2016-03-16 NOTE — Progress Notes (Signed)
   Subjective:    Patient ID: Leslie Mcmillan, male    DOB: 1971-02-14, 46 y.o.   MRN: [id number]  HPI HTN- chronic problem, on Losartan daily w/ adequate control.  Pt has gained 22 lbs since September.  Pt admits to increased stress today.  Denies CP, SOB, HAs, visual changes, edema.  Hyperlipidemia- chronic problem, on Lipitor 20mg  daily.  Denies abd pain, N/V, myalgias.  GERD- pt reports sxs have returned.  No longer taking Omeprazole.     Review of Systems For ROS see HPI     Objective:   Physical Exam  Constitutional: He is oriented to person, place, and time. He appears well-developed and well-nourished. No distress.  overweight  HENT:  Head: Normocephalic and atraumatic.  Eyes: Conjunctivae and EOM are normal. Pupils are equal, round, and reactive to light.  Neck: Normal range of motion. Neck supple. No thyromegaly present.  Cardiovascular: Normal rate, regular rhythm, normal heart sounds and intact distal pulses.   No murmur heard. Pulmonary/Chest: Effort normal and breath sounds normal. No respiratory distress.  Abdominal: Soft. Bowel sounds are normal. He exhibits no distension.  Musculoskeletal: He exhibits no edema.  Lymphadenopathy:    He has no cervical adenopathy.  Neurological: He is alert and oriented to person, place, and time. No cranial nerve deficit.  Skin: Skin is warm and dry.  Psychiatric: He has a normal mood and affect. His behavior is normal.  Vitals reviewed.         Assessment & Plan:

## 2016-03-16 NOTE — Assessment & Plan Note (Signed)
Chronic problem.  Tolerating statin w/o difficulty.  Check labs.  Adjust meds prn  

## 2016-03-30 ENCOUNTER — Encounter: Payer: Self-pay | Admitting: Hematology

## 2016-03-30 ENCOUNTER — Other Ambulatory Visit: Payer: BLUE CROSS/BLUE SHIELD

## 2016-03-30 ENCOUNTER — Ambulatory Visit: Payer: BLUE CROSS/BLUE SHIELD | Admitting: Hematology

## 2016-04-25 ENCOUNTER — Encounter: Payer: BLUE CROSS/BLUE SHIELD | Attending: Family Medicine | Admitting: Registered"

## 2016-04-25 DIAGNOSIS — Z6839 Body mass index (BMI) 39.0-39.9, adult: Secondary | ICD-10-CM | POA: Diagnosis not present

## 2016-04-25 DIAGNOSIS — E669 Obesity, unspecified: Secondary | ICD-10-CM

## 2016-04-25 DIAGNOSIS — Z713 Dietary counseling and surveillance: Secondary | ICD-10-CM | POA: Insufficient documentation

## 2016-04-25 NOTE — Patient Instructions (Addendum)
Plan: Consider asking doctor if they think it would be good to have a blood test for B12 and vitamin D - may help with energy Consider changing the OTC B12 for a B complex Consider using your treadmill 30 min daily as tolerated (can work up to this amount) Consider eating 3 meals a day Consider having fish 2-3 week Consider adding nuts to your diet, especially almonds & peanuts Consider adding oatmeal to your diet, can help with cholesterol Whole grain bread, pasta Consider veggies for snacks Consider allowing at 20 min for a meal and eat slowly Use Www.calorieking.com to check nutrition facts when eating out BRAT diet - Bananas, Rice, Apples, Toast. Can try for loose stools

## 2016-04-25 NOTE — Progress Notes (Signed)
Medical Nutrition Therapy:  Appt start time: 1115 end time:  1215.   Assessment:  Primary concerns today: Wants to know what he can do to improve his overall health. Patient reports that he uses CPAP at night. 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 Patient also stated that he has been having loose stools and feels like he is not fully evacuating his bowels. He has an appointment with his doctor this Friday and I advised that he be sure to talk with the doctor about this concern.  Preferred Learning Style:   No preference indicated   Learning Readiness:   Ready  MEDICATIONS: reviewed   DIETARY INTAKE:  Usual eating pattern includes 1 meals and 1-2 snacks per day.  Avoided foods include pork.    24-hr recall:  B ( AM): none OR grits, eggs, bacon, cheese, toast, OJ OR jerky & chips Snk ( AM): chips  L ( PM): none OR [employer] OR hotdogs, chips at convenience Snk ( PM): chips D ( PM): noodles, TV dinner, tacos Snk ( PM): left over dinner, craves something sweet in the middle of the night - oatmeal cream cookies Beverages: OJ sweet tea, water (needs more), when working drinks Monsters at work for [employer]  Usual physical activity: moves furniture, a lot of activity, but not much in the off season.   Estimated energy needs: 2000 calories 225 g carbohydrates 150 g protein 56 g fat  Progress Towards Goal(s):  In progress.   Nutritional Diagnosis:  [place]-3.3 Overweight/obesity As related to inconsistent meals and frequent snacking on calorie dense snacks.  As evidenced by diet recall and BMI of 34.8.    Intervention:  Nutrition Education. Discussed food groups, portion size, and eating balanced meals throughout the day. Educated patient on reasons for chewing well and eating slowly. Also the importance of regular exercise for both physical and mental health.   Plan: Consider asking  doctor if they think it would be good to have a blood test for B12 and vitamin D - may help with energy Consider changing the OTC B12 for a B complex Consider using your treadmill 30 min daily as tolerated (can work up to this amount) Consider eating 3 meals a day Consider having fish 2-3 week Consider adding nuts to your diet, especially almonds & peanuts Consider adding oatmeal to your diet, can help with cholesterol Whole grain bread, pasta Consider veggies for snacks Consider allowing at 20 min for a meal and eat slowly Use Www.calorieking.com to check nutrition facts when eating out BRAT diet - Bananas, Rice, Apples, Toast. Can try for loose stools  Teaching Method Utilized: Visual Auditory  Handouts given during visit include:  My Plate  Snack sheet  Barriers to learning/adherence to lifestyle change: none  Demonstrated degree of understanding via:  Teach Back   Monitoring/Evaluation:  Dietary intake, exercise, and body weight in 6 week(s)

## 2016-04-27 NOTE — Progress Notes (Signed)
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 Johney Maine.     He reports that in winter of 2012, he abdominal pains described as tense muscle spasms, sharp pains that were severe in quality.  He went to the emergency room at [facility] and he reports that he was told that he inflammation and provided antibiotics and ibuprofen. CT of abdomen on 10/25/2010 showed that there was mild inflammation surrounding the proximal jejunum near the ligament of Treitz, findings which could represent focal enteritis. He had later complained of some severe headaches causing an emergency room visit [facility] and he had a CT of head in January 2015 which showed no acute intracranial abnormality.   He notes that there was slow progression of his abdominal pain over time.  Eventually, due to it recurrence and progression, he presented to [facility] ER and CT of abdomen and pelvis was unrevealing on 05/05/2013.  Due to progressive abdominal pain and rectal bleeding, he was referred to [facility] (Dr. Hilarie Fredrickson).  He had a Meckels scan on 05/22/2013  and MRI (05/30/2013) and capsule endoscopy.  Meckel's scan was without scintigraphic evidence of ectopic gastric mucosa and demonstrated that there was a subtle round soft tissue mass within the right lower quadrant which was difficult to distinguish from the adjacent loops of small bowel. This small mass measured 2.1 x 2.4 cm on 10/25/2010 increased slowly to 2.8 x 2.7 cm on CT of 05/05/2013. Common differential for this small slow growing lesion would be a  carcinoid tumor or a gastrointestinal stromal tumor. No retractile pattern typical of carcinoid.    MRI of abdomen and pelvis on 05/30/2013 showed a diffusely enhacing 3.0 x 2.0 x 2.2 cm mass in the  new mesentery with close associated mesenteric vessels but the lesion did not appear to invade the small bowel.  As noted on the Mecke's can, top differential diagnostic consideration would be GIST or carcinoid tumor.  Lymphoma was not completely excluded. Of note, he also had several jejunal loops that were somewhat featureless which could be due to fold thickening or effacement.   Based on results of these studies, he was referred to to Surgery (Dr. Johney Maine).  He was admitted for mesenteric mass and present carcinoid and underwent laparoscopic assisted resection of ileum and ymph nodes on 08/12/2013.  There were no complications.  His pathology noted carcinoid with 3/6 lymph nodes positive.  Medical oncology consultation was obtained and patient seen by Dr Juliann Mule on 09/04/2013.     Current treatment: Observation  INTERVAL HISTORY: He returns for follow up. He has been doing well. He just met with a nutritionist at his diabetes clinic on Tuesday. He is just starting to eat right. He still has very frequent bowel movements, 3-4, a day. He feels as if every time he has a bowel movement he is not fully emptying his bowel and that is why he has to go again so soon. He has some abdominal cramping and bloating before he uses the bathroom. He has not been very active since it is the slow season at  work. He is going to try to walk on the treadmill every day. He has been having frequent hot flashes, which is new. There is blood in his stool. He doesn't see it in the toilet, but it is on the toilet paper. Denies any other concerns.    PAST MEDICAL HISTORY:  Past Medical History:  Diagnosis Date  . Anxiety   . Blood in stool 5--31-2015  . Depression   . Diverticulitis   . Gastroduodenitis   . GERD (gastroesophageal  reflux disease)    no meds for  . Headache   . Hypertension    PAST SURGICAL HISTORY: Past Surgical History:  Procedure Laterality Date  . BOWEL RESECTION N/A 08/12/2013   Procedure: SMALL BOWEL ILEAL RESECTION;  Surgeon: Adin Hector, MD;  Location: [facility];  Service: General;  Laterality: N/A;  . COLONOSCOPY N/A 05/09/2013   Procedure: COLONOSCOPY;  Surgeon: Irene Shipper, MD;  Location: [facility];  Service: Endoscopy;  Laterality: N/A;  . ESOPHAGOGASTRODUODENOSCOPY N/A 05/09/2013   Procedure: ESOPHAGOGASTRODUODENOSCOPY (EGD);  Surgeon: Irene Shipper, MD;  Location: [facility];  Service: Endoscopy;  Laterality: N/A;  possible egd depending on colon results  . LAPAROSCOPIC APPENDECTOMY N/A 08/12/2013   Procedure: LAPAROSCOPIC ASSISTED APPENDECTOMY;  Surgeon: Adin Hector, MD;  Location: [facility];  Service: General;  Laterality: N/A;  . TONSILLECTOMY  as child   CURRENT MEDICATIONS: has a current medication list which includes the following prescription(s): atorvastatin, losartan, omeprazole, sertraline, and trazodone.  ALLERGIES: Patient has no known allergies.  SOCIAL HISTORY:  reports that he has been smoking Cigarettes.  He has a 5.00 pack-year smoking history. He has never used smokeless tobacco. He reports that he drinks alcohol. He reports that he uses drugs, including Marijuana.  FAMILY HISTORY: family history includes Anuerysm in his mother; Diabetes in his paternal aunt, paternal grandmother, and paternal uncle; Hypertension in his paternal aunt and paternal grandmother; Liver disease in his paternal uncle.  REVIEW OF SYSTEMS:  Constitutional: Denies fevers, chills or abnormal weight loss (+) hot flashes Eyes: Denies blurriness of vision Ears, nose, mouth, throat, and face: Denies mucositis or sore throat Respiratory: Denies cough, dyspnea or wheezes Cardiovascular: Denies palpitation, chest discomfort or lower extremity swelling Gastrointestinal:  Denies nausea, heartburn;  diarrhea as noted in HPI. (+) frequent bowel movements, abdominal cramping, abdominal bloating, blood in stool Skin: Denies abnormal skin rashes Lymphatics: Denies new lymphadenopathy or easy bruising Neurological:Denies numbness, tingling or new weaknesses Behavioral/Psych: Mood is stable, no new changes  All other systems were reviewed with the patient and are negative.  PHYSICAL EXAM:   height is _0  (1.753 m) and weight is 244 lb 6.4 oz (110.9 kg). His oral temperature is 98.6 F (37 C). His blood pressure is 132/78 and his pulse is 89. His respiration is 17 and oxygen saturation is 97%.    GENERAL:alert, no distress and comfortable; anxious, well developed and well nourished.  SKIN: skin color, texture, turgor are normal, no rashes or significant lesions EYES: normal, Conjunctiva are pink and non-injected, sclera clear OROPHARYNX:no exudate, no erythema and lips, buccal mucosa, and tongue normal  NECK: supple, thyroid normal size, non-tender, without nodularity LYMPH:  no palpable lymphadenopathy in the cervical, axillary or inguinal LUNGS: clear to auscultation and percussion with normal breathing effort HEART: regular rate & rhythm and no murmurs and no lower extremity edema ABDOMEN:abdomen soft, mild tenderness at mid and left upper quadrant, normal bowel sounds; abdominal mid-line incision well healed; Diffuse abdominal tenderness,  more on right.  Musculoskeletal:no cyanosis of digits and no clubbing NEURO: alert & oriented x 3 with fluent speech, no focal motor/sensory deficits  LABORATORY DATA:    CMP Latest Ref Rng & Units 04/28/2016 03/16/2016 01/12/2016  Glucose 70 - 140 mg/dl 131 103(H) 109(H)  BUN 7.0 - 26.0 mg/dL 12._0 Creatinine 0.7 - 1.3 mg/dL 1.3 1.47 1.22  Sodium 136 - 145 mEq/L 140 141 138  Potassium 3.5 - 5.1 mEq/L 3.9 4.4 4.0  Chloride 96 - 112 mEq/L - 105 107  CO2 22 - 29 mEq/L _1 Calcium 8.4 - 10.4 mg/dL 9.3 9.6 8.3(L)  Total Protein 6.4 - 8.3  g/dL 7.0 7.1 -  Total Bilirubin 0.20 - 1.20 mg/dL 0.43 0.8 -  Alkaline Phos 40 - 150 U/L 80 74 -  AST 5 - 34 U/L 16 18 -  ALT 0 - 55 U/L 23 22 -   CBC CBC Latest Ref Rng & Units 04/28/2016 03/16/2016 01/12/2016  WBC 4.0 - 10.3 10e3/uL 8.5 9.2 8.4  Hemoglobin 13.0 - 17.1 g/dL 13.8 14.4 12.8(L)  Hematocrit 38.4 - 49.9 % 41.2 43.6 38.3(L)  Platelets 140 - 400 10e3/uL 262 277.0 239    PATHOLOGY:  Diagnosis 1. Appendix, Other than Incidental - BENIGN APPENDICEAL TISSUE WITH REACTIVE LYMPHOID HYPERPLASIA AND FIBROUS OBLITERANS. - NO EVIDENCE OF CARCINOID TUMOR. - NO EVIDENCE OF ACTIVE INFLAMMATION OR NEOPLASIA. 2. Small intestine, resection for tumor, ileal with ileal mass, mesenteric mass - WELL DIFFERENTIATED NEUROENDOCRINE TUMOR (CARCINOID TUMOR), INVADING THROUGH THE MUSCULARIS PROPRIA INTO SUBSEROSAL TISSUE. - ANGIOLYMPHATIC INVASION PRESENT. - THREE OF SIX LYMPH NODES, POSITIVE FOR CARCINOID TUMOR (3/6). - RESECTION MARGINS, NEGATIVE FOR ATYPIA OR MALIGNANCY. Microscopic Comment 2. 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 Histologic Grade : G1, Low grade Mitotic Rate < 2/10 high-power fields (HIGH POWER FIELD), Ki-67: <3% Microscopic Tumor Extension: Invading through the muscularis propria into subserosal tissue. Margins: Negative Proximal Margin: 8 cm Distal Margin: 19.5 cm Mesenteric (Radial) Margin : 10 cm from the mucosa lesion and 0.1 cm from the positive mesenteric lymph node, please see comment for details. Distance to the closest margin: please see comment for details.Lymph-Vascular Invasion: Present Perineural Invasion: Not identified Lymph nodes: number examined 6; number positive: 3. TNM Staging: pT3, pN1 1 of 3 FINAL for Mcmillan, Leslie  ([id number]) Microscopic Comment(continued) Additional findings: N/A Comments: The mucosal mass in the ileum measures 1.5 cm. The tumor cells invade through the muscularis propria into the subserosal tissue without penetration of the inked serosa.Immunohistochemical stains were performed and Ki-67 rate is less than 3%. 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 The mucosa tumor is 10 cm from the mesenteric resection margin. The tumor in the partially replaced lymph node is 0.1 cm from the inked mesenteric margin. The case was discussed with Dr. Neysa Bonito on 08/13/2013. (HCL:kh 08/13/13) H. CATHERINE LI MDPathologist, Electronic Signature(Case signed 08/14/2013)  RADIOGRAPHY:  Renal US 01/12/2016 IMPRESSION: Normal ultrasound appearance of the kidneys and bladder.  MR ABDOMEN W WO CONTRAST 11/06/2013  CLINICAL DATA: Carcinoid tumor of ileum. Surgery 6/15. Intermittent abdominal pain. EXAM: MRI ABDOMEN WITHOUT AND WITH CONTRAST  TECHNIQUE: Multiplanar multisequence MR imaging of the abdomen was performed both before and after the administration of intravenous contrast.  CONTRAST: 37m MULTIHANCE GADOBENATE DIMEGLUMINE 529 MG/ML IV SOLN 18 cc MultiHance  COMPARISON: 05/30/2013 and CT of 05/05/2013.  FINDINGS: Normal heart size without pericardial or pleural effusion. A tiny cyst in the right lobe of the liver on image 28 of series 8. No suspicious liver lesions and no abnormal foci of arterial hyper enhancement. Normal spleen, stomach, pancreas, gallbladder, biliary tract, adrenal glands, kidneys. No abdominal adenopathy or ascites. Normal caliber of abdominal bowel loops. Small jejunal mesenteric nodes are likely reactive. No evidence omental/peritoneal disease. IMPRESSION: No acute process or evidence of metastatic disease within the abdomen. Electronically Signed  By: KAbigail MiyamotoM.D. On: 11/06/2013 11:31  CT  renal stone study 05/12/2015 IMPRESSION: 2 mm stone in the distal left ureter with moderate proximal obstruction. Additional tiny intrarenal nonobstructing stones on the right.  ASSESSMENT AND PLAN:  LJoakim Huesmanis a 46y.o. male with a history of LOW GRADE CARCINOID s/p resection on 08/12/2013.    1.  Carcinoid of the Ileum s/p resection, Stage III (pT3,pN1) -We previously reviewed the natural history of small bowel carcinoid tumor, and the possibility of later recurrence.  -We again discussed surveillance plan. -His clinically doing well, still has mild diarrhea and occasional abdominal cramps, last CT scan of abdomen (renal stone protocol) was negative for recurrence in 05/2015. -Lab reviewed, CBC and CMP are within normal limits, chromogranin A level is still pending -I'll continue surveillance. -I have ordered a repeat CT to be done in the next month. We'll continue surveillance CT scan once a year for up to 5 years. -He has not had repeat colonoscopy after his surgery, I'll send a message Dr. PHenrene Pastor and I encouraged patient to call Dr. PBlanch Mediaoffice for appointment.  2.  Chronic diarrhea, abdominal cramps  -Likely secondary to surgery.  -I encouraged him to use Imodium as needed -Avoid high-fiber diet -Physical therapy   3. PTSD, chronic Headaches, Insomnia, fatigue -- [facility]-He is on trazodone and Zoloft  4. HTN -He is on losartan, but not compliant, he often forgets to take in the morning before he leaves the house for work. -I suggest him to take a medication in the evening  5. Obesity -He is trying to lose some weight and eat healthy diet. He has spoke to a nutritionist. -He has not been very active, I encouraged him to exercise.  Plan: -Ordered CT to be done within the next month. I will call him with these results. -He will call Dr. PLaverle Patteroffice for repeated colonoscopy   -Return in 6 months with lab   All questions were answered.  The patient knows to call the clinic with any problems, questions or concerns. We can certainly see the patient much sooner if necessary.  I spent 20 minutes counseling the patient face to face. The total time spent in the appointment was 25 minutes.  This document serves as a record of services personally performed by YTruitt Merle MD. It was created on her behalf by JMartiniqueCasey, a trained medical scribe. The creation of this record is based on the scribe's personal observations and the provider's statements to them. This document has been checked and approved by the attending provider.  I have reviewed the above documentation for accuracy and completeness and I agree with the above.  FTruitt Merle 04/28/2016

## 2016-04-28 ENCOUNTER — Telehealth: Payer: Self-pay | Admitting: Hematology

## 2016-04-28 ENCOUNTER — Encounter: Payer: Self-pay | Admitting: Hematology

## 2016-04-28 ENCOUNTER — Other Ambulatory Visit (HOSPITAL_BASED_OUTPATIENT_CLINIC_OR_DEPARTMENT_OTHER): Payer: BLUE CROSS/BLUE SHIELD

## 2016-04-28 ENCOUNTER — Ambulatory Visit (HOSPITAL_BASED_OUTPATIENT_CLINIC_OR_DEPARTMENT_OTHER): Payer: BLUE CROSS/BLUE SHIELD | Admitting: Hematology

## 2016-04-28 VITALS — BP 132/78 | HR 89 | Temp 98.6°F | Resp 17 | Ht 69.0 in | Wt 244.4 lb

## 2016-04-28 DIAGNOSIS — R5383 Other fatigue: Secondary | ICD-10-CM

## 2016-04-28 DIAGNOSIS — R197 Diarrhea, unspecified: Secondary | ICD-10-CM

## 2016-04-28 DIAGNOSIS — E669 Obesity, unspecified: Secondary | ICD-10-CM | POA: Diagnosis not present

## 2016-04-28 DIAGNOSIS — D3A012 Benign carcinoid tumor of the ileum: Secondary | ICD-10-CM

## 2016-04-28 DIAGNOSIS — C7A012 Malignant carcinoid tumor of the ileum: Secondary | ICD-10-CM

## 2016-04-28 DIAGNOSIS — R51 Headache: Secondary | ICD-10-CM | POA: Diagnosis not present

## 2016-04-28 DIAGNOSIS — G4700 Insomnia, unspecified: Secondary | ICD-10-CM

## 2016-04-28 DIAGNOSIS — R109 Unspecified abdominal pain: Secondary | ICD-10-CM

## 2016-04-28 LAB — COMPREHENSIVE METABOLIC PANEL
ALBUMIN: 3.7 g/dL (ref 3.5–5.0)
ALK PHOS: 80 U/L (ref 40–150)
ALT: 23 U/L (ref 0–55)
AST: 16 U/L (ref 5–34)
Anion Gap: 10 mEq/L (ref 3–11)
BILIRUBIN TOTAL: 0.43 mg/dL (ref 0.20–1.20)
BUN: 12.7 mg/dL (ref 7.0–26.0)
CO2: 24 mEq/L (ref 22–29)
CREATININE: 1.3 mg/dL (ref 0.7–1.3)
Calcium: 9.3 mg/dL (ref 8.4–10.4)
Chloride: 106 mEq/L (ref 98–109)
EGFR: 80 mL/min/{1.73_m2} — ABNORMAL LOW (ref >90)
GLUCOSE: 131 mg/dL (ref 70–140)
Potassium: 3.9 mEq/L (ref 3.5–5.1)
SODIUM: 140 meq/L (ref 136–145)
TOTAL PROTEIN: 7 g/dL (ref 6.4–8.3)

## 2016-04-28 LAB — CBC WITH DIFFERENTIAL/PLATELET
BASO%: 0.8 % (ref 0.0–2.0)
Basophils Absolute: 0.1 10*3/uL (ref 0.0–0.1)
EOS ABS: 0.1 10*3/uL (ref 0.0–0.5)
EOS%: 1.6 % (ref 0.0–7.0)
HCT: 41.2 % (ref 38.4–49.9)
HEMOGLOBIN: 13.8 g/dL (ref 13.0–17.1)
LYMPH%: 33.2 % (ref 14.0–49.0)
MCH: 27.9 pg (ref 27.2–33.4)
MCHC: 33.4 g/dL (ref 32.0–36.0)
MCV: 83.6 fL (ref 79.3–98.0)
MONO#: 0.7 10*3/uL (ref 0.1–0.9)
MONO%: 8.1 % (ref 0.0–14.0)
NEUT%: 56.3 % (ref 39.0–75.0)
NEUTROS ABS: 4.8 10*3/uL (ref 1.5–6.5)
Platelets: 262 10*3/uL (ref 140–400)
RBC: 4.93 10*6/uL (ref 4.20–5.82)
RDW: 14 % (ref 11.0–14.6)
WBC: 8.5 10*3/uL (ref 4.0–10.3)
lymph#: 2.8 10*3/uL (ref 0.9–3.3)

## 2016-04-28 NOTE — Telephone Encounter (Signed)
2 bottles of contrast and copy of instructions was given to patient per 04/28/16 los. Appointments scheduled per 04/28/16 los. Patient was given a copy of the AVS report and appointment schedule, per 04/28/16 los.

## 2016-05-01 LAB — CHROMOGRANIN A: CHROMOGRAN A: 2 nmol/L (ref 0–5)

## 2016-05-02 ENCOUNTER — Telehealth: Payer: Self-pay | Admitting: *Deleted

## 2016-05-02 NOTE — Telephone Encounter (Signed)
-----   Message from Truitt Merle, MD sent at 05/01/2016  9:28 PM EST ----- Please let pt know his lab result, thanks.   Truitt Merle  05/01/2016

## 2016-05-02 NOTE — Telephone Encounter (Signed)
Spoke with pt and informed pt of lab results as per Dr. Feng's instructions.  Pt voiced understanding.  

## 2016-06-06 ENCOUNTER — Encounter: Payer: BLUE CROSS/BLUE SHIELD | Attending: Family Medicine | Admitting: Registered"

## 2016-06-06 DIAGNOSIS — Z713 Dietary counseling and surveillance: Secondary | ICD-10-CM | POA: Diagnosis present

## 2016-06-06 DIAGNOSIS — E669 Obesity, unspecified: Secondary | ICD-10-CM | POA: Insufficient documentation

## 2016-06-06 DIAGNOSIS — Z6839 Body mass index (BMI) 39.0-39.9, adult: Secondary | ICD-10-CM | POA: Insufficient documentation

## 2016-06-06 NOTE — Progress Notes (Signed)
Medical Nutrition Therapy:  Appt start time:  0930 end time:  1000.   Assessment:  Primary concerns today: Pt is here for a follow-up visit. 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 Pt had an appointment with his oncology doctor after our last visit and he was expecting to get a referral to investigate issues he has been having with his bowels.  Pt reports he has been feeling better with diet changes, not as much GI issues. Pt reports switching to whole grain flat bread and includes more foods such as sweet potatoes, greens, nuts. Pt reported that he had red meat/high fat meal and he was up and down all night with pain and bowel issues. Pt reports he no longer keeps sweet snacks by his bed and has only had one sweet late night snack lately.  Preferred Learning Style:   No preference indicated   Learning Readiness:   Ready  MEDICATIONS: reviewed. He has had changes to medications for headaches and sleep, but Pt did not remember names of meds.   DIETARY INTAKE:  Usual eating pattern includes 3 meals and 1-2 snacks per day.  Avoided foods include pork.    24-hr recall:  B ( AM): none OR almonds and health bars Snk ( AM):  L ( PM): fruit, sandwiches, nuts OR [employer] OR hotdogs, chips at convenience Snk ( PM): chips D ( PM): burgers, hotdogs, chicken OR [place] burgers Snk ( PM): fruit usually Beverages: coffee (was cutting out), water, when working drinks Monsters (cutting back) at work for [employer]  Usual physical activity: Works for [employer] and gets a lot of activity at work, but not much in the off season.   Estimated energy needs: 2000 calories 225 g carbohydrates 150 g protein 56 g fat  Progress Towards Goal(s):  In progress.   Nutritional Diagnosis:  [place]-3.3 Overweight/obesity As related to inconsistent meals and frequent snacking on calorie dense snacks.  As  evidenced by diet recall and BMI of 34.8.    Intervention:  Nutrition Education. Discussed food groups, portion size, and eating balanced meals throughout the day. Educated patient on reasons for chewing well and eating slowly. Also the importance of regular exercise for both physical and mental health.   Plan: Consider changing the OTC B12 for a B complex (pt reports now taking and helping energy) Consider using your treadmill 30 min daily as tolerated (can work up to this amount) Consider eating 3 meals a day (in progress) Consider having fish 2-3 week (not doing) Consider adding nuts to your diet, especially almonds & peanuts (in progress) Consider adding oatmeal to your diet, can help with cholesterol Whole grain bread, pasta (in progress) Consider veggies for snacks Consider allowing at 20 min for a meal and eat slowly  Teaching Method Utilized: Visual Auditory  Handouts given during visit include:  Snack sheet  Barriers to learning/adherence to lifestyle change: none  Demonstrated degree of understanding via:  Teach Back   Monitoring/Evaluation:  Dietary intake, exercise, and body weight in 6 week(s)

## 2016-07-04 ENCOUNTER — Ambulatory Visit: Payer: BLUE CROSS/BLUE SHIELD | Admitting: Registered"

## 2016-09-11 ENCOUNTER — Encounter: Payer: BLUE CROSS/BLUE SHIELD | Admitting: Family Medicine

## 2016-09-11 DIAGNOSIS — Z0289 Encounter for other administrative examinations: Secondary | ICD-10-CM

## 2016-10-26 ENCOUNTER — Inpatient Hospital Stay: Payer: BLUE CROSS/BLUE SHIELD | Attending: Hematology | Admitting: Hematology

## 2016-10-26 ENCOUNTER — Telehealth: Payer: Self-pay | Admitting: Hematology

## 2016-10-26 ENCOUNTER — Inpatient Hospital Stay (HOSPITAL_BASED_OUTPATIENT_CLINIC_OR_DEPARTMENT_OTHER): Payer: BLUE CROSS/BLUE SHIELD

## 2016-10-26 VITALS — BP 148/96 | HR 89 | Temp 98.8°F | Resp 20 | Ht 69.0 in | Wt 234.8 lb

## 2016-10-26 DIAGNOSIS — R197 Diarrhea, unspecified: Secondary | ICD-10-CM

## 2016-10-26 DIAGNOSIS — C7A012 Malignant carcinoid tumor of the ileum: Secondary | ICD-10-CM

## 2016-10-26 DIAGNOSIS — D3A012 Benign carcinoid tumor of the ileum: Secondary | ICD-10-CM

## 2016-10-26 DIAGNOSIS — R109 Unspecified abdominal pain: Secondary | ICD-10-CM

## 2016-10-26 LAB — CBC WITH DIFFERENTIAL/PLATELET
BASO%: 0.4 % (ref 0.0–2.0)
Basophils Absolute: 0 10*3/uL (ref 0.0–0.1)
EOS%: 1.4 % (ref 0.0–7.0)
Eosinophils Absolute: 0.1 10*3/uL (ref 0.0–0.5)
HEMATOCRIT: 42.2 % (ref 38.4–49.9)
HGB: 14 g/dL (ref 13.0–17.1)
LYMPH%: 28.1 % (ref 14.0–49.0)
MCH: 27.9 pg (ref 27.2–33.4)
MCHC: 33 g/dL (ref 32.0–36.0)
MCV: 84.5 fL (ref 79.3–98.0)
MONO#: 0.7 10*3/uL (ref 0.1–0.9)
MONO%: 8.5 % (ref 0.0–14.0)
NEUT%: 61.6 % (ref 39.0–75.0)
NEUTROS ABS: 5.4 10*3/uL (ref 1.5–6.5)
Platelets: 260 10*3/uL (ref 140–400)
RBC: 5 10*6/uL (ref 4.20–5.82)
RDW: 14 % (ref 11.0–14.6)
WBC: 8.8 10*3/uL (ref 4.0–10.3)
lymph#: 2.5 10*3/uL (ref 0.9–3.3)

## 2016-10-26 LAB — COMPREHENSIVE METABOLIC PANEL
ALBUMIN: 3.7 g/dL (ref 3.5–5.0)
ALK PHOS: 78 U/L (ref 40–150)
ALT: 45 U/L (ref 0–55)
AST: 27 U/L (ref 5–34)
Anion Gap: 8 mEq/L (ref 3–11)
BILIRUBIN TOTAL: 0.4 mg/dL (ref 0.20–1.20)
BUN: 9.2 mg/dL (ref 7.0–26.0)
CALCIUM: 9.5 mg/dL (ref 8.4–10.4)
CO2: 28 mEq/L (ref 22–29)
Chloride: 106 mEq/L (ref 98–109)
Creatinine: 1.4 mg/dL — ABNORMAL HIGH (ref 0.7–1.3)
EGFR: 69 mL/min/{1.73_m2} — AB (ref >90)
Glucose: 100 mg/dl (ref 70–140)
POTASSIUM: 4.3 meq/L (ref 3.5–5.1)
Sodium: 141 mEq/L (ref 136–145)
TOTAL PROTEIN: 7.1 g/dL (ref 6.4–8.3)

## 2016-10-26 NOTE — Telephone Encounter (Signed)
Gave patient avs report and calendar for February 2019 appointment

## 2016-10-26 NOTE — Progress Notes (Signed)
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 Johney Maine.     He reports that in winter of 2012, he abdominal pains described as tense muscle spasms, sharp pains that were severe in quality.  He went to the emergency room at [facility] and he reports that he was told that he inflammation and provided antibiotics and ibuprofen. CT of abdomen on 10/25/2010 showed that there was mild inflammation surrounding the proximal jejunum near the ligament of Treitz, findings which could represent focal enteritis. He had later complained of some severe headaches causing an emergency room visit [facility] and he had a CT of head in January 2015 which showed no acute intracranial abnormality.   He notes that there was slow progression of his abdominal pain over time.  Eventually, due to it recurrence and progression, he presented to [facility] ER and CT of abdomen and pelvis was unrevealing on 05/05/2013.  Due to progressive abdominal pain and rectal bleeding, he was referred to [facility] (Dr. Hilarie Fredrickson).  He had a Meckels scan on 05/22/2013  and MRI (05/30/2013) and capsule endoscopy.  Meckel's scan was without scintigraphic evidence of ectopic gastric mucosa and demonstrated that there was a subtle round soft tissue mass within the right lower quadrant which was difficult to distinguish from the adjacent loops of small bowel. This small mass measured 2.1 x 2.4 cm on 10/25/2010 increased slowly to 2.8 x 2.7 cm on CT of 05/05/2013. Common differential for this small slow growing lesion would be a  carcinoid tumor or a gastrointestinal stromal tumor. No retractile pattern typical of carcinoid.    MRI of abdomen and pelvis on 05/30/2013 showed a diffusely enhacing 3.0 x 2.0 x 2.2 cm mass in the  new mesentery with close associated mesenteric vessels but the lesion did not appear to invade the small bowel.  As noted on the Mecke's can, top differential diagnostic consideration would be GIST or carcinoid tumor.  Lymphoma was not completely excluded. Of note, he also had several jejunal loops that were somewhat featureless which could be due to fold thickening or effacement.   Based on results of these studies, he was referred to to Surgery (Dr. Johney Maine).  He was admitted for mesenteric mass and present carcinoid and underwent laparoscopic assisted resection of ileum and ymph nodes on 08/12/2013.  There were no complications.  His pathology noted carcinoid with 3/6 lymph nodes positive.  Medical oncology consultation was obtained and patient seen by Dr Juliann Mule on 09/04/2013.     Current treatment: Observation  INTERVAL HISTORY: He returns for follow up. He has been doing ok. He still experiences some constipation and diarrhea that happens around the same time, about 5x daily. He feels bloated and has some nausea. Denies any vomiting for the past 2 weeks and SOB. His appetite is good and his diet is better since seeing nutritionist.     PAST MEDICAL HISTORY:  Past Medical History:  Diagnosis Date  . Anxiety   . Blood in stool 5--31-2015  . Depression   . Diverticulitis   . Gastroduodenitis   .  GERD (gastroesophageal reflux disease)    no meds for  . Headache   . Hypertension    PAST SURGICAL HISTORY: Past Surgical History:  Procedure Laterality Date  . BOWEL RESECTION N/A 08/12/2013   Procedure: SMALL BOWEL ILEAL RESECTION;  Surgeon: Adin Hector, MD;  Location: [facility] ORS;  Service: General;  Laterality: N/A;  . COLONOSCOPY N/A 05/09/2013   Procedure: COLONOSCOPY;  Surgeon: Irene Shipper, MD;   Location: [facility] ENDOSCOPY;  Service: Endoscopy;  Laterality: N/A;  . ESOPHAGOGASTRODUODENOSCOPY N/A 05/09/2013   Procedure: ESOPHAGOGASTRODUODENOSCOPY (EGD);  Surgeon: Irene Shipper, MD;  Location: [facility];  Service: Endoscopy;  Laterality: N/A;  possible egd depending on colon results  . LAPAROSCOPIC APPENDECTOMY N/A 08/12/2013   Procedure: LAPAROSCOPIC ASSISTED APPENDECTOMY;  Surgeon: Adin Hector, MD;  Location: [facility] ORS;  Service: General;  Laterality: N/A;  . TONSILLECTOMY  as child   CURRENT MEDICATIONS: has a current medication list which includes the following prescription(s): atorvastatin, losartan, omeprazole, sertraline, and trazodone.  ALLERGIES: Patient has no known allergies.  SOCIAL HISTORY:  reports that he has been smoking Cigarettes.  He has a 5.00 pack-year smoking history. He has never used smokeless tobacco. He reports that he drinks alcohol. He reports that he uses drugs, including Marijuana.  FAMILY HISTORY: family history includes Anuerysm in his mother; Diabetes in his paternal aunt, paternal grandmother, and paternal uncle; Hypertension in his paternal aunt and paternal grandmother; Liver disease in his paternal uncle.  REVIEW OF SYSTEMS:  Constitutional: Denies fevers, chills or abnormal weight loss (+) occasional hot flashes Eyes: Denies blurriness of vision Ears, nose, mouth, throat, and face: Denies mucositis or sore throat Respiratory: Denies cough, dyspnea or wheezes Cardiovascular: Denies palpitation, chest discomfort or lower extremity swelling Gastrointestinal:  Denies nausea, heartburn; diarrhea as noted in HPI. (+)diarrhea and constipation, abdominal bloating Skin: Denies abnormal skin rashes Lymphatics: Denies new lymphadenopathy or easy bruising Neurological:Denies numbness, tingling or new weaknesses Behavioral/Psych: Mood is stable, no new changes  ABD: (+)soreness in right side flank All other systems were reviewed with the patient and are  negative.  PHYSICAL EXAM:    height is _0  (1.753 m) and weight is 234 lb 12.8 oz (106.5 kg). His oral temperature is 98.8 F (37.1 C). His blood pressure is 148/96 (abnormal) and his pulse is 89. His respiration is 20 and oxygen saturation is 100%.    GENERAL:alert, no distress and comfortable; anxious, well developed and well nourished.  SKIN: skin color, texture, turgor are normal, no rashes or significant lesions EYES: normal, Conjunctiva are pink and non-injected, sclera clear OROPHARYNX:no exudate, no erythema and lips, buccal mucosa, and tongue normal  NECK: supple, thyroid normal size, non-tender, without nodularity LYMPH:  no palpable lymphadenopathy in the cervical, axillary or inguinal LUNGS: clear to auscultation and percussion with normal breathing effort HEART: regular rate & rhythm and no murmurs and no lower extremity edema ABDOMEN:abdomen soft, mild tenderness at mid and left upper quadrant, normal bowel sounds; abdominal mid-line incision well healed; Diffuse abdominal tenderness, more on right.  Musculoskeletal:no cyanosis of digits and no clubbing NEURO: alert & oriented x 3 with fluent speech, no focal motor/sensory deficits  LABORATORY DATA:    CMP Latest Ref Rng & Units 10/26/2016 04/28/2016 03/16/2016  Glucose 70 - 140 mg/dl 100 131 103(H)  BUN 7.0 - 26.0 mg/dL 9.2 12.7 21  Creatinine 0.7 - 1.3 mg/dL 1.4(H) 1.3 1.47  Sodium 136 - 145 mEq/L 141 140 141  Potassium 3.5 - 5.1 mEq/L  4.3 3.9 4.4  Chloride 96 - 112 mEq/L - - 105  CO2 22 - 29 mEq/L _0 Calcium 8.4 - 10.4 mg/dL 9.5 9.3 9.6  Total Protein 6.4 - 8.3 g/dL 7.1 7.0 7.1  Total Bilirubin 0.20 - 1.20 mg/dL 0.40 0.43 0.8  Alkaline Phos 40 - 150 U/L 78 80 74  AST 5 - 34 U/L _1 ALT 0 - 55 U/L 45 23 22   CBC CBC Latest Ref Rng & Units 10/26/2016 04/28/2016 03/16/2016  WBC 4.0 - 10.3 10e3/uL 8.8 8.5 9.2  Hemoglobin 13.0 - 17.1 g/dL 14.0 13.8 14.4  Hematocrit 38.4 - 49.9 % 42.2 41.2 43.6  Platelets  140 - 400 10e3/uL 260 262 277.0    PATHOLOGY:  Diagnosis 1. Appendix, Other than Incidental - BENIGN APPENDICEAL TISSUE WITH REACTIVE LYMPHOID HYPERPLASIA AND FIBROUS OBLITERANS. - NO EVIDENCE OF CARCINOID TUMOR. - NO EVIDENCE OF ACTIVE INFLAMMATION OR NEOPLASIA. 2. Small intestine, resection for tumor, ileal with ileal mass, mesenteric mass - WELL DIFFERENTIATED NEUROENDOCRINE TUMOR (CARCINOID TUMOR), INVADING THROUGH THE MUSCULARIS PROPRIA INTO SUBSEROSAL TISSUE. - ANGIOLYMPHATIC INVASION PRESENT. - THREE OF SIX LYMPH NODES, POSITIVE FOR CARCINOID TUMOR (3/6). - RESECTION MARGINS, NEGATIVE FOR ATYPIA OR MALIGNANCY. Microscopic Comment 2. 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 Histologic Grade : G1, Low grade Mitotic Rate < 2/10 high-power fields (HIGH POWER FIELD), Ki-67: <3% Microscopic Tumor Extension: Invading through the muscularis propria into subserosal tissue. Margins: Negative Proximal Margin: 8 cm Distal Margin: 19.5 cm Mesenteric (Radial) Margin : 10 cm from the mucosa lesion and 0.1 cm from the positive mesenteric lymph node, please see comment for details. Distance to the closest margin: please see comment for details.Lymph-Vascular Invasion: Present Perineural Invasion: Not identified Lymph nodes: number examined 6; number positive: 3. TNM Staging: pT3, pN1 1 of 3 FINAL for CHANSE, KAGEL ([id number]) Microscopic Comment(continued) Additional findings: N/A Comments: The mucosal mass in the ileum measures 1.5 cm. The tumor cells invade through the muscularis propria into the subserosal tissue without penetration of the inked serosa.Immunohistochemical stains were performed and Ki-67 rate is less than 3%. The overall findings are diagnostic  for well differentiated (low grade) neuroendocrine tumor of small bowel (carcinoid tumor, G1). The mucosa tumor is 10 cm from the mesenteric resection margin. The tumor in the partially replaced lymph node is 0.1 cm from the inked mesenteric margin. The case was discussed with Dr. Neysa Bonito on 08/13/2013. (HCL:kh 08/13/13) H. CATHERINE LI MDPathologist, Electronic Signature(Case signed 08/14/2013)  RADIOGRAPHY:  Renal US 01/12/2016 IMPRESSION: Normal ultrasound appearance of the kidneys and bladder.  MR ABDOMEN W WO CONTRAST 11/06/2013  CLINICAL DATA: Carcinoid tumor of ileum. Surgery 6/15. Intermittent abdominal pain. EXAM: MRI ABDOMEN WITHOUT AND WITH CONTRAST  TECHNIQUE: Multiplanar multisequence MR imaging of the abdomen was performed both before and after the administration of intravenous contrast.  CONTRAST: 76m MULTIHANCE GADOBENATE DIMEGLUMINE 529 MG/ML IV SOLN 18 cc MultiHance  COMPARISON: 05/30/2013 and CT of 05/05/2013.  FINDINGS: Normal heart size without pericardial or pleural effusion. A tiny cyst in the right lobe of the liver on image 28 of series 8. No suspicious liver lesions and no abnormal foci of arterial hyper enhancement. Normal spleen, stomach, pancreas, gallbladder, biliary tract, adrenal glands, kidneys. No abdominal adenopathy or ascites. Normal caliber of abdominal bowel loops. Small jejunal mesenteric  nodes are likely reactive. No evidence omental/peritoneal disease. IMPRESSION: No acute process or evidence of metastatic disease within the abdomen. Electronically Signed  By: Abigail Miyamoto M.D. On: 11/06/2013 11:31  CT renal stone study 05/12/2015 IMPRESSION: 2 mm stone in the distal left ureter with moderate proximal obstruction. Additional tiny intrarenal nonobstructing stones on the right.  ASSESSMENT AND PLAN:  Leslie Mcmillan is a 46 y.o. male with a history of LOW GRADE CARCINOID s/p resection on 08/12/2013.    1.  Carcinoid of the Ileum s/p resection, Stage III  (pT3,pN1) -We previously reviewed the natural history of small bowel carcinoid tumor, and the possibility of later recurrence.  -We again discussed surveillance plan. - last CT scan of abdomen (renal stone protocol) was negative for recurrence in 05/2015. -Lab reviewed, CBC and CMP are within normal limits, chromogranin A level is still pending -I'll continue surveillance. -I have ordered a repeat CT to be done in the next month. We'll continue surveillance CT scan once a year for up to 5 years. - repeat scan in 2-3 weeks. I have encouraged him to call for results.if scan is normal, he will follow up in 6 months -His clinically doing well, still has mild diarrhea and occasional abdominal cramps, no cranial consult for recurrence. -Continue surveillance  2.  Chronic diarrhea, abdominal cramps  -Likely secondary to surgery.  -I encouraged him to use Imodium as needed -Avoid high-fiber diet -Physical therapy  I suggested he try Probiotics to help with bowel movement   3. PTSD, chronic Headaches, Insomnia, fatigue -- follow up with his psychiatrist at [facility] -He is on trazodone and Zoloft  4. HTN -He is on losartan, but not compliant, he often forgets to take in the morning before he leaves the house for work. -I suggest him to take a medication in the evening  5. Obesity -He is trying to lose some weight and eat healthy diet. He has spoke to a nutritionist. -He has not been very active, I encouraged him to exercise.  Plan: -Repeat surveillance CT chest, abdomen and pelvis scan in 2-3 weeks. He will call me after his scan to go over results. If normal, follow up in 6 months with labs - urine test on next visit     All questions were answered. The patient knows to call the clinic with any problems, questions or concerns. We can certainly see the patient much sooner if necessary.  I spent 20 minutes counseling the patient face to face. The total time spent in the appointment was 25  minutes.  This document serves as a record of services personally performed by Truitt Merle, MD. It was created on her behalf by Brandt Loosen, a trained medical scribe. The creation of this record is based on the scribe's personal observations and the provider's statements to them. This document has been checked and approved by the attending provider. .  I have reviewed the above documentation for accuracy and completeness and I agree with the above.  Truitt Merle  10/26/2016

## 2016-10-27 ENCOUNTER — Encounter: Payer: Self-pay | Admitting: Hematology

## 2016-10-27 LAB — CHROMOGRANIN A: CHROMOGRAN A: 2 nmol/L (ref 0–5)

## 2016-11-23 ENCOUNTER — Encounter: Payer: Self-pay | Admitting: Hematology

## 2016-12-07 ENCOUNTER — Telehealth: Payer: Self-pay | Admitting: *Deleted

## 2016-12-07 NOTE — Telephone Encounter (Signed)
Unsuccessfully trying to reach [employer].  Message left for LaTonya to call back with contact info.

## 2016-12-08 ENCOUNTER — Encounter: Payer: Self-pay | Admitting: *Deleted

## 2016-12-11 ENCOUNTER — Telehealth: Payer: Self-pay | Admitting: Hematology

## 2016-12-11 NOTE — Telephone Encounter (Signed)
Faxed records to Dr. Marjo Bicker [phone]

## 2016-12-12 ENCOUNTER — Emergency Department (HOSPITAL_COMMUNITY)
Admission: EM | Admit: 2016-12-12 | Discharge: 2016-12-12 | Disposition: A | Discharge status: Home / Self Care | Payer: BLUE CROSS/BLUE SHIELD | Attending: Emergency Medicine | Admitting: Emergency Medicine

## 2016-12-12 ENCOUNTER — Emergency Department (HOSPITAL_COMMUNITY): Payer: BLUE CROSS/BLUE SHIELD

## 2016-12-12 ENCOUNTER — Encounter (HOSPITAL_COMMUNITY): Payer: Self-pay | Admitting: Emergency Medicine

## 2016-12-12 DIAGNOSIS — F17210 Nicotine dependence, cigarettes, uncomplicated: Secondary | ICD-10-CM | POA: Insufficient documentation

## 2016-12-12 DIAGNOSIS — I10 Essential (primary) hypertension: Secondary | ICD-10-CM | POA: Insufficient documentation

## 2016-12-12 DIAGNOSIS — M25561 Pain in right knee: Secondary | ICD-10-CM | POA: Insufficient documentation

## 2016-12-12 DIAGNOSIS — Z79899 Other long term (current) drug therapy: Secondary | ICD-10-CM | POA: Insufficient documentation

## 2016-12-12 MED ORDER — DICLOFENAC SODIUM 50 MG PO TBEC: 50.0000 mg | DELAYED_RELEASE_TABLET | Freq: Two times a day (BID) | ORAL | 0 refills | Status: DC

## 2016-12-12 MED ORDER — OXYCODONE-ACETAMINOPHEN 5-325 MG PO TABS
1.0000 | ORAL_TABLET | Freq: Once | ORAL | Status: AC
  Administered 2016-12-12: 1 via ORAL
  Filled 2016-12-12: qty 1

## 2016-12-12 MED ORDER — IBUPROFEN 200 MG PO TABS
600.0000 mg | ORAL_TABLET | Freq: Once | ORAL | Status: AC
  Administered 2016-12-12: 600 mg via ORAL
  Filled 2016-12-12: qty 3

## 2016-12-12 NOTE — ED Triage Notes (Signed)
Patient complaining of right knee pain. Patient was moving a dresser on Saturday and felt his knee pop. He states he dont know what happened.

## 2016-12-12 NOTE — ED Provider Notes (Signed)
[facility] DEPT Provider Note   CSN: [id number] Arrival date & time: 12/12/16  1906     History   Chief Complaint Chief Complaint  Patient presents with  . Knee Pain    HPI Leslie Mcmillan is a 46 y.o. male who presents to the ED with knee pain. Patient reports he was moving a Ecologist 3 days ago and felt a pop and the knee has continued to hurt. The pain is in the right knee. Patient tried to go to work yesterday but the pain was so bad he had to leave. He has not taken any medication for the pain.   The history is provided by the patient. No language interpreter was used.  Knee Pain   This is a new problem. The current episode started more than 2 days ago. The problem occurs constantly. The problem has been gradually worsening. The pain is present in the right knee. The pain is moderate. Pertinent negatives include no numbness. He has tried nothing for the symptoms.    Past Medical History:  Diagnosis Date  . Anxiety   . Blood in stool 5--31-2015  . Depression   . Diverticulitis   . Gastroduodenitis   . GERD (gastroesophageal reflux disease)    no meds for  . Headache   . Hypertension     Patient Active Problem List   Diagnosis Date Noted  . Obesity (BMI 30-39.9) 03/16/2016  . Dog bite of right hand 09/09/2015  . GERD (gastroesophageal reflux disease) 09/09/2015  . Hyperlipidemia 03/12/2015  . Knee pain, bilateral 08/13/2014  . Physical exam 07/14/2014  . Bilateral carpal tunnel syndrome 07/13/2014  . Diarrhea 03/26/2014  . Dental abscess 12/22/2013  . Allergic rhinitis 11/20/2013  . HTN (hypertension) 11/20/2013  . Insomnia 11/13/2013  . Migraine headache without aura 11/13/2013  . Condyloma acuminatum of penis 08/12/2013  . 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 Duodenitis without bleeding 05/09/2013  . Diverticulosis of colon without hemorrhage 05/09/2013    Past Surgical History:  Procedure Laterality Date    . BOWEL RESECTION N/A 08/12/2013   Procedure: SMALL BOWEL ILEAL RESECTION;  Surgeon: Adin Hector, MD;  Location: [facility]S;  Service: General;  Laterality: N/A;  . COLONOSCOPY N/A 05/09/2013   Procedure: COLONOSCOPY;  Surgeon: Irene Shipper, MD;  Location: [facility]COPY;  Service: Endoscopy;  Laterality: N/A;  . ESOPHAGOGASTRODUODENOSCOPY N/A 05/09/2013   Procedure: ESOPHAGOGASTRODUODENOSCOPY (EGD);  Surgeon: Irene Shipper, MD;  Location: [facility];  Service: Endoscopy;  Laterality: N/A;  possible egd depending on colon results  . LAPAROSCOPIC APPENDECTOMY N/A 08/12/2013   Procedure: LAPAROSCOPIC ASSISTED APPENDECTOMY;  Surgeon: Adin Hector, MD;  Location: [facility]S;  Service: General;  Laterality: N/A;  . TONSILLECTOMY  as child       Home Medications    Prior to Admission medications   Medication Sig Start Date End Date Taking? Authorizing Provider  atorvastatin (LIPITOR) 20 MG tablet Take 1 tablet (20 mg total) by mouth at bedtime. 03/16/15   [name], MD  diclofenac (VOLTAREN) 50 MG EC tablet Take 1 tablet (50 mg total) by mouth 2 (two) times daily. 12/12/16   Ashley Murrain, NP  losartan (COZAAR) 100 MG tablet Take 1 tablet (100 mg total) by mouth daily. 08/13/14   [name], MD  omeprazole (PRILOSEC) 40 MG capsule Take 1 capsule (40 mg total) by mouth daily. 03/16/16   [name], MD  sertraline (ZOLOFT)  50 MG tablet Take 2 tablets (100 mg total) by mouth daily. 04/14/14   Pieter Partridge, DO  traZODone (DESYREL) 50 MG tablet Take 0.5-1 tablets (25-50 mg total) by mouth at bedtime as needed for sleep. 07/14/14   [name], MD    Family History Family History  Problem Relation Age of Onset  . Anuerysm Mother        brain  . Diabetes Paternal Grandmother   . Hypertension Paternal Grandmother   . Diabetes Paternal Uncle   . Diabetes Paternal Aunt   . Liver disease Paternal Uncle   . Hypertension Paternal Aunt     Social History Social History   Substance Use Topics  . Smoking status: Current Every Day Smoker    Packs/day: 0.50    Years: 10.00    Types: Cigarettes  . Smokeless tobacco: Never Used  . Alcohol use 0.0 oz/week     Comment: 2-3 beers some days     Allergies   Patient has no known allergies.   Review of Systems Review of Systems  Constitutional: Negative for chills and fever.  HENT: Negative.   Gastrointestinal: Negative for nausea and vomiting.  Musculoskeletal: Positive for arthralgias.       Right knee pain  Skin: Negative for wound.  Neurological: Negative for weakness and numbness.  Psychiatric/Behavioral: The patient is not nervous/anxious.      Physical Exam Updated Vital Signs BP (!) 159/97 (BP Location: Left Arm)   Pulse 87   Temp 98.6 F (37 C) (Oral)   Resp 18   Ht 5\' 10"  (1.778 m)   Wt 107.4 kg (236 lb 12.8 oz)   SpO2 99%   BMI 33.98 kg/m   Physical Exam  Constitutional: He appears well-developed and well-nourished. No distress.  HENT:  Head: Normocephalic.  Eyes: EOM are normal.  Neck: Neck supple.  Cardiovascular: Normal rate.   Pulmonary/Chest: Effort normal.  Musculoskeletal:       Right knee: He exhibits decreased range of motion (due to pain). He exhibits no swelling, no ecchymosis, no deformity, no laceration, no erythema and normal alignment. Tenderness found.       Legs: Pain with flexion of the knee. Pedal pulse 2+, adequate circulation.  Neurological: He is alert.  Skin: Skin is warm and dry.  Psychiatric: He has a normal mood and affect.  Nursing note and vitals reviewed.    ED Treatments / Results  Labs (all labs ordered are listed, but only abnormal results are displayed) Labs Reviewed - No data to display  Radiology Dg Knee Complete 4 Views Right  Result Date: 12/12/2016 CLINICAL DATA:  Persistent pain after moving furniture 3 days ago. EXAM: RIGHT KNEE - COMPLETE 4+ VIEW COMPARISON:  None. FINDINGS: No evidence of fracture, dislocation, or joint  effusion. No evidence of arthropathy or other focal bone abnormality. Soft tissues are unremarkable. Old fragmentation at the tibial tuberosity incidentally noted. IMPRESSION: Negative. Electronically Signed   By: Andreas Newport M.D.   On: 12/12/2016 20:41    Procedures Procedures (including critical care time)  Medications Ordered in ED Medications  oxyCODONE-acetaminophen (PERCOCET/ROXICET) 5-325 MG per tablet 1 tablet (not administered)  ibuprofen (ADVIL,MOTRIN) tablet 600 mg (not administered)     Initial Impression / Assessment and Plan / ED Course  I have reviewed the triage vital signs and the nursing notes. 46 y.o. male with right knee pain s/p injury 3 days ago stable for d/c without fracture or dislocation noted on x-ray and no  focal neuro deficits. Will treat for pain, knee brace applied, crutches, ice and f/u with ortho if symptoms persist. Return precautions discussed.  Final Clinical Impressions(s) / ED Diagnoses   Final diagnoses:  Acute pain of right knee    New Prescriptions New Prescriptions   DICLOFENAC (VOLTAREN) 50 MG EC TABLET    Take 1 tablet (50 mg total) by mouth 2 (two) times daily.     Debroah Baller [place], [place] 12/12/16 2129    Daleen Bo, MD 12/12/16 2330

## 2016-12-12 NOTE — Discharge Instructions (Signed)
If the pain continues follow up with Dr. Alvan Dame. Return here as needed.

## 2017-01-11 ENCOUNTER — Encounter: Payer: Self-pay | Admitting: Hematology

## 2017-03-01 ENCOUNTER — Encounter: Payer: Self-pay | Admitting: Hematology

## 2017-03-13 ENCOUNTER — Telehealth: Payer: Self-pay | Admitting: *Deleted

## 2017-03-13 NOTE — Telephone Encounter (Signed)
TCT patient regarding scans he wants done @ the [place]. Call made to clarify MRI vs CT scans. Dr. Ernestina Penna orders were for CT scan of chest abdomen and pelvis. Per pt's email, fax # for [facility] for order for scans is [phone] (see emails)

## 2017-04-27 ENCOUNTER — Ambulatory Visit: Payer: BLUE CROSS/BLUE SHIELD | Admitting: Hematology

## 2017-04-27 ENCOUNTER — Other Ambulatory Visit: Payer: BLUE CROSS/BLUE SHIELD

## 2017-05-02 ENCOUNTER — Telehealth: Payer: Self-pay | Admitting: *Deleted

## 2017-05-02 ENCOUNTER — Ambulatory Visit: Payer: BLUE CROSS/BLUE SHIELD | Admitting: Hematology

## 2017-05-02 ENCOUNTER — Other Ambulatory Visit: Payer: BLUE CROSS/BLUE SHIELD

## 2017-05-02 NOTE — Telephone Encounter (Signed)
Left message for pt to return call regarding missed appt today.  

## 2017-05-03 ENCOUNTER — Telehealth: Payer: Self-pay | Admitting: Hematology

## 2017-05-03 NOTE — Telephone Encounter (Signed)
Appointments rescheduled per 2/27 sch msg. Letter/Calendar mailed to patient

## 2017-05-03 NOTE — Telephone Encounter (Signed)
Appointments rescheduled per 2/27 sch msg.  Letter/

## 2017-05-12 NOTE — Progress Notes (Signed)
[facility] [phone]   Fax:([phone]) [phone]  ONCOLOGY FOLLOW UP NOTE   Name: Leslie Mcmillan Date: 05/14/2017 MRN: [id number] DOB: 1970/03/10  PCP: [name], MD   REFERRING PHYSICIAN: Michael Boston, MD  PATIENT IDENTIFICATION: [name]  HISTORY OF PRESENT ILLNESS:Leslie Mcmillan is a 47 y.o. 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 Johney Maine.     He reports that in winter of 2012, he abdominal pains described as tense muscle spasms, sharp pains that were severe in quality.  He went to the emergency room at [facility] and he reports that he was told that he inflammation and provided antibiotics and ibuprofen. CT of abdomen on 10/25/2010 showed that there was mild inflammation surrounding the proximal jejunum near the ligament of Treitz, findings which could represent focal enteritis. He had later complained of some severe headaches causing an emergency room visit [facility] ED and he had a CT of head in January 2015 which showed no acute intracranial abnormality.   He notes that there was slow progression of his abdominal pain over time.  Eventually, due to it recurrence and progression, he presented to [facility] ER and CT of abdomen and pelvis was unrevealing on 05/05/2013.  Due to progressive abdominal pain and rectal bleeding, he was referred to [facility] (Dr. Hilarie Fredrickson).  He had a Meckels scan on 05/22/2013  and MRI (05/30/2013) and capsule endoscopy.  Meckel's scan was without scintigraphic evidence of ectopic gastric mucosa and demonstrated that there was a subtle round soft tissue mass within the right lower quadrant which was difficult to distinguish from the adjacent loops of small bowel. This small mass measured 2.1 x 2.4 cm on 10/25/2010 increased slowly to 2.8 x 2.7 cm on CT of 05/05/2013. Common differential for this small slow growing lesion would be a  carcinoid tumor or a gastrointestinal stromal tumor. No retractile pattern typical of carcinoid.    MRI of abdomen and pelvis on 05/30/2013 showed a diffusely enhacing 3.0 x 2.0 x 2.2 cm mass in the  new mesentery with close associated mesenteric vessels but the lesion did not appear to invade the small bowel.  As noted on the Mecke's can, top differential diagnostic consideration would be GIST or carcinoid tumor.  Lymphoma was not completely excluded. Of note, he also had several jejunal loops that were somewhat featureless which could be due to fold thickening or effacement.   Based on results of these studies, he was referred to to Surgery (Dr. Johney Maine).  He was admitted for mesenteric mass and present carcinoid and underwent laparoscopic assisted resection of ileum and lymph nodes on 08/12/2013.  There were no complications.  His pathology noted carcinoid with 3/6 lymph nodes positive.  Medical oncology consultation was obtained and patient seen by Dr Juliann Mule on 09/04/2013.     Current treatment: Observation  INTERVAL HISTORY: [name] returns for follow up. He presents tot he clinic today by himself. He was last seen by me 7 months ago. He reports he has been busy and has gone out of town. He reports occasional diarrhea/loose bowel movements and abdominal cramps still. He uses miralax as needed. He notes to have increased ho flashes.  He reports he had a MRI at the [place] with results of enlarged heart and fatty liver.   On review of systems, pt denies new complaints, or any other complaints at this time.  Pertinent positives are listed and detailed within the above HPI.   PAST MEDICAL HISTORY:  Past Medical History:  Diagnosis Date  . Anxiety   . Blood in stool 5--31-2015  . Depression   . Diverticulitis   . Gastroduodenitis   . GERD (gastroesophageal reflux disease)    no meds for  . Headache   . Hypertension    PAST SURGICAL HISTORY: Past Surgical History:  Procedure Laterality Date    . BOWEL RESECTION N/A 08/12/2013   Procedure: SMALL BOWEL ILEAL RESECTION;  Surgeon: Adin Hector, MD;  Location: [facility]S;  Service: General;  Laterality: N/A;  . COLONOSCOPY N/A 05/09/2013   Procedure: COLONOSCOPY;  Surgeon: Irene Shipper, MD;  Location: [facility] END[facility]COPY;  Service: Endoscopy;  Laterality: N/A;  . ESOPHAGOGASTRODUODENOSCOPY N/A 05/09/2013   Procedure: ESOPHAGOGASTRODUODENOSCOPY (EGD);  Surgeon: Irene Shipper, MD;  Location: [facility];  Service: Endoscopy;  Laterality: N/A;  possible egd depending on colon results  . LAPAROSCOPIC APPENDECTOMY N/A 08/12/2013   Procedure: LAPAROSCOPIC ASSISTED APPENDECTOMY;  Surgeon: Adin Hector, MD;  Location: [facility];  Service: General;  Laterality: N/A;  . TONSILLECTOMY  as child   CURRENT MEDICATIONS: has a current medication list which includes the following prescription(s): atorvastatin, losartan, omeprazole, sertraline, and trazodone.  ALLERGIES: Patient has no known allergies.  SOCIAL HISTORY:  reports that he has been smoking cigarettes.  He has a 5.00 pack-year smoking history. he has never used smokeless tobacco. He reports that he drinks alcohol. He reports that he uses drugs. Drug: Marijuana.  FAMILY HISTORY: family history includes Anuerysm in his mother; Diabetes in his paternal aunt, paternal grandmother, and paternal uncle; Hypertension in his paternal aunt and paternal grandmother; Liver disease in his paternal uncle.  REVIEW OF SYSTEMS:  Constitutional: Denies fevers, chills or abnormal weight loss (+) occasional hot flashes Eyes: Denies blurriness of vision Ears, nose, mouth, throat, and face: Denies mucositis or sore throat Respiratory: Denies cough, dyspnea or wheezes Cardiovascular: Denies palpitation, chest discomfort or lower extremity swelling Gastrointestinal:  Denies nausea, heartburn; diarrhea as noted in HPI. (+)diarrhea and constipation, abdominal bloating Skin: Denies abnormal skin rashes Lymphatics: Denies new  lymphadenopathy or easy bruising Neurological:Denies numbness, tingling or new weaknesses Behavioral/Psych: Mood is stable, no new changes  ABD: (+)soreness in right side flank All other systems were reviewed with the patient and are negative.  PHYSICAL EXAM:    height is '5\' 10"'  (1.778 m) and weight is 239 lb (108.4 kg). His oral temperature is 98.7 F (37.1 C). His blood pressure is 132/98 (abnormal) and his pulse is 82. His respiration is 18 and oxygen saturation is 97%.    GENERAL:alert, no distress and comfortable; anxious, well developed and well nourished.  SKIN: skin color, texture, turgor are normal, no rashes or significant lesions EYES: normal, Conjunctiva are pink and non-injected, sclera clear OROPHARYNX:no exudate, no erythema and lips, buccal mucosa, and tongue normal  NECK: supple, thyroid normal size, non-tender, without nodularity LYMPH:  no palpable lymphadenopathy in the cervical, axillary or inguinal LUNGS: clear to auscultation and percussion with normal breathing effort HEART: regular rate & rhythm and no murmurs and no lower extremity edema ABDOMEN:abdomen soft, mild tenderness at mid and left upper quadrant, normal bowel sounds; abdominal mid-line incision well healed; Diffuse abdominal tenderness, more on right.  Musculoskeletal:no cyanosis of digits and no clubbing NEURO: alert & oriented x 3 with fluent speech, no focal motor/sensory deficits  LABORATORY DATA:    CMP Latest Ref Rng & Units  05/14/2017 10/26/2016 04/28/2016  Glucose 70 - 140 mg/dL 106 100 131  BUN 7 - 26 mg/dL 14 9.2 12.7  Creatinine 0.70 - 1.30 mg/dL 1.51(H) 1.4(H) 1.3  Sodium 136 - 145 mmol/L 141 141 140  Potassium 3.5 - 5.1 mmol/L 4.3 4.3 3.9  Chloride 98 - 109 mmol/L 106 - -  CO2 22 - 29 mmol/L '26 28 24  ' Calcium 8.4 - 10.4 mg/dL 9.4 9.5 9.3  Total Protein 6.4 - 8.3 g/dL 6.7 7.1 7.0  Total Bilirubin 0.2 - 1.2 mg/dL 0.3 0.40 0.43  Alkaline Phos 40 - 150 U/L 79 78 80  AST 5 - 34 U/L '17 27 16   ' ALT 0 - 55 U/L 30 45 23   CBC CBC Latest Ref Rng & Units 05/14/2017 10/26/2016 04/28/2016  WBC 4.0 - 10.3 K/uL 8.0 8.8 8.5  Hemoglobin 13.0 - 17.1 g/dL 13.4 14.0 13.8  Hematocrit 38.4 - 49.9 % 40.6 42.2 41.2  Platelets 140 - 400 K/uL 251 260 262    PATHOLOGY:  Diagnosis 1. Appendix, Other than Incidental - BENIGN APPENDICEAL TISSUE WITH REACTIVE LYMPHOID HYPERPLASIA AND FIBROUS OBLITERANS. - NO EVIDENCE OF CARCINOID TUMOR. - NO EVIDENCE OF ACTIVE INFLAMMATION OR NEOPLASIA. 2. Small intestine, resection for tumor, ileal with ileal mass, mesenteric mass - WELL DIFFERENTIATED NEUROENDOCRINE TUMOR (CARCINOID TUMOR), INVADING THROUGH THE MUSCULARIS PROPRIA INTO SUBSEROSAL TISSUE. - ANGIOLYMPHATIC INVASION PRESENT. - THREE OF SIX LYMPH NODES, POSITIVE FOR CARCINOID TUMOR (3/6). - RESECTION MARGINS, NEGATIVE FOR ATYPIA OR MALIGNANCY. Microscopic Comment 2. 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 Histologic Grade : G1, Low grade Mitotic Rate < 2/10 high-power fields (HIGH POWER FIELD), Ki-67: <3% Microscopic Tumor Extension: Invading through the muscularis propria into subserosal tissue. Margins: Negative Proximal Margin: 8 cm Distal Margin: 19.5 cm Mesenteric (Radial) Margin : 10 cm from the mucosa lesion and 0.1 cm from the positive mesenteric lymph node, please see comment for details. Distance to the closest margin: please see comment for details.Lymph-Vascular Invasion: Present Perineural Invasion: Not identified Lymph nodes: number examined 6; number positive: 3. TNM Staging: pT3, pN1 1 of 3 FINAL for DONTRE, LADUCA ([id number]) Microscopic Comment(continued) Additional findings: N/A Comments: The mucosal mass in the ileum measures 1.5 cm. The tumor  cells invade through the muscularis propria into the subserosal tissue without penetration of the inked serosa.Immunohistochemical stains were performed and Ki-67 rate is less than 3%. 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 The mucosa tumor is 10 cm from the mesenteric resection margin. The tumor in the partially replaced lymph node is 0.1 cm from the inked mesenteric margin. The case was discussed with Dr. Neysa Bonito on 08/13/2013. (HCL:kh 08/13/13) H. CATHERINE LI MDPathologist, Electronic Signature(Case signed 08/14/2013)  RADIOGRAPHY:  Renal US 01/12/2016 IMPRESSION: Normal ultrasound appearance of the kidneys and bladder.  MR ABDOMEN W WO CONTRAST 11/06/2013  CLINICAL DATA: Carcinoid tumor of ileum. Surgery 6/15. Intermittent abdominal pain. EXAM: MRI ABDOMEN WITHOUT AND WITH CONTRAST  TECHNIQUE: Multiplanar multisequence MR imaging of the abdomen was performed both before and after the administration of intravenous contrast.  CONTRAST: 79m MULTIHANCE GADOBENATE DIMEGLUMINE 529 MG/ML IV SOLN 18 cc MultiHance  COMPARISON: 05/30/2013 and CT of 05/05/2013.  FINDINGS: Normal heart size without pericardial or pleural effusion. A tiny cyst in the right lobe  of the liver on image 28 of series 8. No suspicious liver lesions and no abnormal foci of arterial hyper enhancement. Normal spleen, stomach, pancreas, gallbladder, biliary tract, adrenal glands, kidneys. No abdominal adenopathy or ascites. Normal caliber of abdominal bowel loops. Small jejunal mesenteric nodes are likely reactive. No evidence omental/peritoneal disease. IMPRESSION: No acute process or evidence of metastatic disease within the abdomen. Electronically Signed  By: Abigail Miyamoto M.D. On: 11/06/2013 11:31  CT renal stone study 05/12/2015 IMPRESSION: 2 mm stone in the distal left ureter with moderate proximal obstruction. Additional tiny intrarenal  nonobstructing stones on the right.  ASSESSMENT AND PLAN:  Latravis Grine is a 47 y.o. male with a history of LOW GRADE CARCINOID s/p resection on 08/12/2013.    1.  Carcinoid of the Ileum s/p resection, Stage III (pT3,pN1) -We previously reviewed the natural history of small bowel carcinoid tumor, and the possibility of later recurrence.  -We again discussed surveillance plan. - last CT scan of abdomen (renal stone protocol) was negative for recurrence in 05/2015. -Lab reviewed, CBC is within normal limit, his Cr is high at 1.51. I encouraged him to drink plenty of water. Chromogranin A level is still pending -I'll continue surveillance. -I previously ordered a surveillance CT scan to be done last year, however it was not scheduled.  I have again ordered a repeat CT AP W Contrast to be done in 2 weeks. He states he had an MRI done at the [place]. I will get these results. I may be able to cancel his CT depending on the MRI results. We'll continue surveillance CT scan once a year for up to 5 years. If his scan is clear, I may consider just following him clinically.  -Today, he is clinically doing well, still has mild diarrhea/loose bowel movement and occasional abdominal cramps, no clinical concern for recurrence. -Continue surveillance  2.  Chronic diarrhea, abdominal cramps  -Likely secondary to surgery.  -I encouraged him to use Imodium as needed -Avoid high-fiber diet -Physical therapy  -I suggested he try Probiotics to help with bowel movement   3. PTSD, chronic Headaches, Insomnia, fatigue -follow up with his psychiatrist at [facility] -He is on trazodone and Zoloft  4. HTN -He is on losartan, but not compliant, he often forgets to take in the morning before he leaves the house for work. -I previously suggested him to take a medication in the evening -[facility]   5. Obesity -He is trying to lose some weight and eat healthy diet. He has spoke to a nutritionist. -He has not  been very active, I encouraged him to exercise.  Plan: -Repeat surveillance CT abdomen and pelvis scan in 2 weeks.  - Get results of MRI from the [place] -Lab and f/u in 6 months  -[facility] regarding HTN and medications    All questions were answered. The patient knows to call the clinic with any problems, questions or concerns. We can certainly see the patient much sooner if necessary.  I spent 20 minutes counseling the patient face to face. The total time spent in the appointment was 25 minutes.  This document serves as a record of services personally performed by Truitt Merle, MD. It was created on her behalf by Theresia Bough, a trained medical scribe. The creation of this record is based on the scribe's personal observations and the provider's statements to them.   I have reviewed the above documentation for accuracy and completeness, and I agree  with the above.   Truitt Merle  05/14/2017 4:23 PM

## 2017-05-14 ENCOUNTER — Inpatient Hospital Stay (HOSPITAL_BASED_OUTPATIENT_CLINIC_OR_DEPARTMENT_OTHER): Payer: Self-pay | Admitting: Hematology

## 2017-05-14 ENCOUNTER — Encounter: Payer: Self-pay | Admitting: Hematology

## 2017-05-14 ENCOUNTER — Telehealth: Payer: Self-pay | Admitting: Hematology

## 2017-05-14 ENCOUNTER — Inpatient Hospital Stay: Payer: Self-pay | Attending: Hematology

## 2017-05-14 VITALS — BP 132/98 | HR 82 | Temp 98.7°F | Resp 18 | Ht 70.0 in | Wt 239.0 lb

## 2017-05-14 DIAGNOSIS — R109 Unspecified abdominal pain: Secondary | ICD-10-CM | POA: Insufficient documentation

## 2017-05-14 DIAGNOSIS — R61 Generalized hyperhidrosis: Secondary | ICD-10-CM | POA: Insufficient documentation

## 2017-05-14 DIAGNOSIS — C7A012 Malignant carcinoid tumor of the ileum: Secondary | ICD-10-CM

## 2017-05-14 DIAGNOSIS — F4310 Post-traumatic stress disorder, unspecified: Secondary | ICD-10-CM | POA: Insufficient documentation

## 2017-05-14 DIAGNOSIS — R197 Diarrhea, unspecified: Secondary | ICD-10-CM

## 2017-05-14 DIAGNOSIS — E669 Obesity, unspecified: Secondary | ICD-10-CM | POA: Insufficient documentation

## 2017-05-14 DIAGNOSIS — D3A012 Benign carcinoid tumor of the ileum: Secondary | ICD-10-CM

## 2017-05-14 DIAGNOSIS — I10 Essential (primary) hypertension: Secondary | ICD-10-CM | POA: Insufficient documentation

## 2017-05-14 LAB — COMPREHENSIVE METABOLIC PANEL
ALT: 30 U/L (ref 0–55)
ANION GAP: 9 (ref 3–11)
AST: 17 U/L (ref 5–34)
Albumin: 3.5 g/dL (ref 3.5–5.0)
Alkaline Phosphatase: 79 U/L (ref 40–150)
BUN: 14 mg/dL (ref 7–26)
CHLORIDE: 106 mmol/L (ref 98–109)
CO2: 26 mmol/L (ref 22–29)
Calcium: 9.4 mg/dL (ref 8.4–10.4)
Creatinine, Ser: 1.51 mg/dL — ABNORMAL HIGH (ref 0.70–1.30)
GFR, EST NON AFRICAN AMERICAN: 54 mL/min — AB (ref >60)
Glucose, Bld: 106 mg/dL (ref 70–140)
POTASSIUM: 4.3 mmol/L (ref 3.5–5.1)
Sodium: 141 mmol/L (ref 136–145)
Total Bilirubin: 0.3 mg/dL (ref 0.2–1.2)
Total Protein: 6.7 g/dL (ref 6.4–8.3)

## 2017-05-14 LAB — CBC WITH DIFFERENTIAL/PLATELET
BASOS ABS: 0.1 10*3/uL (ref 0.0–0.1)
Basophils Relative: 1 %
EOS PCT: 3 %
Eosinophils Absolute: 0.3 10*3/uL (ref 0.0–0.5)
HCT: 40.6 % (ref 38.4–49.9)
Hemoglobin: 13.4 g/dL (ref 13.0–17.1)
LYMPHS PCT: 39 %
Lymphs Abs: 3.1 10*3/uL (ref 0.9–3.3)
MCH: 27.7 pg (ref 27.2–33.4)
MCHC: 33.1 g/dL (ref 32.0–36.0)
MCV: 83.7 fL (ref 79.3–98.0)
MONO ABS: 0.6 10*3/uL (ref 0.1–0.9)
Monocytes Relative: 8 %
Neutro Abs: 3.9 10*3/uL (ref 1.5–6.5)
Neutrophils Relative %: 49 %
PLATELETS: 251 10*3/uL (ref 140–400)
RBC: 4.85 MIL/uL (ref 4.20–5.82)
RDW: 14.3 % (ref 11.0–14.6)
WBC: 8 10*3/uL (ref 4.0–10.3)

## 2017-05-14 NOTE — Telephone Encounter (Signed)
Gave patient AVs and calendar of upcoming September appointments.  °

## 2017-05-16 LAB — CHROMOGRANIN A: CHROMOGRANIN A: 1 nmol/L (ref 0–5)

## 2017-05-28 ENCOUNTER — Telehealth: Payer: Self-pay | Admitting: *Deleted

## 2017-05-28 NOTE — Telephone Encounter (Signed)
TCT patient regarding recent lab work results.  Spoke to patient and informed him that his labs were normal including his tumor marker.  Pt voiced understanding.

## 2017-10-14 ENCOUNTER — Encounter: Payer: Self-pay | Admitting: Hematology

## 2017-11-12 ENCOUNTER — Ambulatory Visit: Payer: Self-pay | Admitting: Hematology

## 2017-11-12 ENCOUNTER — Other Ambulatory Visit: Payer: Self-pay

## 2017-11-15 ENCOUNTER — Emergency Department (HOSPITAL_COMMUNITY): Payer: Self-pay

## 2017-11-15 ENCOUNTER — Encounter (HOSPITAL_COMMUNITY): Payer: Self-pay | Admitting: Emergency Medicine

## 2017-11-15 ENCOUNTER — Emergency Department (HOSPITAL_COMMUNITY)
Admission: EM | Admit: 2017-11-15 | Discharge: 2017-11-16 | Disposition: A | Discharge status: Home / Self Care | Payer: Self-pay | Attending: Emergency Medicine | Admitting: Emergency Medicine

## 2017-11-15 DIAGNOSIS — F17210 Nicotine dependence, cigarettes, uncomplicated: Secondary | ICD-10-CM | POA: Insufficient documentation

## 2017-11-15 DIAGNOSIS — I10 Essential (primary) hypertension: Secondary | ICD-10-CM | POA: Insufficient documentation

## 2017-11-15 DIAGNOSIS — R112 Nausea with vomiting, unspecified: Secondary | ICD-10-CM | POA: Insufficient documentation

## 2017-11-15 DIAGNOSIS — Z79899 Other long term (current) drug therapy: Secondary | ICD-10-CM | POA: Insufficient documentation

## 2017-11-15 DIAGNOSIS — F1290 Cannabis use, unspecified, uncomplicated: Secondary | ICD-10-CM | POA: Insufficient documentation

## 2017-11-15 DIAGNOSIS — R1012 Left upper quadrant pain: Secondary | ICD-10-CM | POA: Insufficient documentation

## 2017-11-15 DIAGNOSIS — Z9889 Other specified postprocedural states: Secondary | ICD-10-CM | POA: Insufficient documentation

## 2017-11-15 LAB — COMPREHENSIVE METABOLIC PANEL
ALBUMIN: 3.9 g/dL (ref 3.5–5.0)
ALT: 38 U/L (ref 0–44)
AST: 22 U/L (ref 15–41)
Alkaline Phosphatase: 72 U/L (ref 38–126)
Anion gap: 9 (ref 5–15)
BUN: 11 mg/dL (ref 6–20)
CALCIUM: 9.8 mg/dL (ref 8.9–10.3)
CO2: 27 mmol/L (ref 22–32)
Chloride: 109 mmol/L (ref 98–111)
Creatinine, Ser: 1.41 mg/dL — ABNORMAL HIGH (ref 0.61–1.24)
GFR calc Af Amer: >60 mL/min (ref >60)
GFR calc non Af Amer: 58 mL/min — ABNORMAL LOW (ref >60)
GLUCOSE: 117 mg/dL — AB (ref 70–99)
Potassium: 3.7 mmol/L (ref 3.5–5.1)
Sodium: 145 mmol/L (ref 135–145)
TOTAL PROTEIN: 7 g/dL (ref 6.5–8.1)
Total Bilirubin: 0.7 mg/dL (ref 0.3–1.2)

## 2017-11-15 LAB — CBC
HCT: 41.4 % (ref 39.0–52.0)
HEMOGLOBIN: 13.9 g/dL (ref 13.0–17.0)
MCH: 28 pg (ref 26.0–34.0)
MCHC: 33.6 g/dL (ref 30.0–36.0)
MCV: 83.5 fL (ref 78.0–100.0)
Platelets: 278 10*3/uL (ref 150–400)
RBC: 4.96 MIL/uL (ref 4.22–5.81)
RDW: 13.4 % (ref 11.5–15.5)
WBC: 8 10*3/uL (ref 4.0–10.5)

## 2017-11-15 LAB — LIPASE, BLOOD: Lipase: 36 U/L (ref 11–51)

## 2017-11-15 LAB — URINALYSIS, ROUTINE W REFLEX MICROSCOPIC
Bilirubin Urine: NEGATIVE
Glucose, UA: NEGATIVE mg/dL
HGB URINE DIPSTICK: NEGATIVE
KETONES UR: NEGATIVE mg/dL
Leukocytes, UA: NEGATIVE
NITRITE: NEGATIVE
PH: 6 (ref 5.0–8.0)
Protein, ur: NEGATIVE mg/dL
SPECIFIC GRAVITY, URINE: 1.01 (ref 1.005–1.030)

## 2017-11-15 MED ORDER — FENTANYL CITRATE (PF) 100 MCG/2ML IJ SOLN
50.0000 ug | Freq: Once | INTRAMUSCULAR | Status: AC
  Administered 2017-11-16: 50 ug via INTRAVENOUS
  Filled 2017-11-15: qty 2

## 2017-11-15 MED ORDER — HYDROCODONE-ACETAMINOPHEN 5-325 MG PO TABS: 1.0000 | ORAL_TABLET | Freq: Four times a day (QID) | ORAL | 0 refills | Status: DC | PRN

## 2017-11-15 MED ORDER — IOPAMIDOL (ISOVUE-300) INJECTION 61%
100.0000 mL | Freq: Once | INTRAVENOUS | Status: AC | PRN
  Administered 2017-11-15: 100 mL via INTRAVENOUS

## 2017-11-15 MED ORDER — FENTANYL CITRATE (PF) 100 MCG/2ML IJ SOLN
50.0000 ug | Freq: Once | INTRAMUSCULAR | Status: AC
  Administered 2017-11-15: 50 ug via INTRAVENOUS
  Filled 2017-11-15: qty 2

## 2017-11-15 MED ORDER — IOPAMIDOL (ISOVUE-300) INJECTION 61%
INTRAVENOUS | Status: AC
  Filled 2017-11-15: qty 100

## 2017-11-15 MED ORDER — ONDANSETRON 4 MG PO TBDP: ORAL_TABLET | ORAL | 0 refills | Status: DC

## 2017-11-15 NOTE — ED Triage Notes (Signed)
Pt reports that he had colonoscopy yesterday and still having left side abd pains. Reports vomited once before came in today.

## 2017-11-15 NOTE — ED Provider Notes (Signed)
[place] DEPT Provider Note   CSN: [id number] Arrival date & time: 11/15/17  1605     History   Chief Complaint Chief Complaint  Patient presents with  . Abdominal Pain  . Emesis    HPI Leslie Mcmillan is a 47 y.o. male.  Patient had colonoscopy performed yesterday. He states he awoke during the procedure, had moderate discomfort at completion of procedure. Left sided abdominal pain has persisted and worsened since returning home. Patient appears very uncomfortable and is unable to find a position of comfort. He has had nausea throughout the day, with emesis x 2 of mostly colorless contents. Prior history of small bowel resection in 2015.   Abdominal Pain   This is a new problem. The current episode started yesterday. The problem occurs constantly. The problem has been gradually worsening. Associated with: recent colonoscopy. The pain is located in the LUQ and epigastric region. The pain is severe. Associated symptoms include flatus, nausea and vomiting. Pertinent negatives include fever. Past workup includes GI consult.    Past Medical History:  Diagnosis Date  . Anxiety   . Blood in stool 5--31-2015  . Depression   . Diverticulitis   . Gastroduodenitis   . GERD (gastroesophageal reflux disease)    no meds for  . Headache   . Hypertension     Patient Active Problem List   Diagnosis Date Noted  . Obesity (BMI 30-39.9) 03/16/2016  . Dog bite of right hand 09/09/2015  . GERD (gastroesophageal reflux disease) 09/09/2015  . Hyperlipidemia 03/12/2015  . Knee pain, bilateral 08/13/2014  . Physical exam 07/14/2014  . Bilateral carpal tunnel syndrome 07/13/2014  . Diarrhea 03/26/2014  . Dental abscess 12/22/2013  . Allergic rhinitis 11/20/2013  . HTN (hypertension) 11/20/2013  . Insomnia 11/13/2013  . Migraine headache without aura 11/13/2013  . Condyloma acuminatum of penis 08/12/2013  . 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 Duodenitis without bleeding 05/09/2013  . Diverticulosis of colon without hemorrhage 05/09/2013    Past Surgical History:  Procedure Laterality Date  . BOWEL RESECTION N/A 08/12/2013   Procedure: SMALL BOWEL ILEAL RESECTION;  Surgeon: Adin Hector, MD;  Location: [facility];  Service: General;  Laterality: N/A;  . COLONOSCOPY N/A 05/09/2013   Procedure: COLONOSCOPY;  Surgeon: Irene Shipper, MD;  Location: [facility];  Service: Endoscopy;  Laterality: N/A;  . ESOPHAGOGASTRODUODENOSCOPY N/A 05/09/2013   Procedure: ESOPHAGOGASTRODUODENOSCOPY (EGD);  Surgeon: Irene Shipper, MD;  Location: [facility];  Service: Endoscopy;  Laterality: N/A;  possible egd depending on colon results  . LAPAROSCOPIC APPENDECTOMY N/A 08/12/2013   Procedure: LAPAROSCOPIC ASSISTED APPENDECTOMY;  Surgeon: Adin Hector, MD;  Location: [facility];  Service: General;  Laterality: N/A;  . TONSILLECTOMY  as child        Home Medications    Prior to Admission medications   Medication Sig Start Date End Date Taking? Authorizing Provider  amitriptyline (ELAVIL) 100 MG tablet Take 100 mg by mouth at bedtime.   Yes [provider]  atorvastatin (LIPITOR) 20 MG tablet Take 1 tablet (20 mg total) by mouth at bedtime. 03/16/15  Yes [name], MD  losartan (COZAAR) 100 MG tablet Take 1 tablet (100 mg total) by mouth daily. 08/13/14  Yes [name], MD  omeprazole (PRILOSEC) 40 MG capsule Take 1 capsule (40 mg total) by mouth daily. 03/16/16  Yes [name], MD  polyethylene glycol [facility] / Floria Raveling)  packet Take 17 g by mouth daily as needed.   Yes [provider]  promethazine (PHENERGAN) 25 MG tablet Take 25 mg by mouth every 8 (eight) hours as needed for nausea or vomiting.   Yes [provider]  SUMAtriptan (IMITREX) 25 MG tablet Take 25 mg by mouth once. May repeat in 2 hours if headache persists or recurs.   Yes [provider]    traZODone (DESYREL) 50 MG tablet Take 0.5-1 tablets (25-50 mg total) by mouth at bedtime as needed for sleep. 07/14/14  Yes [name], MD  diclofenac (VOLTAREN) 50 MG EC tablet Take 1 tablet (50 mg total) by mouth 2 (two) times daily. Patient not taking: Reported on 05/14/2017 12/12/16   Ashley Murrain, NP  sertraline (ZOLOFT) 50 MG tablet Take 2 tablets (100 mg total) by mouth daily. Patient not taking: Reported on 05/14/2017 04/14/14   Pieter Partridge, DO    Family History Family History  Problem Relation Age of Onset  . Anuerysm Mother        brain  . Diabetes Paternal Grandmother   . Hypertension Paternal Grandmother   . Diabetes Paternal Uncle   . Diabetes Paternal Aunt   . Liver disease Paternal Uncle   . Hypertension Paternal Aunt     Social History Social History   Tobacco Use  . Smoking status: Current Every Day Smoker    Packs/day: 0.50    Years: 10.00    Pack years: 5.00    Types: Cigarettes  . Smokeless tobacco: Never Used  Substance Use Topics  . Alcohol use: Yes    Alcohol/week: 0.0 standard drinks    Comment: 2-3 beers some days  . Drug use: Yes    Types: Marijuana     Allergies   Patient has no known allergies.   Review of Systems Review of Systems  Constitutional: Negative for fever.  Gastrointestinal: Positive for abdominal pain, flatus, nausea and vomiting.  All other systems reviewed and are negative.    Physical Exam Updated Vital Signs BP (!) 144/99 (BP Location: Left Arm)   Pulse 84   Temp 98.6 F (37 C) (Oral)   Resp 18   Ht 5\' 9"  (1.753 m)   Wt 106.6 kg   SpO2 98%   BMI 34.70 kg/m   Physical Exam  Constitutional: He is oriented to person, place, and time. He appears well-developed and well-nourished. He appears distressed.  HENT:  Head: Normocephalic.  Eyes: Conjunctivae are normal.  Neck: Neck supple.  Cardiovascular: Normal rate and regular rhythm.  Pulmonary/Chest: Effort normal and breath sounds normal.   Abdominal: Bowel sounds are normal. There is tenderness.  Musculoskeletal: He exhibits no edema or deformity.  Neurological: He is alert and oriented to person, place, and time.  Skin: Skin is warm and dry.  Psychiatric: He has a normal mood and affect.  Nursing note and vitals reviewed.    ED Treatments / Results  Labs (all labs ordered are listed, but only abnormal results are displayed) Labs Reviewed  COMPREHENSIVE METABOLIC PANEL - Abnormal; Notable for the following components:      Result Value   Glucose, Bld 117 (*)    Creatinine, Ser 1.41 (*)    GFR calc non Af Amer 58 (*)    All other components within normal limits  LIPASE, BLOOD  CBC  URINALYSIS, ROUTINE W REFLEX MICROSCOPIC    EKG None  Radiology Ct Abdomen Pelvis W Contrast  Result Date: 11/15/2017 CLINICAL DATA:  Left-sided abdominal pain EXAM: CT ABDOMEN AND PELVIS WITH CONTRAST TECHNIQUE: Multidetector CT imaging of the abdomen and pelvis was performed using the standard protocol following bolus administration of intravenous contrast. CONTRAST:  164mL ISOVUE-300 IOPAMIDOL (ISOVUE-300) INJECTION 61% COMPARISON:  05/12/2015 FINDINGS: Lower chest: Dependent atelectasis. Hepatobiliary: Diffuse hepatic steatosis. Gallbladder is unremarkable. Pancreas: Unremarkable Spleen: Unremarkable Adrenals/Urinary Tract: Adrenal glands are within normal limits. Tiny calculus in the mid left kidney on image 36 of series 2. No hydronephrosis or ureteral calculus. There is a tiny calcification at the base of the bladder. Stomach/Bowel: Post appendectomy. Anastomosis staples in right lower quadrant small bowel. No obvious mass in the colon. No evidence of small-bowel obstruction. Stomach is decompressed. Vascular/Lymphatic: Atherosclerotic aortic and iliac artery calcifications. No abnormal retroperitoneal adenopathy. Portacaval nodal tissue is within normal limits on image 27. Nodal tissue superior to the body of the pancreas on image 20  is stable and within normal limits. Reproductive: Normal prostate. Other: No free fluid. Musculoskeletal: No vertebral compression deformity. IMPRESSION: Tiny calcification is present at the base of the bladder which may represent a recently passed calculus. Left nephrolithiasis. Postoperative changes. Electronically Signed   By: Marybelle Killings M.D.   On: 11/15/2017 22:37    Procedures Procedures (including critical care time)  Medications Ordered in ED Medications  iopamidol (ISOVUE-300) 61 % injection (has no administration in time range)  fentaNYL (SUBLIMAZE) injection 50 mcg (50 mcg Intravenous Given 11/15/17 2141)  iopamidol (ISOVUE-300) 61 % injection 100 mL (100 mLs Intravenous Contrast Given 11/15/17 2210)  fentaNYL (SUBLIMAZE) injection 50 mcg (50 mcg Intravenous Given 11/16/17 0009)     Initial Impression / Assessment and Plan / ED Course  I have reviewed the triage vital signs and the nursing notes.  Pertinent labs & imaging results that were available during my care of the patient were reviewed by me and considered in my medical decision making (see chart for details).     Patient is nontoxic, nonseptic appearing, in no apparent distress.  Patient's pain and other symptoms adequately managed in emergency department.  Labs, imaging and vitals reviewed.  Patient does not meet the SIRS or Sepsis criteria.  On repeat exam patient does not have a surgical abdomen and there are no peritoneal signs.  No indication of appendicitis, bowel obstruction, bowel perforation, cholecystitis, diverticulitis. Patient discharged home with symptomatic treatment and given strict instructions for follow-up with their primary care physician.  I have also discussed reasons to return immediately to the ER.  Patient expresses understanding and agrees with plan.    Final Clinical Impressions(s) / ED Diagnoses   Final diagnoses:  Left upper quadrant pain    ED Discharge Orders         Ordered     ondansetron (ZOFRAN ODT) 4 MG disintegrating tablet     11/15/17 2334    HYDROcodone-acetaminophen (NORCO) 5-325 MG tablet  Every 6 hours PRN     11/15/17 2334           Etta Quill, NP 11/16/17 0017    Daleen Bo, MD 11/16/17 1932

## 2017-11-15 NOTE — Discharge Instructions (Signed)
Your CT today shows a stone within the left kidney. It is possible you may have also passed a stone recently. This may account for some of your discomfort today. Please follow-up with urology (or the clinic at the [facility]) as discussed.

## 2017-11-24 NOTE — Progress Notes (Signed)
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 Johney Maine.     He reports that in winter of 2012, he abdominal pains described as tense muscle spasms, sharp pains that were severe in quality.  He went to the emergency room at [facility] and he reports that he was told that he inflammation and provided antibiotics and ibuprofen. CT of abdomen on 10/25/2010 showed that there was mild inflammation surrounding the proximal jejunum near the ligament of Treitz, findings which could represent focal enteritis. He had later complained of some severe headaches causing an emergency room visit [facility] and he had a CT of head in January 2015 which showed no acute intracranial abnormality.   He notes that there was slow progression of his abdominal pain over time.  Eventually, due to it recurrence and progression, he presented to [facility] ER and CT of abdomen and pelvis was unrevealing on 05/05/2013.  Due to progressive abdominal pain and rectal bleeding, he was referred to [facility] (Dr. Hilarie Fredrickson).  He had a Meckels scan on 05/22/2013  and MRI (05/30/2013) and capsule endoscopy.  Meckel's scan was without scintigraphic evidence of ectopic gastric mucosa and demonstrated that there was a subtle round soft tissue mass within the right lower quadrant which was difficult to distinguish from the adjacent loops of small bowel. This small mass measured 2.1 x 2.4 cm on 10/25/2010 increased slowly to 2.8 x 2.7 cm on CT of 05/05/2013. Common differential for this small slow growing lesion would be a  carcinoid tumor or a gastrointestinal stromal tumor. No retractile pattern typical of carcinoid.    MRI of abdomen and pelvis on 05/30/2013 showed a diffusely enhacing 3.0 x 2.0 x 2.2 cm mass in the  new mesentery with close associated mesenteric vessels but the lesion did not appear to invade the small bowel.  As noted on the Mecke's can, top differential diagnostic consideration would be GIST or carcinoid tumor.  Lymphoma was not completely excluded. Of note, he also had several jejunal loops that were somewhat featureless which could be due to fold thickening or effacement.   Based on results of these studies, he was referred to to Surgery (Dr. Johney Maine).  He was admitted for mesenteric mass and present carcinoid and underwent laparoscopic assisted resection of ileum and lymph nodes on 08/12/2013.  There were no complications.  His pathology noted carcinoid with 3/6 lymph nodes positive.  Medical oncology consultation was obtained and patient seen by Dr Juliann Mule on 09/04/2013.     Current treatment: Observation  INTERVAL HISTORY: [name] returns for follow up. I last saw him 6 months ago. Unfortunately, he went to the ER on 11/15/2017 complaining of left sided abdominal pain associated with nausea and vomiting one day after a colonoscopy.   Today, he is here alone at the clinic. He had recent kidney stones that passed, and he is recovering well.   No CP, dyspnea, cough. He complains of GERD, which he relates to drinking iced tea. He takes OTC medications for this.     PAST MEDICAL HISTORY:  Past Medical  History:  Diagnosis Date  . Anxiety   . Blood in stool 5--31-2015  . Depression   . Diverticulitis   . Gastroduodenitis   . GERD (gastroesophageal reflux disease)    no meds for  . Headache   . Hypertension    PAST SURGICAL HISTORY: Past Surgical History:  Procedure Laterality Date  . BOWEL RESECTION N/A 08/12/2013   Procedure: SMALL BOWEL ILEAL RESECTION;  Surgeon: Adin Hector,  MD;  Location: [facility] ORS;  Service: General;  Laterality: N/A;  . COLONOSCOPY N/A 05/09/2013   Procedure: COLONOSCOPY;  Surgeon: Irene Shipper, MD;  Location: [facility] ENDOSCOPY;  Service: Endoscopy;  Laterality: N/A;  . ESOPHAGOGASTRODUODENOSCOPY N/A 05/09/2013   Procedure: ESOPHAGOGASTRODUODENOSCOPY (EGD);  Surgeon: Irene Shipper, MD;  Location: [facility];  Service: Endoscopy;  Laterality: N/A;  possible egd depending on colon results  . LAPAROSCOPIC APPENDECTOMY N/A 08/12/2013   Procedure: LAPAROSCOPIC ASSISTED APPENDECTOMY;  Surgeon: Adin Hector, MD;  Location: [facility]S;  Service: General;  Laterality: N/A;  . TONSILLECTOMY  as child   CURRENT MEDICATIONS: has a current medication list which includes the following prescription(s): atorvastatin, losartan, omeprazole, sertraline, and trazodone.  ALLERGIES: Patient has no known allergies.  SOCIAL HISTORY:  reports that he has been smoking cigarettes. He has a 5.00 pack-year smoking history. He has never used smokeless tobacco. He reports that he drinks alcohol. He reports that he has current or past drug history. Drug: Marijuana.  FAMILY HISTORY: family history includes Anuerysm in his mother; Diabetes in his paternal aunt, paternal grandmother, and paternal uncle; Hypertension in his paternal aunt and paternal grandmother; Liver disease in his paternal uncle.  REVIEW OF SYSTEMS:  Constitutional: Denies fevers, chills or abnormal weight loss  Eyes: Denies blurriness of vision Ears, nose, mouth, throat, and face: Denies mucositis or sore throat Respiratory: Denies cough, dyspnea or wheezes Cardiovascular: Denies palpitation, chest discomfort or lower extremity swelling Gastrointestinal:  Denies nausea; diarrhea as noted in HPI. (+) GERD GU: (+) recent kidney stones, healing well Skin: Denies abnormal skin rashes Lymphatics: Denies new lymphadenopathy or easy bruising Neurological:Denies numbness, tingling or new weaknesses Behavioral/Psych: Mood is  stable, no new changes  All other systems were reviewed with the patient and are negative.  PHYSICAL EXAM:    height is _0  (1.753 m) and weight is 232 lb 6.4 oz (105.4 kg). His oral temperature is 98.5 F (36.9 C). His blood pressure is 147/92 (abnormal) and his pulse is 84. His respiration is 20 and oxygen saturation is 99%.    GENERAL:alert, no distress and comfortable; anxious, well developed and well nourished.  SKIN: skin color, texture, turgor are normal, no rashes or significant lesions EYES: normal, Conjunctiva are pink and non-injected, sclera clear OROPHARYNX:no exudate, no erythema and lips, buccal mucosa, and tongue normal  NECK: supple, thyroid normal size, non-tender, without nodularity LYMPH:  no palpable lymphadenopathy in the cervical, axillary or inguinal LUNGS: clear to auscultation and percussion with normal breathing effort HEART: regular rate & rhythm and no murmurs and no lower extremity edema ABDOMEN:abdomen soft, (+) mild tenderness at mid and left upper quadrant, normal bowel sounds; abdominal mid-line incision well healed. Musculoskeletal:no cyanosis of digits and no clubbing NEURO: alert & oriented x 3 with fluent speech, no focal motor/sensory deficits  LABORATORY DATA:    CMP Latest Ref Rng & Units 11/28/2017 11/15/2017 05/14/2017  Glucose 70 - 99 mg/dL 129(H) 117(H) 106  BUN 6 - 20 mg/dL _1 Creatinine 0.61 - 1.24  mg/dL 1.61(H) 1.41(H) 1.51(H)  Sodium 135 - 145 mmol/L 142 145 141  Potassium 3.5 - 5.1 mmol/L 4.1 3.7 4.3  Chloride 98 - 111 mmol/L 104 109 106  CO2 22 - 32 mmol/L _0 Calcium 8.9 - 10.3 mg/dL 9.5 9.8 9.4  Total Protein 6.5 - 8.1 g/dL 6.9 7.0 6.7  Total Bilirubin 0.3 - 1.2 mg/dL 0.3 0.7 0.3  Alkaline Phos 38 - 126 U/L 78 72 79  AST 15 - 41 U/L _1 ALT 0 - 44 U/L 27 38 30   CBC CBC Latest Ref Rng & Units 11/28/2017 11/15/2017 05/14/2017  WBC 4.0 - 10.3 K/uL 6.8 8.0 8.0  Hemoglobin 13.0 - 17.1 g/dL 13.7 13.9 13.4    Hematocrit 38.4 - 49.9 % 41.1 41.4 40.6  Platelets 140 - 400 K/uL 268 278 251    PATHOLOGY:  Diagnosis 1. Appendix, Other than Incidental - BENIGN APPENDICEAL TISSUE WITH REACTIVE LYMPHOID HYPERPLASIA AND FIBROUS OBLITERANS. - NO EVIDENCE OF CARCINOID TUMOR. - NO EVIDENCE OF ACTIVE INFLAMMATION OR NEOPLASIA. 2. Small intestine, resection for tumor, ileal with ileal mass, mesenteric mass - WELL DIFFERENTIATED NEUROENDOCRINE TUMOR (CARCINOID TUMOR), INVADING THROUGH THE MUSCULARIS PROPRIA INTO SUBSEROSAL TISSUE. - ANGIOLYMPHATIC INVASION PRESENT. - THREE OF SIX LYMPH NODES, POSITIVE FOR CARCINOID TUMOR (3/6). - RESECTION MARGINS, NEGATIVE FOR ATYPIA OR MALIGNANCY. Microscopic Comment 2. 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 Histologic Grade : G1, Low grade Mitotic Rate < 2/10 high-power fields (HIGH POWER FIELD), Ki-67: <3% Microscopic Tumor Extension: Invading through the muscularis propria into subserosal tissue. Margins: Negative Proximal Margin: 8 cm Distal Margin: 19.5 cm Mesenteric (Radial) Margin : 10 cm from the mucosa lesion and 0.1 cm from the positive mesenteric lymph node, please see comment for details. Distance to the closest margin: please see comment for details.Lymph-Vascular Invasion: Present Perineural Invasion: Not identified Lymph nodes: number examined 6; number positive: 3. TNM Staging: pT3, pN1 1 of 3 FINAL for NAKIA, KOBLE ([id number]) Microscopic Comment(continued) Additional findings: N/A Comments: The mucosal mass in the ileum measures 1.5 cm. The tumor cells invade through the muscularis propria into the subserosal tissue without penetration of the inked serosa.Immunohistochemical stains were performed and Ki-67 rate is  less than 3%. 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 The mucosa tumor is 10 cm from the mesenteric resection margin. The tumor in the partially replaced lymph node is 0.1 cm from the inked mesenteric margin. The case was discussed with Dr. Neysa Bonito on 08/13/2013. (HCL:kh 08/13/13) Gretel Acre LI MDPathologist, Electronic Signature(Case signed 08/14/2013)  RADIOGRAPHY:  11/15/2017 CT Abdomen IMPRESSION: Tiny calcification is present at the base of the bladder which may represent a recently passed calculus.  Left nephrolithiasis.  Postoperative changes.  Renal US 01/12/2016 IMPRESSION: Normal ultrasound appearance of the kidneys and bladder.  MR ABDOMEN W WO CONTRAST 11/06/2013  CLINICAL DATA: Carcinoid tumor of ileum. Surgery 6/15. Intermittent abdominal pain. EXAM: MRI ABDOMEN WITHOUT AND WITH CONTRAST  TECHNIQUE: Multiplanar multisequence MR imaging of the abdomen was performed both before and after the administration of intravenous contrast.  CONTRAST: 10m MULTIHANCE GADOBENATE DIMEGLUMINE 529 MG/ML IV SOLN 18 cc MultiHance  COMPARISON: 05/30/2013 and CT of 05/05/2013.  FINDINGS: Normal heart size without pericardial or pleural effusion. A tiny cyst in  the right lobe of the liver on image 28 of series 8. No suspicious liver lesions and no abnormal foci of arterial hyper enhancement. Normal spleen, stomach, pancreas, gallbladder, biliary tract, adrenal glands, kidneys. No abdominal adenopathy or ascites. Normal caliber of abdominal bowel loops. Small jejunal mesenteric nodes are likely reactive. No evidence omental/peritoneal disease. IMPRESSION: No acute process or evidence of metastatic disease within the abdomen. Electronically Signed  By: Abigail Miyamoto M.D. On: 11/06/2013 11:31  CT renal stone study 05/12/2015 IMPRESSION: 2 mm stone in the distal left ureter with moderate proximal obstruction.  Additional tiny intrarenal nonobstructing stones on the right.  ASSESSMENT AND PLAN:   Dior Stepter is a 47 y.o. male with a history of LOW GRADE CARCINOID s/p resection on 08/12/2013.    1.  Carcinoid of the Ileum s/p resection, Stage III (pT3,pN1) -We previously reviewed the natural history of small bowel carcinoid tumor, and the possibility of later recurrence.  -We again discussed surveillance plan. - last CT scan of abdomen (renal stone protocol) was negative for recurrence in 11/2017. I reviewed with patient today. --Lab reviewed, CBC is within normal limit, his Cr is high at 1.61. I encouraged him previously to drink plenty of water. Chromogranin A level is still pending -Today, he is clinically doing well. His only complaint is GERD that is controlled with OTC medications.  Exam was unremarkable, there is no clinical concern for recurrence. -He is over 4 years from initial diagnosis, plan to repeat one more routine surveillance CT scan next year, will continue continue surveillance with annual lab and exam only after 5 years.  2.  Chronic diarrhea, abdominal cramps, GERD -Likely secondary to surgery.  -I encouraged him to use Imodium as needed -Avoid high-fiber diet -I suggested he tries Probiotics to help with bowel movement -Overall mild and stable -he is taking omeprazole for his acid reflux, controlled.   3. PTSD, chronic Headaches, Insomnia, fatigue -follow up with his psychiatrist at [facility] -He is on trazodone and Zoloft  4. HTN -He is on losartan, but not compliant, he often forgets to take in the morning before he leaves the house for work. -I previously suggested him to take a medication in the evening -[facility]   5. Obesity -He is trying to lose some weight and eat healthy diet. He has spoke to a nutritionist. -He has not been very active, I encouraged him to exercise.  Plan: -lab and recent CT scan results reviewed -F/u in 8 months with lab and CT CAP  with contrast a week before    All questions were answered. The patient knows to call the clinic with any problems, questions or concerns. We can certainly see the patient much sooner if necessary.  I spent 20 minutes counseling the patient face to face. The total time spent in the appointment was 25 minutes.  Dierdre Searles Dweik am acting as scribe for Dr. Truitt Merle.  I have reviewed the above documentation for accuracy and completeness, and I agree with the above.    Truitt Merle  11/28/2017

## 2017-11-28 ENCOUNTER — Inpatient Hospital Stay: Payer: Self-pay | Attending: Hematology

## 2017-11-28 ENCOUNTER — Inpatient Hospital Stay (HOSPITAL_BASED_OUTPATIENT_CLINIC_OR_DEPARTMENT_OTHER): Payer: Self-pay | Admitting: Hematology

## 2017-11-28 ENCOUNTER — Encounter: Payer: Self-pay | Admitting: Hematology

## 2017-11-28 ENCOUNTER — Telehealth: Payer: Self-pay | Admitting: Hematology

## 2017-11-28 VITALS — BP 147/92 | HR 84 | Temp 98.5°F | Resp 20 | Ht 69.0 in | Wt 232.4 lb

## 2017-11-28 DIAGNOSIS — Z85060 Personal history of malignant carcinoid tumor of small intestine: Secondary | ICD-10-CM | POA: Insufficient documentation

## 2017-11-28 DIAGNOSIS — K219 Gastro-esophageal reflux disease without esophagitis: Secondary | ICD-10-CM

## 2017-11-28 DIAGNOSIS — D3A012 Benign carcinoid tumor of the ileum: Secondary | ICD-10-CM

## 2017-11-28 DIAGNOSIS — I10 Essential (primary) hypertension: Secondary | ICD-10-CM | POA: Insufficient documentation

## 2017-11-28 DIAGNOSIS — F4310 Post-traumatic stress disorder, unspecified: Secondary | ICD-10-CM | POA: Insufficient documentation

## 2017-11-28 DIAGNOSIS — Z79899 Other long term (current) drug therapy: Secondary | ICD-10-CM | POA: Insufficient documentation

## 2017-11-28 DIAGNOSIS — E669 Obesity, unspecified: Secondary | ICD-10-CM

## 2017-11-28 LAB — CMP (CANCER CENTER ONLY)
ALBUMIN: 3.8 g/dL (ref 3.5–5.0)
ALT: 27 U/L (ref 0–44)
ANION GAP: 10 (ref 5–15)
AST: 18 U/L (ref 15–41)
Alkaline Phosphatase: 78 U/L (ref 38–126)
BILIRUBIN TOTAL: 0.3 mg/dL (ref 0.3–1.2)
BUN: 10 mg/dL (ref 6–20)
CHLORIDE: 104 mmol/L (ref 98–111)
CO2: 28 mmol/L (ref 22–32)
Calcium: 9.5 mg/dL (ref 8.9–10.3)
Creatinine: 1.61 mg/dL — ABNORMAL HIGH (ref 0.61–1.24)
GFR, EST NON AFRICAN AMERICAN: 50 mL/min — AB (ref >60)
GFR, Est AFR Am: 58 mL/min — ABNORMAL LOW (ref >60)
Glucose, Bld: 129 mg/dL — ABNORMAL HIGH (ref 70–99)
POTASSIUM: 4.1 mmol/L (ref 3.5–5.1)
SODIUM: 142 mmol/L (ref 135–145)
TOTAL PROTEIN: 6.9 g/dL (ref 6.5–8.1)

## 2017-11-28 LAB — CBC WITH DIFFERENTIAL/PLATELET
BASOS ABS: 0.1 10*3/uL (ref 0.0–0.1)
Basophils Relative: 1 %
EOS ABS: 0.2 10*3/uL (ref 0.0–0.5)
EOS PCT: 3 %
HCT: 41.1 % (ref 38.4–49.9)
HEMOGLOBIN: 13.7 g/dL (ref 13.0–17.1)
LYMPHS ABS: 2.4 10*3/uL (ref 0.9–3.3)
Lymphocytes Relative: 35 %
MCH: 28.1 pg (ref 27.2–33.4)
MCHC: 33.5 g/dL (ref 32.0–36.0)
MCV: 83.9 fL (ref 79.3–98.0)
Monocytes Absolute: 0.6 10*3/uL (ref 0.1–0.9)
Monocytes Relative: 9 %
NEUTROS PCT: 52 %
Neutro Abs: 3.6 10*3/uL (ref 1.5–6.5)
PLATELETS: 268 10*3/uL (ref 140–400)
RBC: 4.9 MIL/uL (ref 4.20–5.82)
RDW: 14 % (ref 11.0–14.6)
WBC: 6.8 10*3/uL (ref 4.0–10.3)

## 2017-11-28 NOTE — Telephone Encounter (Signed)
Gave pt avs and calendar  °

## 2017-11-30 LAB — CHROMOGRANIN A: CHROMOGRANIN A: 2 nmol/L (ref 0–5)

## 2018-04-25 ENCOUNTER — Other Ambulatory Visit: Payer: Self-pay | Admitting: Hematology

## 2018-04-25 DIAGNOSIS — D3A012 Benign carcinoid tumor of the ileum: Secondary | ICD-10-CM

## 2018-07-24 ENCOUNTER — Inpatient Hospital Stay: Payer: Self-pay | Attending: Family Medicine

## 2018-07-26 ENCOUNTER — Inpatient Hospital Stay: Payer: Self-pay | Admitting: Hematology

## 2018-10-14 ENCOUNTER — Encounter (HOSPITAL_COMMUNITY): Payer: Self-pay | Admitting: Family Medicine

## 2018-10-14 ENCOUNTER — Other Ambulatory Visit: Payer: Self-pay

## 2018-10-14 ENCOUNTER — Emergency Department (HOSPITAL_COMMUNITY): Payer: No Typology Code available for payment source

## 2018-10-14 ENCOUNTER — Emergency Department (HOSPITAL_COMMUNITY)
Admission: EM | Admit: 2018-10-14 | Discharge: 2018-10-15 | Disposition: A | Discharge status: Home / Self Care | Payer: No Typology Code available for payment source | Attending: Emergency Medicine | Admitting: Emergency Medicine

## 2018-10-14 DIAGNOSIS — Z79899 Other long term (current) drug therapy: Secondary | ICD-10-CM | POA: Diagnosis not present

## 2018-10-14 DIAGNOSIS — R1030 Lower abdominal pain, unspecified: Secondary | ICD-10-CM | POA: Diagnosis present

## 2018-10-14 DIAGNOSIS — I10 Essential (primary) hypertension: Secondary | ICD-10-CM | POA: Diagnosis not present

## 2018-10-14 DIAGNOSIS — N201 Calculus of ureter: Secondary | ICD-10-CM | POA: Diagnosis not present

## 2018-10-14 DIAGNOSIS — F17210 Nicotine dependence, cigarettes, uncomplicated: Secondary | ICD-10-CM | POA: Insufficient documentation

## 2018-10-14 HISTORY — DX: Calculus of kidney: N20.0

## 2018-10-14 LAB — BASIC METABOLIC PANEL
Anion gap: 10 (ref 5–15)
BUN: 15 mg/dL (ref 6–20)
CO2: 23 mmol/L (ref 22–32)
Calcium: 9.2 mg/dL (ref 8.9–10.3)
Chloride: 108 mmol/L (ref 98–111)
Creatinine, Ser: 1.71 mg/dL — ABNORMAL HIGH (ref 0.61–1.24)
GFR calc Af Amer: 54 mL/min — ABNORMAL LOW (ref >60)
GFR calc non Af Amer: 47 mL/min — ABNORMAL LOW (ref >60)
Glucose, Bld: 114 mg/dL — ABNORMAL HIGH (ref 70–99)
Potassium: 4 mmol/L (ref 3.5–5.1)
Sodium: 141 mmol/L (ref 135–145)

## 2018-10-14 LAB — CBC
HCT: 41.9 % (ref 39.0–52.0)
Hemoglobin: 13.6 g/dL (ref 13.0–17.0)
MCH: 28.5 pg (ref 26.0–34.0)
MCHC: 32.5 g/dL (ref 30.0–36.0)
MCV: 87.7 fL (ref 80.0–100.0)
Platelets: 264 10*3/uL (ref 150–400)
RBC: 4.78 MIL/uL (ref 4.22–5.81)
RDW: 13.1 % (ref 11.5–15.5)
WBC: 9.9 10*3/uL (ref 4.0–10.5)
nRBC: 0 % (ref 0.0–0.2)

## 2018-10-14 MED ORDER — KETOROLAC TROMETHAMINE 30 MG/ML IJ SOLN
30.0000 mg | Freq: Once | INTRAMUSCULAR | Status: AC
  Administered 2018-10-14: 22:00:00 30 mg via INTRAVENOUS
  Filled 2018-10-14: qty 1

## 2018-10-14 MED ORDER — HYDROMORPHONE HCL 1 MG/ML IJ SOLN
0.5000 mg | Freq: Once | INTRAMUSCULAR | Status: AC
  Administered 2018-10-14: 0.5 mg via INTRAVENOUS
  Filled 2018-10-14: qty 1

## 2018-10-14 MED ORDER — ONDANSETRON HCL 4 MG/2ML IJ SOLN
4.0000 mg | Freq: Once | INTRAMUSCULAR | Status: AC
  Administered 2018-10-14: 4 mg via INTRAVENOUS
  Filled 2018-10-14: qty 2

## 2018-10-14 NOTE — ED Triage Notes (Signed)
Patient is experenicing left groin pain that started suddenly about 2 hours ago. Patient relates this pain to kidney stones and appears uncomfortable. Patient denies any urinary symptoms or fever.

## 2018-10-14 NOTE — ED Provider Notes (Signed)
[facility] DEPT Provider Note   CSN: [id number] Arrival date & time: 10/14/18  2035    History   Chief Complaint Chief Complaint  Patient presents with  . Groin Pain    HPI Leslie Mcmillan is a 48 y.o. male.     HPI Patient presents with left lower abdomen and left groin pain.  History of kidney stones.  States his pain began acutely a couple hours ago.  Feels like previous stones.  No dysuria.  Some nausea without vomiting.  No fevers.  Last kidney stone was just under a year ago. Past Medical History:  Diagnosis Date  . Anxiety   . Blood in stool 5--31-2015  . Depression   . Diverticulitis   . Gastroduodenitis   . GERD (gastroesophageal reflux disease)    no meds for  . Headache   . Hypertension   . Kidney stones     Patient Active Problem List   Diagnosis Date Noted  . Obesity (BMI 30-39.9) 03/16/2016  . Dog bite of right hand 09/09/2015  . GERD (gastroesophageal reflux disease) 09/09/2015  . Hyperlipidemia 03/12/2015  . Knee pain, bilateral 08/13/2014  . Physical exam 07/14/2014  . Bilateral carpal tunnel syndrome 07/13/2014  . Diarrhea 03/26/2014  . Dental abscess 12/22/2013  . Allergic rhinitis 11/20/2013  . HTN (hypertension) 11/20/2013  . Insomnia 11/13/2013  . Migraine headache without aura 11/13/2013  . Condyloma acuminatum of penis 08/12/2013  . 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 Duodenitis without bleeding 05/09/2013  . Diverticulosis of colon without hemorrhage 05/09/2013    Past Surgical History:  Procedure Laterality Date  . BOWEL RESECTION N/A 08/12/2013   Procedure: SMALL BOWEL ILEAL RESECTION;  Surgeon: Adin Hector, MD;  Location: [facility]S;  Service: General;  Laterality: N/A;  . COLONOSCOPY N/A 05/09/2013   Procedure: COLONOSCOPY;  Surgeon: Irene Shipper, MD;  Location: [facility]OSCOPY;  Service: Endoscopy;  Laterality: N/A;  . ESOPHAGOGASTRODUODENOSCOPY N/A  05/09/2013   Procedure: ESOPHAGOGASTRODUODENOSCOPY (EGD);  Surgeon: Irene Shipper, MD;  Location: [facility];  Service: Endoscopy;  Laterality: N/A;  possible egd depending on colon results  . LAPAROSCOPIC APPENDECTOMY N/A 08/12/2013   Procedure: LAPAROSCOPIC ASSISTED APPENDECTOMY;  Surgeon: Adin Hector, MD;  Location: [facility];  Service: General;  Laterality: N/A;  . TONSILLECTOMY  as child        Home Medications    Prior to Admission medications   Medication Sig Start Date End Date Taking? Authorizing Provider  amitriptyline (ELAVIL) 100 MG tablet Take 100 mg by mouth at bedtime.    [provider]  atorvastatin (LIPITOR) 20 MG tablet Take 1 tablet (20 mg total) by mouth at bedtime. 03/16/15   [name], MD  diclofenac (VOLTAREN) 50 MG EC tablet Take 1 tablet (50 mg total) by mouth 2 (two) times daily. 12/12/16   Ashley Murrain, NP  HYDROcodone-acetaminophen (NORCO) 5-325 MG tablet Take 1 tablet by mouth every 6 (six) hours as needed for severe pain. 11/15/17   Etta Quill, NP  losartan (COZAAR) 100 MG tablet Take 1 tablet (100 mg total) by mouth daily. 08/13/14   [name], MD  omeprazole (PRILOSEC) 40 MG capsule Take 1 capsule (40 mg total) by mouth daily. 03/16/16   [name], MD  ondansetron (ZOFRAN ODT) 4 MG disintegrating tablet 4mg  ODT q4 hours prn nausea/vomit 11/15/17   Etta Quill, NP  polyethylene glycol [facility] / Floria Raveling) packet  Take 17 g by mouth daily as needed.    [provider]  promethazine (PHENERGAN) 25 MG tablet Take 25 mg by mouth every 8 (eight) hours as needed for nausea or vomiting.    [provider]  sertraline (ZOLOFT) 50 MG tablet Take 2 tablets (100 mg total) by mouth daily. 04/14/14   Pieter Partridge, DO  SUMAtriptan (IMITREX) 25 MG tablet Take 25 mg by mouth once. May repeat in 2 hours if headache persists or recurs.    [provider]  traZODone (DESYREL) 50 MG tablet Take 0.5-1 tablets (25-50 mg  total) by mouth at bedtime as needed for sleep. 07/14/14   [name], MD    Family History Family History  Problem Relation Age of Onset  . Anuerysm Mother        brain  . Diabetes Paternal Grandmother   . Hypertension Paternal Grandmother   . Diabetes Paternal Uncle   . Diabetes Paternal Aunt   . Liver disease Paternal Uncle   . Hypertension Paternal Aunt     Social History Social History   Tobacco Use  . Smoking status: Current Every Day Smoker    Packs/day: 0.50    Years: 10.00    Pack years: 5.00    Types: Cigarettes  . Smokeless tobacco: Never Used  Substance Use Topics  . Alcohol use: Yes    Alcohol/week: 0.0 standard drinks    Comment: 2-3 beers some days  . Drug use: Not Currently    Types: Marijuana     Allergies   Patient has no known allergies.   Review of Systems Review of Systems  Constitutional: Negative for appetite change.  HENT: Negative for congestion.   Cardiovascular: Negative for chest pain.  Gastrointestinal: Positive for abdominal pain.  Genitourinary: Positive for flank pain. Negative for penile swelling and scrotal swelling.  Musculoskeletal: Negative for back pain.  Skin: Negative for rash.     Physical Exam Updated Vital Signs BP (!) 146/85   Pulse 68   Temp 98.6 F (37 C) (Oral) Comment: Simultaneous filing. User may not have seen previous data. Comment (Src): Simultaneous filing. User may not have seen previous data.  Resp 18   Ht 5\' 9"  (1.753 m)   Wt 99.8 kg   SpO2 91%   BMI 32.49 kg/m   Physical Exam Vitals signs and nursing note reviewed.  HENT:     Head: Normocephalic.  Cardiovascular:     Rate and Rhythm: Normal rate and regular rhythm.  Abdominal:     Tenderness: There is abdominal tenderness.  Genitourinary:    Comments: CVA tenderness on left.  No testicular tenderness.  No hernia palpated.  Some left lower quadrant tenderness also. Skin:    Capillary Refill: Capillary refill takes less than 2  seconds.  Neurological:     Mental Status: He is alert. Mental status is at baseline.      ED Treatments / Results  Labs (all labs ordered are listed, but only abnormal results are displayed) Labs Reviewed  BASIC METABOLIC PANEL - Abnormal; Notable for the following components:      Result Value   Glucose, Bld 114 (*)    Creatinine, Ser 1.71 (*)    GFR calc non Af Amer 47 (*)    GFR calc Af Amer 54 (*)    All other components within normal limits  CBC  URINALYSIS, ROUTINE W REFLEX MICROSCOPIC    EKG None  Radiology Dg Abdomen 1 View  Result Date: 10/14/2018 CLINICAL DATA:  Left flank pain EXAM: ABDOMEN - 1 VIEW COMPARISON:  CT December 16, 2017 FINDINGS: The bowel gas pattern is normal. No radio-opaque calculi or other significant radiographic abnormality are seen. IMPRESSION: Nonobstructive bowel gas pattern. No calculi overlying the renal shadows. Electronically Signed   By: Prudencio Pair M.D.   On: 10/14/2018 22:37   US Renal  Result Date: 10/14/2018 CLINICAL DATA:  Left flank pain EXAM: RENAL / URINARY TRACT ULTRASOUND COMPLETE COMPARISON:  CT November 16, 2017, ultrasound January 12, 2016 FINDINGS: Right Kidney: Renal measurements: 11.2 x 5.5 x 5.8 cm = volume: 187 mL mL . Echogenicity within normal limits. No mass or hydronephrosis visualized. Left Kidney: Renal measurements: 12.0 x 5.9 by 6.11 cm = volume: 227 mL mL. Echogenicity is normal. There is mild pelviectasis. No definite renal calculi are seen. Bladder: Appears normal for degree of bladder distention. Bilateral ureteral jets seen. IMPRESSION: Mild left pelviectasis without definite renal calculi. Electronically Signed   By: Prudencio Pair M.D.   On: 10/14/2018 23:00    Procedures Procedures (including critical care time)  Medications Ordered in ED Medications  ketorolac (TORADOL) 30 MG/ML injection 30 mg (30 mg Intravenous Given 10/14/18 2222)  HYDROmorphone (DILAUDID) injection 0.5 mg (0.5 mg Intravenous Given  10/14/18 2222)  ondansetron (ZOFRAN) injection 4 mg (4 mg Intravenous Given 10/14/18 2222)     Initial Impression / Assessment and Plan / ED Course  I have reviewed the triage vital signs and the nursing notes.  Pertinent labs & imaging results that were available during my care of the patient were reviewed by me and considered in my medical decision making (see chart for details).        Patient with left flank pain.  History of kidney stones.  States this feels the same.  CT scan from 11 months ago showed a renal stone.  Feeling better after treatment now.  Creatinine mildly elevated but appears to be near baseline.  White count is not elevated.  Urinalysis still pending.  Care will be turned over to oncoming provider.  Likely discharge home with pain medicine.  X-ray reassuring and ultrasound showed some mild pelviectasis.  Potentially could have passed a stone.  Final Clinical Impressions(s) / ED Diagnoses   Final diagnoses:  Left ureteral stone    ED Discharge Orders    None       Davonna Belling, MD 10/14/18 2318

## 2018-10-15 LAB — URINALYSIS, ROUTINE W REFLEX MICROSCOPIC
Bilirubin Urine: NEGATIVE
Glucose, UA: NEGATIVE mg/dL
Ketones, ur: NEGATIVE mg/dL
Leukocytes,Ua: NEGATIVE
Nitrite: NEGATIVE
Protein, ur: NEGATIVE mg/dL
RBC / HPF: >50 RBC/hpf — ABNORMAL HIGH (ref 0–5)
Specific Gravity, Urine: 1.018 (ref 1.005–1.030)
pH: 5 (ref 5.0–8.0)

## 2018-10-15 MED ORDER — HYDROCODONE-ACETAMINOPHEN 5-325 MG PO TABS: 1.0000 | ORAL_TABLET | Freq: Three times a day (TID) | ORAL | 0 refills | Status: AC | PRN

## 2018-10-15 MED ORDER — IBUPROFEN 600 MG PO TABS: 600.0000 mg | ORAL_TABLET | Freq: Four times a day (QID) | ORAL | 0 refills | Status: DC | PRN

## 2018-10-15 NOTE — ED Provider Notes (Signed)
I assumed care of this patient from Dr. Alvino Chapel at 2330.  Please see their note for further details of Hx, PE.  Briefly patient is a 48 y.o. male who presented with left renal colic. Pain controlled, pending UA.   UA with hematuria; no infection.  The patient is safe for discharge with strict return precautions.   The patient appears reasonably screened and/or stabilized for discharge and I doubt any other medical condition or other [facility] requiring further screening, evaluation, or treatment in the ED at this time prior to discharge.  Disposition: Discharge  Condition: Good  I have discussed the results, Dx and Tx plan with the patient who expressed understanding and agree(s) with the plan. Discharge instructions discussed at great length. The patient was given strict return precautions who verbalized understanding of the instructions. No further questions at time of discharge.    ED Discharge Orders         Ordered    ibuprofen (ADVIL) 600 MG tablet  Every 6 hours PRN     10/15/18 0038    HYDROcodone-acetaminophen (NORCO/VICODIN) 5-325 MG tablet  Every 8 hours PRN     10/15/18 0038           Follow Up: [facility]         Fatima Blank, MD 10/15/18 [phone]

## 2019-02-03 ENCOUNTER — Encounter (HOSPITAL_COMMUNITY): Payer: Self-pay

## 2019-02-03 ENCOUNTER — Other Ambulatory Visit: Payer: Self-pay

## 2019-02-03 ENCOUNTER — Ambulatory Visit (HOSPITAL_COMMUNITY)
Admission: EM | Admit: 2019-02-03 | Discharge: 2019-02-03 | Disposition: A | Discharge status: Home / Self Care | Payer: No Typology Code available for payment source | Attending: Family Medicine | Admitting: Family Medicine

## 2019-02-03 ENCOUNTER — Ambulatory Visit
Admission: RE | Admit: 2019-02-03 | Discharge: 2019-02-03 | Disposition: A | Discharge status: Home / Self Care | Payer: No Typology Code available for payment source | Source: Ambulatory Visit

## 2019-02-03 DIAGNOSIS — K0889 Other specified disorders of teeth and supporting structures: Secondary | ICD-10-CM

## 2019-02-03 MED ORDER — AMOXICILLIN 500 MG PO CAPS: 500.0000 mg | ORAL_CAPSULE | Freq: Three times a day (TID) | ORAL | 0 refills | Status: DC

## 2019-02-03 MED ORDER — HYDROCODONE-ACETAMINOPHEN 5-325 MG PO TABS: 2.0000 | ORAL_TABLET | ORAL | 0 refills | Status: DC | PRN

## 2019-02-03 NOTE — ED Triage Notes (Signed)
Pt presents to the UC with dental pain x 1 month. Pt states he woke up today and the left side of his mouth is swelling.

## 2019-02-03 NOTE — Discharge Instructions (Signed)
Follow-up with local dentist Take medicines as prescribed

## 2019-02-03 NOTE — ED Provider Notes (Signed)
[place]    CSN: [id number] Arrival date & time: 02/03/19  1732      History   Chief Complaint Chief Complaint  Patient presents with  . Dental Pain    HPI Leslie Mcmillan is a 48 y.o. male.   Patient lost a feeling and has pain in that tooth.  Does not have a local dentist but has applied for dental insurance through the [place].  He has tried several other medicines including Imitrex for his toothache without relief.  HPI  Past Medical History:  Diagnosis Date  . Anxiety   . Blood in stool 5--31-2015  . Depression   . Diverticulitis   . Gastroduodenitis   . GERD (gastroesophageal reflux disease)    no meds for  . Headache   . Hypertension   . Kidney stones     Patient Active Problem List   Diagnosis Date Noted  . Obesity (BMI 30-39.9) 03/16/2016  . Dog bite of right hand 09/09/2015  . GERD (gastroesophageal reflux disease) 09/09/2015  . Hyperlipidemia 03/12/2015  . Knee pain, bilateral 08/13/2014  . Physical exam 07/14/2014  . Bilateral carpal tunnel syndrome 07/13/2014  . Diarrhea 03/26/2014  . Dental abscess 12/22/2013  . Allergic rhinitis 11/20/2013  . HTN (hypertension) 11/20/2013  . Insomnia 11/13/2013  . Migraine headache without aura 11/13/2013  . Condyloma acuminatum of penis 08/12/2013  . 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 Duodenitis without bleeding 05/09/2013  . Diverticulosis of colon without hemorrhage 05/09/2013    Past Surgical History:  Procedure Laterality Date  . BOWEL RESECTION N/A 08/12/2013   Procedure: SMALL BOWEL ILEAL RESECTION;  Surgeon: Adin Hector, MD;  Location: [facility];  Service: General;  Laterality: N/A;  . COLONOSCOPY N/A 05/09/2013   Procedure: COLONOSCOPY;  Surgeon: Irene Shipper, MD;  Location: [facility];  Service: Endoscopy;  Laterality: N/A;  . ESOPHAGOGASTRODUODENOSCOPY N/A 05/09/2013   Procedure: ESOPHAGOGASTRODUODENOSCOPY (EGD);  Surgeon: Irene Shipper, MD;   Location: [facility];  Service: Endoscopy;  Laterality: N/A;  possible egd depending on colon results  . LAPAROSCOPIC APPENDECTOMY N/A 08/12/2013   Procedure: LAPAROSCOPIC ASSISTED APPENDECTOMY;  Surgeon: Adin Hector, MD;  Location: [facility];  Service: General;  Laterality: N/A;  . TONSILLECTOMY  as child       Home Medications    Prior to Admission medications   Medication Sig Start Date End Date Taking? Authorizing Provider  amitriptyline (ELAVIL) 100 MG tablet Take 100 mg by mouth at bedtime.    [provider]  amoxicillin (AMOXIL) 500 MG capsule Take 1 capsule (500 mg total) by mouth 3 (three) times daily. 02/03/19   Wardell Honour, MD  atorvastatin (LIPITOR) 20 MG tablet Take 1 tablet (20 mg total) by mouth at bedtime. Patient not taking: Reported on 10/15/2018 03/16/15   [name], MD  HYDROcodone-acetaminophen (NORCO/VICODIN) 5-325 MG tablet Take 2 tablets by mouth every 4 (four) hours as needed. 02/03/19   Wardell Honour, MD  ibuprofen (ADVIL) 600 MG tablet Take 1 tablet (600 mg total) by mouth every 6 (six) hours as needed. 10/15/18   Fatima Blank, MD  losartan (COZAAR) 100 MG tablet Take 1 tablet (100 mg total) by mouth daily. 08/13/14   [name], MD  omeprazole (PRILOSEC) 40 MG capsule Take 1 capsule (40 mg total) by mouth daily. Patient not taking: Reported on 10/15/2018 03/16/16   [name], MD  promethazine [facility]) 25  MG tablet Take 25 mg by mouth every 6 (six) hours as needed for nausea or vomiting.    [provider]  sertraline (ZOLOFT) 50 MG tablet Take 2 tablets (100 mg total) by mouth daily. Patient not taking: Reported on 10/15/2018 04/14/14   Pieter Partridge, DO  SUMAtriptan (IMITREX) 25 MG tablet Take 25 mg by mouth once. May repeat in 2 hours if headache persists or recurs.    [provider]  traZODone (DESYREL) 50 MG tablet Take 0.5-1 tablets (25-50 mg total) by mouth at bedtime as needed for  sleep. 07/14/14   [name], MD    Family History Family History  Problem Relation Age of Onset  . Anuerysm Mother        brain  . Diabetes Paternal Grandmother   . Hypertension Paternal Grandmother   . Diabetes Paternal Uncle   . Diabetes Paternal Aunt   . Liver disease Paternal Uncle   . Hypertension Paternal Aunt     Social History Social History   Tobacco Use  . Smoking status: Current Every Day Smoker    Packs/day: 0.50    Years: 10.00    Pack years: 5.00    Types: Cigarettes  . Smokeless tobacco: Never Used  Substance Use Topics  . Alcohol use: Yes    Alcohol/week: 0.0 standard drinks    Comment: 2-3 beers some days  . Drug use: Not Currently    Types: Marijuana     Allergies   Penicillins   Review of Systems Review of Systems  HENT: Positive for dental problem.   All other systems reviewed and are negative.    Physical Exam Triage Vital Signs ED Triage Vitals  Enc Vitals Group     BP 02/03/19 1901 (!) 161/102     Pulse Rate 02/03/19 1901 97     Resp 02/03/19 1901 18     Temp 02/03/19 1901 98.6 F (37 C)     Temp Source 02/03/19 1901 Oral     SpO2 02/03/19 1901 97 %     Weight --      Height --      Head Circumference --      Peak Flow --      Pain Score 02/03/19 1859 10     Pain Loc --      Pain Edu? --      Excl. in [place]? --    No data found.  Updated Vital Signs BP (!) 161/102 (BP Location: Right Arm)   Pulse 97   Temp 98.6 F (37 C) (Oral)   Resp 18   SpO2 97%   Visual Acuity Right Eye Distance:   Left Eye Distance:   Bilateral Distance:    Right Eye Near:   Left Eye Near:    Bilateral Near:     Physical Exam Vitals signs and nursing note reviewed.  Constitutional:      Appearance: Normal appearance.  HENT:     Mouth/Throat:     Comments: Left lower first molar is tender to palpation with tongue blade.  I can see where there is a filling missing.  There does not seem to be any redness or swelling around the  base of that tooth however There is no significant adenopathy associated. Neurological:     Mental Status: He is alert.      UC Treatments / Results  Labs (all labs ordered are listed, but only abnormal results are displayed) Labs Reviewed - No data  to display  EKG   Radiology No results found.  Procedures Procedures (including critical care time)  Medications Ordered in UC Medications - No data to display  Initial Impression / Assessment and Plan / UC Course  I have reviewed the triage vital signs and the nursing notes.  Pertinent labs & imaging results that were available during my care of the patient were reviewed by me and considered in my medical decision making (see chart for details).     Tooothache. Will cover with antibiotic and analgesic Final Clinical Impressions(s) / UC Diagnoses   Final diagnoses:  Pain, dental     Discharge Instructions     Follow-up with local dentist Take medicines as prescribed   ED Prescriptions    Medication Sig Dispense Auth. Provider   amoxicillin (AMOXIL) 500 MG capsule Take 1 capsule (500 mg total) by mouth 3 (three) times daily. 21 capsule Wardell Honour, MD   HYDROcodone-acetaminophen (NORCO/VICODIN) 5-325 MG tablet Take 2 tablets by mouth every 4 (four) hours as needed. 10 tablet Wardell Honour, MD     I have reviewed the PDMP during this encounter.   Wardell Honour, MD 02/03/19 Karl Bales

## 2019-04-25 ENCOUNTER — Emergency Department (HOSPITAL_COMMUNITY)
Admission: EM | Admit: 2019-04-25 | Discharge: 2019-04-25 | Disposition: A | Discharge status: Home / Self Care | Payer: No Typology Code available for payment source | Attending: Emergency Medicine | Admitting: Emergency Medicine

## 2019-04-25 ENCOUNTER — Other Ambulatory Visit: Payer: Self-pay

## 2019-04-25 ENCOUNTER — Encounter (HOSPITAL_COMMUNITY): Payer: Self-pay | Admitting: Emergency Medicine

## 2019-04-25 DIAGNOSIS — R1013 Epigastric pain: Secondary | ICD-10-CM | POA: Diagnosis not present

## 2019-04-25 DIAGNOSIS — I10 Essential (primary) hypertension: Secondary | ICD-10-CM | POA: Diagnosis not present

## 2019-04-25 DIAGNOSIS — R112 Nausea with vomiting, unspecified: Secondary | ICD-10-CM

## 2019-04-25 DIAGNOSIS — F17210 Nicotine dependence, cigarettes, uncomplicated: Secondary | ICD-10-CM | POA: Diagnosis not present

## 2019-04-25 DIAGNOSIS — K920 Hematemesis: Secondary | ICD-10-CM | POA: Insufficient documentation

## 2019-04-25 DIAGNOSIS — K219 Gastro-esophageal reflux disease without esophagitis: Secondary | ICD-10-CM | POA: Insufficient documentation

## 2019-04-25 LAB — CBC WITH DIFFERENTIAL/PLATELET
Abs Immature Granulocytes: 0.04 K/uL (ref 0.00–0.07)
Basophils Absolute: 0 K/uL (ref 0.0–0.1)
Basophils Relative: 0 %
Eosinophils Absolute: 0.2 K/uL (ref 0.0–0.5)
Eosinophils Relative: 2 %
HCT: 41.4 % (ref 39.0–52.0)
Hemoglobin: 13.5 g/dL (ref 13.0–17.0)
Immature Granulocytes: 0 %
Lymphocytes Relative: 33 %
Lymphs Abs: 3 K/uL (ref 0.7–4.0)
MCH: 28.2 pg (ref 26.0–34.0)
MCHC: 32.6 g/dL (ref 30.0–36.0)
MCV: 86.6 fL (ref 80.0–100.0)
Monocytes Absolute: 0.9 K/uL (ref 0.1–1.0)
Monocytes Relative: 9 %
Neutro Abs: 5 K/uL (ref 1.7–7.7)
Neutrophils Relative %: 56 %
Platelets: 258 K/uL (ref 150–400)
RBC: 4.78 MIL/uL (ref 4.22–5.81)
RDW: 13.2 % (ref 11.5–15.5)
WBC: 9.2 K/uL (ref 4.0–10.5)
nRBC: 0 % (ref 0.0–0.2)

## 2019-04-25 LAB — COMPREHENSIVE METABOLIC PANEL
ALT: 27 U/L (ref 0–44)
AST: 21 U/L (ref 15–41)
Albumin: 4 g/dL (ref 3.5–5.0)
Alkaline Phosphatase: 59 U/L (ref 38–126)
Anion gap: 10 (ref 5–15)
BUN: 13 mg/dL (ref 6–20)
CO2: 24 mmol/L (ref 22–32)
Calcium: 9.3 mg/dL (ref 8.9–10.3)
Chloride: 106 mmol/L (ref 98–111)
Creatinine, Ser: 1.18 mg/dL (ref 0.61–1.24)
GFR calc Af Amer: >60 mL/min (ref >60)
GFR calc non Af Amer: >60 mL/min (ref >60)
Glucose, Bld: 108 mg/dL — ABNORMAL HIGH (ref 70–99)
Potassium: 4 mmol/L (ref 3.5–5.1)
Sodium: 140 mmol/L (ref 135–145)
Total Bilirubin: 0.5 mg/dL (ref 0.3–1.2)
Total Protein: 7.2 g/dL (ref 6.5–8.1)

## 2019-04-25 LAB — POC OCCULT BLOOD, ED: Fecal Occult Bld: POSITIVE — AB

## 2019-04-25 LAB — TYPE AND SCREEN
ABO/RH(D): O POS
Antibody Screen: NEGATIVE

## 2019-04-25 LAB — LIPASE, BLOOD: Lipase: 140 U/L — ABNORMAL HIGH (ref 11–51)

## 2019-04-25 MED ORDER — STERILE WATER FOR INJECTION IJ SOLN
INTRAMUSCULAR | Status: AC
  Administered 2019-04-25: 10 mL
  Filled 2019-04-25: qty 10

## 2019-04-25 MED ORDER — PANTOPRAZOLE SODIUM 40 MG PO TBEC: 40.0000 mg | DELAYED_RELEASE_TABLET | Freq: Every day | ORAL | 1 refills | Status: DC

## 2019-04-25 MED ORDER — SODIUM CHLORIDE 0.9 % IV SOLN: INTRAVENOUS | Status: DC

## 2019-04-25 MED ORDER — LOSARTAN POTASSIUM 100 MG PO TABS: 100.0000 mg | ORAL_TABLET | Freq: Every day | ORAL | 1 refills | Status: AC

## 2019-04-25 MED ORDER — PANTOPRAZOLE SODIUM 40 MG IV SOLR
40.0000 mg | Freq: Once | INTRAVENOUS | Status: AC
  Administered 2019-04-25: 40 mg via INTRAVENOUS
  Filled 2019-04-25: qty 40

## 2019-04-25 MED ORDER — PANTOPRAZOLE SODIUM 40 MG PO TBEC: 40.0000 mg | DELAYED_RELEASE_TABLET | Freq: Two times a day (BID) | ORAL | 1 refills | Status: AC

## 2019-04-25 MED ORDER — LABETALOL HCL 5 MG/ML IV SOLN
10.0000 mg | Freq: Once | INTRAVENOUS | Status: AC
  Administered 2019-04-25: 10 mg via INTRAVENOUS
  Filled 2019-04-25: qty 4

## 2019-04-25 NOTE — ED Notes (Signed)
Provider at bedside

## 2019-04-25 NOTE — Consult Note (Addendum)
Referring Provider: No ref. provider found Primary Care Physician:  [name], MD Primary Gastroenterologist:  Dr. Henrene Pastor  Reason for Consultation:  Hematemesis   HPI: Leslie Mcmillan is a 49 y.o. 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  Presented to [facility] ED with complaints of vomiting.  He tells me that this has been happening periodically "for a while" when he drinks some shots of ETOH.  Sounds like one or two episodes and then resolves.  Not sure that he is even really vomiting the blood as he says that he "spits" out blood.  No black or bloody stools to his knowledge.  Has some epigastric discomfort.  Does use NSAID's regularly in the form of Naproxen.  Not on any type of PPI or acid medication at home.    Lipase 140.  BUN normal.  Hgb normal at 13.5 grams.  Heme negative.  Colonoscopy 05/2013 with moderate diverticulosis.  EGD 05/2013 showed mild gastroduodenitis.  Says that he had another colonoscopy at the [place] in 2019 as well.   Past Medical History:  Diagnosis Date  . Anxiety   . Blood in stool 5--31-2015  . Depression   . Diverticulitis   . Gastroduodenitis   . GERD (gastroesophageal reflux disease)    no meds for  . Headache   . Hypertension   . Kidney stones     Past Surgical History:  Procedure Laterality Date  . BOWEL RESECTION N/A 08/12/2013   Procedure: SMALL BOWEL ILEAL RESECTION;  Surgeon: Adin Hector, MD;  Location: [facility];  Service: General;  Laterality: N/A;  . COLONOSCOPY N/A 05/09/2013   Procedure: COLONOSCOPY;  Surgeon: Irene Shipper, MD;  [facility];  Service: Endoscopy;  Laterality: N/A;  . ESOPHAGOGASTRODUODENOSCOPY N/A 05/09/2013   Procedure: ESOPHAGOGASTRODUODENOSCOPY (EGD);  Surgeon: Irene Shipper, MD;  Location: [facility];  Service: Endoscopy;  Laterality: N/A;  possible egd depending on colon results  . LAPAROSCOPIC APPENDECTOMY N/A 08/12/2013   Procedure: LAPAROSCOPIC ASSISTED  APPENDECTOMY;  Surgeon: Adin Hector, MD;  Location: [facility];  Service: General;  Laterality: N/A;  . TONSILLECTOMY  as child    Prior to Admission medications   Medication Sig Start Date End Date Taking? Authorizing Provider  amitriptyline (ELAVIL) 100 MG tablet Take 100 mg by mouth at bedtime.   Yes [provider]  SUMAtriptan (IMITREX) 25 MG tablet Take 25 mg by mouth once. May repeat in 2 hours if headache persists or recurs.   Yes [provider]  traZODone (DESYREL) 50 MG tablet Take 0.5-1 tablets (25-50 mg total) by mouth at bedtime as needed for sleep. 07/14/14  Yes [name], MD  amoxicillin (AMOXIL) 500 MG capsule Take 1 capsule (500 mg total) by mouth 3 (three) times daily. Patient not taking: Reported on 04/25/2019 02/03/19   Wardell Honour, MD  atorvastatin (LIPITOR) 20 MG tablet Take 1 tablet (20 mg total) by mouth at bedtime. Patient not taking: Reported on 10/15/2018 03/16/15   [name], MD  HYDROcodone-acetaminophen (NORCO/VICODIN) 5-325 MG tablet Take 2 tablets by mouth every 4 (four) hours as needed. Patient not taking: Reported on 04/25/2019 02/03/19   Wardell Honour, MD  ibuprofen (ADVIL) 600 MG tablet Take 1 tablet (600 mg total) by mouth every 6 (six) hours as needed. 10/15/18   Fatima Blank, MD  losartan (COZAAR) 100 MG tablet Take 1 tablet (100 mg total) by mouth daily. Patient not  taking: Reported on 04/25/2019 08/13/14   [name], MD  omeprazole (PRILOSEC) 40 MG capsule Take 1 capsule (40 mg total) by mouth daily. Patient not taking: Reported on 10/15/2018 03/16/16   [name], MD  sertraline (ZOLOFT) 50 MG tablet Take 2 tablets (100 mg total) by mouth daily. Patient not taking: Reported on 10/15/2018 04/14/14   Pieter Partridge, DO    Current Facility-Administered Medications  Medication Dose Route Frequency Provider Last Rate Last Admin  . 0.9 %  sodium chloride infusion   Intravenous Continuous  Lacretia Leigh, MD       Current Outpatient Medications  Medication Sig Dispense Refill  . amitriptyline (ELAVIL) 100 MG tablet Take 100 mg by mouth at bedtime.    . SUMAtriptan (IMITREX) 25 MG tablet Take 25 mg by mouth once. May repeat in 2 hours if headache persists or recurs.    . traZODone (DESYREL) 50 MG tablet Take 0.5-1 tablets (25-50 mg total) by mouth at bedtime as needed for sleep. 30 tablet 1  . amoxicillin (AMOXIL) 500 MG capsule Take 1 capsule (500 mg total) by mouth 3 (three) times daily. (Patient not taking: Reported on 04/25/2019) 21 capsule 0  . atorvastatin (LIPITOR) 20 MG tablet Take 1 tablet (20 mg total) by mouth at bedtime. (Patient not taking: Reported on 10/15/2018) 30 tablet 0  . HYDROcodone-acetaminophen (NORCO/VICODIN) 5-325 MG tablet Take 2 tablets by mouth every 4 (four) hours as needed. (Patient not taking: Reported on 04/25/2019) 10 tablet 0  . ibuprofen (ADVIL) 600 MG tablet Take 1 tablet (600 mg total) by mouth every 6 (six) hours as needed. 30 tablet 0  . losartan (COZAAR) 100 MG tablet Take 1 tablet (100 mg total) by mouth daily. (Patient not taking: Reported on 04/25/2019) 30 tablet 6  . omeprazole (PRILOSEC) 40 MG capsule Take 1 capsule (40 mg total) by mouth daily. (Patient not taking: Reported on 10/15/2018) 30 capsule 3  . sertraline (ZOLOFT) 50 MG tablet Take 2 tablets (100 mg total) by mouth daily. (Patient not taking: Reported on 10/15/2018) 60 tablet 3    Allergies as of 04/25/2019  . (No Known Allergies)    Family History  Problem Relation Age of Onset  . Anuerysm Mother        brain  . Diabetes Paternal Grandmother   . Hypertension Paternal Grandmother   . Diabetes Paternal Uncle   . Diabetes Paternal Aunt   . Liver disease Paternal Uncle   . Hypertension Paternal Aunt     Social History   Socioeconomic History  . Marital status: Single    Spouse name: Not on file  . Number of children: 6  . Years of education: Not on file  . Highest  education level: Not on file  Occupational History  . 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 Smoking status: Current Every Day Smoker    Packs/day: 0.50    Years: 10.00    Pack years: 5.00    Types: Cigarettes  . Smokeless tobacco: Never Used  Substance and Sexual Activity  . Alcohol use: Yes    Alcohol/week: 0.0 standard drinks    Comment: 2-3 beers some days  . Drug use: Not Currently    Types: Marijuana  . Sexual activity: Yes    Partners: Female  Other Topics Concern  . Not on file  Social History Narrative  . Not on file  Social Determinants of Health   Financial Resource Strain:   . Difficulty of Paying Living Expenses: Not on file  Food Insecurity:   . Worried About Charity fundraiser in the Last Year: Not on file  . Ran Out of Food in the Last Year: Not on file  Transportation Needs:   . Lack of Transportation (Medical): Not on file  . Lack of Transportation (Non-Medical): Not on file  Physical Activity:   . Days of Exercise per Week: Not on file  . Minutes of Exercise per Session: Not on file  Stress:   . Feeling of Stress : Not on file  Social Connections:   . Frequency of Communication with Friends and Family: Not on file  . Frequency of Social Gatherings with Friends and Family: Not on file  . Attends Religious Services: Not on file  . Active Member of Clubs or Organizations: Not on file  . Attends Archivist Meetings: Not on file  . Marital Status: Not on file  Intimate Partner Violence:   . Fear of Current or Ex-Partner: Not on file  . Emotionally Abused: Not on file  . Physically Abused: Not on file  . Sexually Abused: Not on file    Review of Systems: ROS is O/W negative except as mentioned in HPI.  Physical Exam: Vital signs in last 24 hours: Temp:  [98.3 F (36.8 C)] 98.3 F (36.8 C) (02/19 1259) Pulse Rate:  [83-89] 83 (02/19 1512) Resp:  [12-18] 12 (02/19 1512) BP:  (183-192)/(122-135) 192/135 (02/19 1512) SpO2:  [98 %-100 %] 100 % (02/19 1512)   General:  Alert, Well-developed, well-nourished, pleasant and cooperative in NAD Head:  Normocephalic and atraumatic. Eyes:  Sclera clear, no icterus.  Conjunctiva pink. Ears:  Normal auditory acuity. Mouth:  No deformity or lesions.   Lungs:  Clear throughout to auscultation.  No wheezes, crackles, or rhonchi.  Heart:  Regular rate and rhythm; no murmurs, clicks, rubs, or gallops. Abdomen:  Soft, non-distended.  BS present.  Mild epigastric TTP.   Rectal:  Deferred  Msk:  Symmetrical without gross deformities. Pulses:  Normal pulses noted. Extremities:  Without clubbing or edema. Neurologic:  Alert and oriented x 4;  grossly normal neurologically. Skin:  Intact without significant lesions or rashes. Psych:  Alert and cooperative. Normal mood and affect.  Lab Results: Recent Labs    04/25/19 1342  WBC 9.2  HGB 13.5  HCT 41.4  PLT 258   BMET Recent Labs    04/25/19 1342  NA 140  K 4.0  CL 106  CO2 24  GLUCOSE 108*  BUN 13  CREATININE 1.18  CALCIUM 9.3   LFT Recent Labs    04/25/19 1342  PROT 7.2  ALBUMIN 4.0  AST 21  ALT 27  ALKPHOS 59  BILITOT 0.5   IMPRESSION:  *UGIB/hematemesis:  This happens periodically when he drinks some shots of ETOH.  Sounds like one or two episodes and then resolves.  Not sure that he is even really vomiting the blood as he says that he "spits" out blood.  Hgb normal, BUN normal, and hemoccult negative.  Does use NSAID's regularly.  Slight lipase elevation could be due to the vomiting itself.  He does have some epigastric pain and tenderness, about I am not convinced that he has any significant pancreatitis at this time.  PLAN: -Recommend discharge from the ED on pantoprazole 40 mg daily. -Should discontinue ETOH use for now and avoid NSAID's if possible. -  Outpatient appt has been scheduled for next week at which time we can schedule EGD.  Laban Emperor.  Zehr  04/25/2019, 3:33 PM   ________________________________________________________________________  [facility] MD note:  I personally examined the patient, reviewed the data and agree with the assessment and plan described above.  He binge drinks 2-3 times per week, takes several NSAIDs (naproxen, BC powders, Goody's) for HAs.  He's been vomiting when drinking etoh for the past month or so.  He has noticed sometimes the vomit is blood tinged.  No weigh loss, no melena.  He really wants to go home from the ER.  He abuses alcohol and NSAIDs.  I explained he may have PUD, possibly etoh related gastritis or a combination of the two.  Also lipase is slightly elevated and so he may also have mild etoh related pancreatitis.     He wants to go home and that is probably safe. He is not having acute GI bleeding.  He knows to completely stop etoh drinking, to dramatically cut back his NSAID use. He will be d/c on PPI twice daily.  Bland diet, pushing fluids. He is already scheduled to see PA Zehr next week in our office in follow up.  Owens Loffler, MD [facility] Gastroenterology Pager [phone]

## 2019-04-25 NOTE — Discharge Instructions (Signed)
You were seen in the emergency department for evaluation of vomiting blood.  Your blood work was reassuring.  You were evaluated by lower GI and they felt it was safe you to go home and made some recommendations on starting a medication to help with acid.  They will also set you up with an appointment as an outpatient.  We recommend that you limit your alcohol consumption and stop using NSAIDs like ibuprofen Aleve Advil.  Your blood pressure was also elevated here and this will need to be followed up with your primary care doctor.

## 2019-04-25 NOTE — ED Provider Notes (Signed)
Signout from Dr. Zenia Resides.  49 year old male presenting with 1 upper GI bleeding.  It sounds like he has had this a couple times in the past but never been evaluated for it.  Vitals stable and hemoglobin good.  He is pending a consult by [facility].  Disposition per results of consult. Physical Exam  BP (!) 192/135   Pulse 83   Temp 98.3 F (36.8 C)   Resp 12   SpO2 100%   Physical Exam  ED Course/Procedures     Procedures  MDM  Jessica from [facility] evaluated the patient.  She spoke with her attending regarding the consult.  Their recommendation is that the patient does not need to be admitted for an emergent endoscopy.  They felt the patient would benefit from an outpatient endoscopy.  They recommended having the patient stop taking NSAIDs and put him on a PPI and they will reach out to him for follow-up appointment.  Blood pressure also markedly elevated here.  We will give him IV dose of medication to see if we can lower that.  I asked him about his blood pressure meds and he said he ran out and he was just at the [place] a few days ago and they have refilled him and he just needs to get restarted on them.  He is comfortable with plan for going home and he understands he can return if any acute worsening of his condition.       Hayden Rasmussen, MD 04/26/19 1027

## 2019-04-25 NOTE — ED Provider Notes (Signed)
[facility] DEPT Provider Note   CSN: [id number] Arrival date & time: 04/25/19  1247     History Chief Complaint  Patient presents with  . Abdominal Pain  . Hematemesis    Leslie Mcmillan is a 49 y.o. male.  49 year old male presents with hematemesis x1 day.  Has had 2 episodes of this in the past but never evaluated.  Does admit to alcohol and tobacco use.  Denies any black or bloody stools.  Has had some epigastric abdominal pain.  Denies any dizziness.  He also admits to using naproxen as well.  No syncope or near syncope.  No treatment used prior to arrival        Past Medical History:  Diagnosis Date  . Anxiety   . Blood in stool 5--31-2015  . Depression   . Diverticulitis   . Gastroduodenitis   . GERD (gastroesophageal reflux disease)    no meds for  . Headache   . Hypertension   . Kidney stones     Patient Active Problem List   Diagnosis Date Noted  . Obesity (BMI 30-39.9) 03/16/2016  . Dog bite of right hand 09/09/2015  . GERD (gastroesophageal reflux disease) 09/09/2015  . Hyperlipidemia 03/12/2015  . Knee pain, bilateral 08/13/2014  . Physical exam 07/14/2014  . Bilateral carpal tunnel syndrome 07/13/2014  . Diarrhea 03/26/2014  . Dental abscess 12/22/2013  . Allergic rhinitis 11/20/2013  . HTN (hypertension) 11/20/2013  . Insomnia 11/13/2013  . Migraine headache without aura 11/13/2013  . Condyloma acuminatum of penis 08/12/2013  . 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 Duodenitis without bleeding 05/09/2013  . Diverticulosis of colon without hemorrhage 05/09/2013    Past Surgical History:  Procedure Laterality Date  . BOWEL RESECTION N/A 08/12/2013   Procedure: SMALL BOWEL ILEAL RESECTION;  Surgeon: Adin Hector, MD;  Location: [facility];  Service: General;  Laterality: N/A;  . COLONOSCOPY N/A 05/09/2013   Procedure: COLONOSCOPY;  Surgeon: Irene Shipper, MD;  Location: [facility];  Service: Endoscopy;  Laterality: N/A;  . ESOPHAGOGASTRODUODENOSCOPY N/A 05/09/2013   Procedure: ESOPHAGOGASTRODUODENOSCOPY (EGD);  Surgeon: Irene Shipper, MD;  Location: [facility];  Service: Endoscopy;  Laterality: N/A;  possible egd depending on colon results  . LAPAROSCOPIC APPENDECTOMY N/A 08/12/2013   Procedure: LAPAROSCOPIC ASSISTED APPENDECTOMY;  Surgeon: Adin Hector, MD;  Location: [facility];  Service: General;  Laterality: N/A;  . TONSILLECTOMY  as child       Family History  Problem Relation Age of Onset  . Anuerysm Mother        brain  . Diabetes Paternal Grandmother   . Hypertension Paternal Grandmother   . Diabetes Paternal Uncle   . Diabetes Paternal Aunt   . Liver disease Paternal Uncle   . Hypertension Paternal Aunt     Social History   Tobacco Use  . Smoking status: Current Every Day Smoker    Packs/day: 0.50    Years: 10.00    Pack years: 5.00    Types: Cigarettes  . Smokeless tobacco: Never Used  Substance Use Topics  . Alcohol use: Yes    Alcohol/week: 0.0 standard drinks    Comment: 2-3 beers some days  . Drug use: Not Currently    Types: Marijuana    Home Medications Prior to Admission medications   Medication Sig Start Date End Date Taking? Authorizing Provider  amitriptyline (ELAVIL) 100 MG tablet Take 100 mg by mouth  at bedtime.   Yes [provider]  SUMAtriptan (IMITREX) 25 MG tablet Take 25 mg by mouth once. May repeat in 2 hours if headache persists or recurs.   Yes [provider]  traZODone (DESYREL) 50 MG tablet Take 0.5-1 tablets (25-50 mg total) by mouth at bedtime as needed for sleep. 07/14/14  Yes [name], MD  amoxicillin (AMOXIL) 500 MG capsule Take 1 capsule (500 mg total) by mouth 3 (three) times daily. Patient not taking: Reported on 04/25/2019 02/03/19   Wardell Honour, MD  atorvastatin (LIPITOR) 20 MG tablet Take 1 tablet (20 mg total) by mouth at bedtime. Patient not taking: Reported on  10/15/2018 03/16/15   [name], MD  HYDROcodone-acetaminophen (NORCO/VICODIN) 5-325 MG tablet Take 2 tablets by mouth every 4 (four) hours as needed. Patient not taking: Reported on 04/25/2019 02/03/19   Wardell Honour, MD  ibuprofen (ADVIL) 600 MG tablet Take 1 tablet (600 mg total) by mouth every 6 (six) hours as needed. 10/15/18   Fatima Blank, MD  losartan (COZAAR) 100 MG tablet Take 1 tablet (100 mg total) by mouth daily. Patient not taking: Reported on 04/25/2019 08/13/14   [name], MD  omeprazole (PRILOSEC) 40 MG capsule Take 1 capsule (40 mg total) by mouth daily. Patient not taking: Reported on 10/15/2018 03/16/16   [name], MD  sertraline (ZOLOFT) 50 MG tablet Take 2 tablets (100 mg total) by mouth daily. Patient not taking: Reported on 10/15/2018 04/14/14   Pieter Partridge, DO    Allergies    Patient has no known allergies.  Review of Systems   Review of Systems  All other systems reviewed and are negative.   Physical Exam Updated Vital Signs BP (!) 183/122   Pulse 89   Temp 98.3 F (36.8 C)   Resp 18   SpO2 98%   Physical Exam Vitals and nursing note reviewed.  Constitutional:      General: He is not in acute distress.    Appearance: Normal appearance. He is well-developed. He is not toxic-appearing.  HENT:     Head: Normocephalic and atraumatic.  Eyes:     General: Lids are normal.     Conjunctiva/sclera: Conjunctivae normal.     Pupils: Pupils are equal, round, and reactive to light.  Neck:     Thyroid: No thyroid mass.     Trachea: No tracheal deviation.  Cardiovascular:     Rate and Rhythm: Normal rate and regular rhythm.     Heart sounds: Normal heart sounds. No murmur. No gallop.   Pulmonary:     Effort: Pulmonary effort is normal. No respiratory distress.     Breath sounds: Normal breath sounds. No stridor. No decreased breath sounds, wheezing, rhonchi or rales.  Abdominal:     General: Bowel sounds are normal.  There is no distension.     Palpations: Abdomen is soft.     Tenderness: There is no abdominal tenderness. There is no rebound.    Musculoskeletal:        General: No tenderness. Normal range of motion.     Cervical back: Normal range of motion and neck supple.  Skin:    General: Skin is warm and dry.     Findings: No abrasion or rash.  Neurological:     Mental Status: He is alert and oriented to person, place, and time.     GCS: GCS eye subscore is 4. GCS verbal subscore is 5.  GCS motor subscore is 6.     Cranial Nerves: No cranial nerve deficit.     Sensory: No sensory deficit.  Psychiatric:        Speech: Speech normal.        Behavior: Behavior normal.     ED Results / Procedures / Treatments   Labs (all labs ordered are listed, but only abnormal results are displayed) Labs Reviewed  CBC WITH DIFFERENTIAL/PLATELET  COMPREHENSIVE METABOLIC PANEL  LIPASE, BLOOD  POC OCCULT BLOOD, ED  TYPE AND SCREEN    EKG None  Radiology No results found.  Procedures Procedures (including critical care time)  Medications Ordered in ED Medications  0.9 %  sodium chloride infusion (has no administration in time range)    ED Course  I have reviewed the triage vital signs and the nursing notes.  Pertinent labs & imaging results that were available during my care of the patient were reviewed by me and considered in my medical decision making (see chart for details).    MDM Rules/Calculators/A&P                      Patient with brown guaiac negative stool here.  Given Protonix for suspected upper GI bleed.  Patient hemoglobin is stable.  Mild elevation of his lipase likely from his vomiting.  Discussed with GI will come see Final Clinical Impression(s) / ED Diagnoses Final diagnoses:  None    Rx / DC Orders ED Discharge Orders    None       Lacretia Leigh, MD 04/25/19 [phone]

## 2019-04-25 NOTE — ED Triage Notes (Signed)
Patient reports one episode of vomiting with bright red blood and abdominal pain yesterday. States also "a few" episodes of same over the last month. Denies blood in stool.

## 2019-05-01 ENCOUNTER — Ambulatory Visit: Payer: Self-pay | Admitting: Gastroenterology

## 2020-04-14 ENCOUNTER — Other Ambulatory Visit (HOSPITAL_COMMUNITY): Payer: Self-pay | Admitting: Family Medicine

## 2020-11-01 ENCOUNTER — Other Ambulatory Visit: Payer: Self-pay

## 2020-11-01 ENCOUNTER — Ambulatory Visit: Payer: Self-pay | Admitting: Physician Assistant

## 2020-11-01 VITALS — BP 123/84 | HR 100 | Temp 98.6°F | Resp 18 | Ht 69.0 in | Wt 250.0 lb

## 2020-11-01 DIAGNOSIS — F1410 Cocaine abuse, uncomplicated: Secondary | ICD-10-CM

## 2020-11-01 DIAGNOSIS — F4310 Post-traumatic stress disorder, unspecified: Secondary | ICD-10-CM

## 2020-11-01 DIAGNOSIS — K760 Fatty (change of) liver, not elsewhere classified: Secondary | ICD-10-CM

## 2020-11-01 DIAGNOSIS — I10 Essential (primary) hypertension: Secondary | ICD-10-CM

## 2020-11-01 DIAGNOSIS — F1010 Alcohol abuse, uncomplicated: Secondary | ICD-10-CM

## 2020-11-01 DIAGNOSIS — F330 Major depressive disorder, recurrent, mild: Secondary | ICD-10-CM

## 2020-11-01 DIAGNOSIS — G43009 Migraine without aura, not intractable, without status migrainosus: Secondary | ICD-10-CM

## 2020-11-01 DIAGNOSIS — F411 Generalized anxiety disorder: Secondary | ICD-10-CM

## 2020-11-01 MED ORDER — SERTRALINE HCL 50 MG PO TABS: 50.0000 mg | ORAL_TABLET | Freq: Every day | ORAL | 1 refills | Status: AC

## 2020-11-01 MED ORDER — TRAZODONE HCL 50 MG PO TABS: 25.0000 mg | ORAL_TABLET | Freq: Every evening | ORAL | 1 refills | Status: AC | PRN

## 2020-11-01 NOTE — Progress Notes (Signed)
New Patient Office Visit  Subjective:  Patient ID: Leslie Mcmillan, male    DOB: 1971-01-06  Age: 50 y.o. MRN: [id number]  CC: No chief complaint on file.   HPI Leslie Mcmillan reports that he has been at [name] residential treatment center for substance abuse since August night.  Reports that he does plan to return to his home in [place] after he completes his treatment program.  Reports that he has his primary care at the [facility] in [place], states that he last had an office visit with them in March 2022.  Reports that he has been out of his Zoloft for approximately the past year.  Reports that he had difficulty following up with psychiatry.  Reports that he has also been out of trazodone for approximately the same length of time.  Reports that he feels he is having depressed moods, increased anxiety, and difficulty sleeping.  Reports that he has been using melatonin and Benadryl without relief of insomnia.   Past Medical History:  Diagnosis Date   Anxiety    Blood in stool 5--31-2015   Depression    Diverticulitis    Gastroduodenitis    GERD (gastroesophageal reflux disease)    no meds for   Headache    Hypertension    Kidney stones     Past Surgical History:  Procedure Laterality Date   BOWEL RESECTION N/A 08/12/2013   Procedure: SMALL BOWEL ILEAL RESECTION;  Surgeon: Adin Hector, MD;  Location: [facility];  Service: General;  Laterality: N/A;   COLONOSCOPY N/A 05/09/2013   Procedure: COLONOSCOPY;  Surgeon: Irene Shipper, MD;  Location: [facility];  Service: Endoscopy;  Laterality: N/A;   ESOPHAGOGASTRODUODENOSCOPY N/A 05/09/2013   Procedure: ESOPHAGOGASTRODUODENOSCOPY (EGD);  Surgeon: Irene Shipper, MD;  Location: [facility];  Service: Endoscopy;  Laterality: N/A;  possible egd depending on colon results   LAPAROSCOPIC APPENDECTOMY N/A 08/12/2013   Procedure: LAPAROSCOPIC ASSISTED APPENDECTOMY;  Surgeon: Adin Hector, MD;  Location: [facility];  Service: General;   Laterality: N/A;   TONSILLECTOMY  as child    Family History  Problem Relation Age of Onset   Anuerysm Mother        brain   Diabetes Paternal Grandmother    Hypertension Paternal Grandmother    Diabetes Paternal Uncle    Diabetes Paternal Aunt    Liver disease Paternal Uncle    Hypertension Paternal Aunt     Social History   Socioeconomic History   Marital status: Single    Spouse name: Not on file   Number of children: 6   Years of education: Not on file   Highest education level: Not on file  Occupational History   Occupation: Corporate treasurer: [employer]    Comment: [employer] men and a truck  Tobacco Use   Smoking status: Every Day    Packs/day: 0.50    Years: 10.00    Pack years: 5.00    Types: Cigarettes   Smokeless tobacco: Never  Vaping Use   Vaping Use: Never used  Substance and Sexual Activity   Alcohol use: Yes    Alcohol/week: 0.0 standard drinks    Comment: 2-3 beers some days   Drug use: Not Currently    Types: Marijuana   Sexual activity: Yes    Partners: Female  Other Topics Concern   Not on file  Social History Narrative   Not on file   Social Determinants of Radio broadcast assistant  Strain: Not on file  Food Insecurity: Not on file  Transportation Needs: Not on file  Physical Activity: Not on file  Stress: Not on file  Social Connections: Not on file  Intimate Partner Violence: Not on file    ROS Review of Systems  Constitutional: Negative.   HENT: Negative.    Eyes: Negative.   Respiratory:  Negative for shortness of breath.   Cardiovascular:  Negative for chest pain.  Endocrine: Negative.   Genitourinary: Negative.   Musculoskeletal: Negative.   Skin: Negative.   Allergic/Immunologic: Negative.   Neurological: Negative.   Hematological: Negative.   Psychiatric/Behavioral:  Positive for dysphoric mood and sleep disturbance. Negative for self-injury and suicidal ideas. The patient is nervous/anxious.    Objective:    Today's Vitals: BP 123/84 (BP Location: Left Arm, Patient Position: Sitting, Cuff Size: Large)   Pulse 100   Temp 98.6 F (37 C) (Oral)   Resp 18   Ht '5\' 9"'$  (1.753 m)   Wt 250 lb (113.4 kg)   SpO2 95%   BMI 36.92 kg/m   Physical Exam Vitals and nursing note reviewed.  Constitutional:      Appearance: Normal appearance.  HENT:     Head: Normocephalic and atraumatic.     Right Ear: External ear normal.     Left Ear: External ear normal.     Nose: Nose normal.     Mouth/Throat:     Mouth: Mucous membranes are moist.  Eyes:     Extraocular Movements: Extraocular movements intact.     Conjunctiva/sclera: Conjunctivae normal.     Pupils: Pupils are equal, round, and reactive to light.  Cardiovascular:     Rate and Rhythm: Normal rate and regular rhythm.     Pulses: Normal pulses.     Heart sounds: Normal heart sounds.  Pulmonary:     Effort: Pulmonary effort is normal.     Breath sounds: Normal breath sounds.  Musculoskeletal:        General: Normal range of motion.     Cervical back: Normal range of motion and neck supple.  Skin:    General: Skin is warm and dry.  Neurological:     General: No focal deficit present.     Mental Status: He is alert and oriented to person, place, and time.  Psychiatric:        Attention and Perception: Attention normal.        Mood and Affect: Mood normal.        Speech: Speech normal.        Behavior: Behavior normal.        Thought Content: Thought content normal.        Cognition and Memory: Cognition normal.        Judgment: Judgment normal.    Assessment & Plan:   Problem List Items Addressed This Visit       Cardiovascular and Mediastinum   Migraine headache without aura   Relevant Medications   sertraline (ZOLOFT) 50 MG tablet   traZODone (DESYREL) 50 MG tablet   HTN (hypertension)     Digestive   Fatty liver     Other   Mild episode of recurrent major depressive disorder (HCC) - Primary   Relevant Medications    sertraline (ZOLOFT) 50 MG tablet   traZODone (DESYREL) 50 MG tablet   GAD (generalized anxiety disorder)   Relevant Medications   sertraline (ZOLOFT) 50 MG tablet   traZODone (DESYREL) 50 MG tablet  Alcohol abuse   Cocaine abuse ([place])   Other Visit Diagnoses     PTSD (post-traumatic stress disorder)       Relevant Medications   sertraline (ZOLOFT) 50 MG tablet   traZODone (DESYREL) 50 MG tablet       Outpatient Encounter Medications as of 11/01/2020  Medication Sig   amitriptyline (ELAVIL) 100 MG tablet Take 100 mg by mouth at bedtime.   atorvastatin (LIPITOR) 20 MG tablet Take 1 tablet (20 mg total) by mouth at bedtime.   losartan (COZAAR) 100 MG tablet Take 1 tablet (100 mg total) by mouth daily.   omeprazole (PRILOSEC) 40 MG capsule Take 1 capsule (40 mg total) by mouth daily.   pantoprazole (PROTONIX) 40 MG tablet Take 1 tablet (40 mg total) by mouth 2 (two) times daily.   SUMAtriptan (IMITREX) 25 MG tablet Take 25 mg by mouth once. May repeat in 2 hours if headache persists or recurs.   [DISCONTINUED] sertraline (ZOLOFT) 50 MG tablet Take 2 tablets (100 mg total) by mouth daily.   [DISCONTINUED] traZODone (DESYREL) 50 MG tablet Take 0.5-1 tablets (25-50 mg total) by mouth at bedtime as needed for sleep.   ibuprofen (ADVIL) 600 MG tablet Take 1 tablet (600 mg total) by mouth every 6 (six) hours as needed. (Patient not taking: Reported on 11/01/2020)   sertraline (ZOLOFT) 50 MG tablet Take 1 tablet (50 mg total) by mouth daily.   traZODone (DESYREL) 50 MG tablet Take 0.5-1 tablets (25-50 mg total) by mouth at bedtime as needed for sleep.   [DISCONTINUED] amoxicillin (AMOXIL) 500 MG capsule Take 1 capsule (500 mg total) by mouth 3 (three) times daily. (Patient not taking: Reported on 04/25/2019)   [DISCONTINUED] azithromycin (ZITHROMAX) 500 MG tablet TAKE 2 TABLETS BY MOUTH FOR 1 DOSE   [DISCONTINUED] HYDROcodone-acetaminophen (NORCO/VICODIN) 5-325 MG tablet Take 2 tablets by mouth  every 4 (four) hours as needed. (Patient not taking: Reported on 04/25/2019)   No facility-administered encounter medications on file as of 11/01/2020.   1. Mild episode of recurrent major depressive disorder (HCC) Resume Zoloft 50 mg.  Patient was previously taking 100 mg.  Patient encouraged to return to mobile unit or follow-up with primary care provider in 2 weeks for possible titration.  2. GAD (generalized anxiety disorder) Resume trazodone as needed - sertraline (ZOLOFT) 50 MG tablet; Take 1 tablet (50 mg total) by mouth daily.  Dispense: 30 tablet; Refill: 1 - traZODone (DESYREL) 50 MG tablet; Take 0.5-1 tablets (25-50 mg total) by mouth at bedtime as needed for sleep.  Dispense: 30 tablet; Refill: 1  3. PTSD (post-traumatic stress disorder)   4. Primary hypertension Continue [facility] regimen[facility] [place] no refills needed today  5. Migraine without aura and without status migrainosus, not intractable Continue current regimen, [facility] [place], no refills needed today  6. Fatty liver   7. Alcohol abuse Currently in residential treatment program  8. Cocaine abuse ([place])    I have reviewed the patient's medical history (PMH, PSH, Social History, Family History, Medications, and allergies) , and have been updated if relevant. I spent 30 minutes reviewing chart and  face to face time with patient.    Follow-up: Return in about 2 weeks (around 11/15/2020).   Loraine Grip Mayers, PA-C

## 2020-11-01 NOTE — Progress Notes (Signed)
Patient has eaten today. Patient has taken medication today. Patient denies pain at this time.

## 2020-11-01 NOTE — Patient Instructions (Signed)
To help with your depression and anxiety, you will resume Zoloft, you will start at 50 mg once a day.  To help with your insomnia, you will resume trazodone, you can take 1/2-1 full tablet at bedtime as needed.  I do encourage you to continue checking your blood pressure on a daily basis, keeping a written log and have available for all office visits.  Please return to the mobile unit in 2 weeks or follow-up with your primary care provider at the [facility].  Kennieth Rad, PA-C Physician Assistant [facility] [email or web address]   Insomnia Insomnia is a sleep disorder that makes it difficult to fall asleep or stay asleep. Insomnia can cause fatigue, low energy, difficulty concentrating, moodswings, and poor performance at work or school. There are three different ways to classify insomnia: Difficulty falling asleep. Difficulty staying asleep. Waking up too early in the morning. Any type of insomnia can be long-term (chronic) or short-term (acute). Both are common. Short-term insomnia usually lasts for three months or less. Chronic insomnia occurs at least three times a week for longer than threemonths. What are the causes? Insomnia may be caused by another condition, situation, or substance, such as: Anxiety. Certain medicines. Gastroesophageal reflux disease (GERD) or other gastrointestinal conditions. Asthma or other breathing conditions. Restless legs syndrome, sleep apnea, or other sleep disorders. Chronic pain. Menopause. Stroke. Abuse of alcohol, tobacco, or illegal drugs. Mental health conditions, such as depression. Caffeine. Neurological disorders, such as Alzheimer's disease. An overactive thyroid (hyperthyroidism). Sometimes, the cause of insomnia may not be known. What increases the risk? Risk factors for insomnia include: Gender. Women are affected more often than men. Age. Insomnia is more common as you get  older. Stress. Lack of exercise. Irregular work schedule or working night shifts. Traveling between different time zones. Certain medical and mental health conditions. What are the signs or symptoms? If you have insomnia, the main symptom is having trouble falling asleep or having trouble staying asleep. This may lead to other symptoms, such as: Feeling fatigued or having low energy. Feeling nervous about going to sleep. Not feeling rested in the morning. Having trouble concentrating. Feeling irritable, anxious, or depressed. How is this diagnosed? This condition may be diagnosed based on: Your symptoms and medical history. Your health care provider may ask about: Your sleep habits. Any medical conditions you have. Your mental health. A physical exam. How is this treated? Treatment for insomnia depends on the cause. Treatment may focus on treating an underlying condition that is causing insomnia. Treatment may also include: Medicines to help you sleep. Counseling or therapy. Lifestyle adjustments to help you sleep better. Follow these instructions at home: Eating and drinking  Limit or avoid alcohol, caffeinated beverages, and cigarettes, especially close to bedtime. These can disrupt your sleep. Do not eat a large meal or eat spicy foods right before bedtime. This can lead to digestive discomfort that can make it hard for you to sleep.  Sleep habits  Keep a sleep diary to help you and your health care provider figure out what could be causing your insomnia. Write down: When you sleep. When you wake up during the night. How well you sleep. How rested you feel the next day. Any side effects of medicines you are taking. What you eat and drink. Make your bedroom a dark, comfortable place where it is easy to fall asleep. Put up shades or blackout curtains to block light from outside. Use a white noise machine to block noise. Keep  the temperature cool. Limit screen use before  bedtime. This includes: Watching TV. Using your smartphone, tablet, or computer. Stick to a routine that includes going to bed and waking up at the same times every day and night. This can help you fall asleep faster. Consider making a quiet activity, such as reading, part of your nighttime routine. Try to avoid taking naps during the day so that you sleep better at night. Get out of bed if you are still awake after 15 minutes of trying to sleep. Keep the lights down, but try reading or doing a quiet activity. When you feel sleepy, go back to bed.  General instructions Take over-the-counter and prescription medicines only as told by your health care provider. Exercise regularly, as told by your health care provider. Avoid exercise starting several hours before bedtime. Use relaxation techniques to manage stress. Ask your health care provider to suggest some techniques that may work well for you. These may include: Breathing exercises. Routines to release muscle tension. Visualizing peaceful scenes. Make sure that you drive carefully. Avoid driving if you feel very sleepy. Keep all follow-up visits as told by your health care provider. This is important. Contact a health care provider if: You are tired throughout the day. You have trouble in your daily routine due to sleepiness. You continue to have sleep problems, or your sleep problems get worse. Get help right away if: You have serious thoughts about hurting yourself or someone else. If you ever feel like you may hurt yourself or others, or have thoughts about taking your own life, get help right away. You can go to your nearest emergency department or call: Your local emergency services (911 in the [place] A suicide crisis helpline, such as the [facility] at 458 624 7328. This is open 24 hours a day. Summary Insomnia is a sleep disorder that makes it difficult to fall asleep or stay asleep. Insomnia can be  long-term (chronic) or short-term (acute). Treatment for insomnia depends on the cause. Treatment may focus on treating an underlying condition that is causing insomnia. Keep a sleep diary to help you and your health care provider figure out what could be causing your insomnia. This information is not intended to replace advice given to you by your health care provider. Make sure you discuss any questions you have with your healthcare provider. Document Revised: 01/01/2020 Document Reviewed: 01/01/2020 Elsevier Patient Education  2022 [employer].

## 2020-11-02 DIAGNOSIS — K760 Fatty (change of) liver, not elsewhere classified: Secondary | ICD-10-CM | POA: Insufficient documentation

## 2020-11-02 DIAGNOSIS — F330 Major depressive disorder, recurrent, mild: Secondary | ICD-10-CM | POA: Insufficient documentation

## 2020-11-02 DIAGNOSIS — F1010 Alcohol abuse, uncomplicated: Secondary | ICD-10-CM | POA: Insufficient documentation

## 2020-11-02 DIAGNOSIS — F411 Generalized anxiety disorder: Secondary | ICD-10-CM | POA: Insufficient documentation

## 2020-11-02 DIAGNOSIS — F1410 Cocaine abuse, uncomplicated: Secondary | ICD-10-CM | POA: Insufficient documentation

## 2020-11-22 ENCOUNTER — Ambulatory Visit (HOSPITAL_COMMUNITY): Payer: No Payment, Other | Admitting: Student in an Organized Health Care Education/Training Program

## 2020-12-07 ENCOUNTER — Other Ambulatory Visit: Payer: Self-pay

## 2020-12-21 ENCOUNTER — Ambulatory Visit (HOSPITAL_COMMUNITY): Payer: No Payment, Other | Admitting: Licensed Clinical Social Worker

## 2021-05-26 ENCOUNTER — Emergency Department (HOSPITAL_COMMUNITY): Payer: No Typology Code available for payment source

## 2021-05-26 ENCOUNTER — Other Ambulatory Visit: Payer: Self-pay

## 2021-05-26 ENCOUNTER — Emergency Department (HOSPITAL_COMMUNITY)
Admission: EM | Admit: 2021-05-26 | Discharge: 2021-05-27 | Disposition: A | Discharge status: Home / Self Care | Payer: No Typology Code available for payment source | Attending: Emergency Medicine | Admitting: Emergency Medicine

## 2021-05-26 ENCOUNTER — Encounter (HOSPITAL_COMMUNITY): Payer: Self-pay

## 2021-05-26 DIAGNOSIS — R710 Precipitous drop in hematocrit: Secondary | ICD-10-CM | POA: Insufficient documentation

## 2021-05-26 DIAGNOSIS — R944 Abnormal results of kidney function studies: Secondary | ICD-10-CM | POA: Insufficient documentation

## 2021-05-26 DIAGNOSIS — M545 Low back pain, unspecified: Secondary | ICD-10-CM | POA: Diagnosis not present

## 2021-05-26 DIAGNOSIS — R197 Diarrhea, unspecified: Secondary | ICD-10-CM | POA: Diagnosis not present

## 2021-05-26 DIAGNOSIS — R1032 Left lower quadrant pain: Secondary | ICD-10-CM | POA: Insufficient documentation

## 2021-05-26 DIAGNOSIS — Z79899 Other long term (current) drug therapy: Secondary | ICD-10-CM | POA: Diagnosis not present

## 2021-05-26 DIAGNOSIS — R1030 Lower abdominal pain, unspecified: Secondary | ICD-10-CM

## 2021-05-26 DIAGNOSIS — R102 Pelvic and perineal pain: Secondary | ICD-10-CM | POA: Insufficient documentation

## 2021-05-26 DIAGNOSIS — R748 Abnormal levels of other serum enzymes: Secondary | ICD-10-CM | POA: Insufficient documentation

## 2021-05-26 LAB — HEPATIC FUNCTION PANEL
ALT: 41 U/L (ref 0–44)
AST: 28 U/L (ref 15–41)
Albumin: 4 g/dL (ref 3.5–5.0)
Alkaline Phosphatase: 73 U/L (ref 38–126)
Bilirubin, Direct: 0.1 mg/dL (ref 0.0–0.2)
Indirect Bilirubin: 0.3 mg/dL (ref 0.3–0.9)
Total Bilirubin: 0.4 mg/dL (ref 0.3–1.2)
Total Protein: 7.3 g/dL (ref 6.5–8.1)

## 2021-05-26 LAB — BASIC METABOLIC PANEL
Anion gap: 6 (ref 5–15)
BUN: 8 mg/dL (ref 6–20)
CO2: 27 mmol/L (ref 22–32)
Calcium: 9.1 mg/dL (ref 8.9–10.3)
Chloride: 106 mmol/L (ref 98–111)
Creatinine, Ser: 1.29 mg/dL — ABNORMAL HIGH (ref 0.61–1.24)
GFR, Estimated: >60 mL/min (ref >60)
Glucose, Bld: 129 mg/dL — ABNORMAL HIGH (ref 70–99)
Potassium: 3.7 mmol/L (ref 3.5–5.1)
Sodium: 139 mmol/L (ref 135–145)

## 2021-05-26 LAB — URINALYSIS, ROUTINE W REFLEX MICROSCOPIC
Bacteria, UA: NONE SEEN
Bilirubin Urine: NEGATIVE
Glucose, UA: NEGATIVE mg/dL
Hgb urine dipstick: NEGATIVE
Ketones, ur: 5 mg/dL — AB
Leukocytes,Ua: NEGATIVE
Nitrite: NEGATIVE
Protein, ur: NEGATIVE mg/dL
Specific Gravity, Urine: 1.027 (ref 1.005–1.030)
pH: 5 (ref 5.0–8.0)

## 2021-05-26 LAB — CBC
HCT: 46.4 % (ref 39.0–52.0)
Hemoglobin: 15.4 g/dL (ref 13.0–17.0)
MCH: 28.5 pg (ref 26.0–34.0)
MCHC: 33.2 g/dL (ref 30.0–36.0)
MCV: 85.8 fL (ref 80.0–100.0)
Platelets: 273 10*3/uL (ref 150–400)
RBC: 5.41 MIL/uL (ref 4.22–5.81)
RDW: 13.5 % (ref 11.5–15.5)
WBC: 7.9 10*3/uL (ref 4.0–10.5)
nRBC: 0 % (ref 0.0–0.2)

## 2021-05-26 LAB — LIPASE, BLOOD: Lipase: 38 U/L (ref 11–51)

## 2021-05-26 MED ORDER — IOHEXOL 300 MG/ML  SOLN
100.0000 mL | Freq: Once | INTRAMUSCULAR | Status: AC | PRN
  Administered 2021-05-26: 100 mL via INTRAVENOUS

## 2021-05-26 MED ORDER — SODIUM CHLORIDE 0.9 % IV BOLUS
1000.0000 mL | Freq: Once | INTRAVENOUS | Status: AC
  Administered 2021-05-26: 1000 mL via INTRAVENOUS

## 2021-05-26 MED ORDER — MORPHINE SULFATE (PF) 4 MG/ML IV SOLN
4.0000 mg | Freq: Once | INTRAVENOUS | Status: AC
  Administered 2021-05-26: 4 mg via INTRAVENOUS
  Filled 2021-05-26: qty 1

## 2021-05-26 MED ORDER — ONDANSETRON HCL 4 MG/2ML IJ SOLN
4.0000 mg | Freq: Once | INTRAMUSCULAR | Status: AC
  Administered 2021-05-26: 4 mg via INTRAVENOUS
  Filled 2021-05-26: qty 2

## 2021-05-26 NOTE — ED Provider Notes (Signed)
?[facility]PT ?Provider Note ? ? ?CSN: [id number] ?Arrival date & time: 05/26/21  2205 ? ?  ?History ? ?Chief Complaint  ?Patient presents with  ? Pelvic Pain  ? Back Pain  ? ? ?Leslie Mcmillan is a 51 y.o. male here for evaluation of lower abdominal pain, worse left lower quadrant.  Began on Sunday.  Pain constant in nature.  No dysuria, hematuria.  Has some lower back pain as well.  Denies history of AAA, dissection.  No numbness or weakness to extremities.  No recent injury or trauma.  He denies any dysuria, hematuria.  Does have history of kidney stones however he is unsure if this feels similar.  He is unsure if he has a history of diverticulitis.  No melena or bright blood per rectum.  No fever, nausea, vomiting, chest pain, shortness of breath.  No Meds PTA. Has noted few episodes of diarrhea since abd pain onset. No recent abx, travel, sick contacts. No scrotal pain, swelling, redness. ? ?HPI ? ?  ? ?Home Medications ?Prior to Admission medications   ?Medication Sig Start Date End Date Taking? Authorizing Provider  ?dicyclomine (BENTYL) 20 MG tablet Take 1 tablet (20 mg total) by mouth 2 (two) times daily. 05/27/21  Yes Nazyia Gaugh A, PA-C  ?ondansetron (ZOFRAN-ODT) 4 MG disintegrating tablet Take 1 tablet (4 mg total) by mouth every 8 (eight) hours as needed for nausea or vomiting. 05/27/21  Yes Jorryn Hershberger A, PA-C  ?amitriptyline (ELAVIL) 100 MG tablet Take 100 mg by mouth at bedtime.    [provider]  ?atorvastatin (LIPITOR) 20 MG tablet Take 1 tablet (20 mg total) by mouth at bedtime. 03/16/15   [name], MD  ?ibuprofen (ADVIL) 600 MG tablet Take 1 tablet (600 mg total) by mouth every 6 (six) hours as needed. ?Patient not taking: Reported on 11/01/2020 10/15/18   Fatima Blank, MD  ?losartan (COZAAR) 100 MG tablet Take 1 tablet (100 mg total) by mouth daily. 04/25/19   Hayden Rasmussen, MD  ?omeprazole (PRILOSEC) 40 MG capsule Take 1  capsule (40 mg total) by mouth daily. 03/16/16   [name], MD  ?pantoprazole (PROTONIX) 40 MG tablet Take 1 tablet (40 mg total) by mouth 2 (two) times daily. 04/25/19   Hayden Rasmussen, MD  ?sertraline (ZOLOFT) 50 MG tablet Take 1 tablet (50 mg total) by mouth daily. 11/01/20   Mayers, Loraine Grip, PA-C  ?SUMAtriptan (IMITREX) 25 MG tablet Take 25 mg by mouth once. May repeat in 2 hours if headache persists or recurs.    [provider]  ?traZODone (DESYREL) 50 MG tablet Take 0.5-1 tablets (25-50 mg total) by mouth at bedtime as needed for sleep. 11/01/20   Mayers, Loraine Grip, PA-C  ?   ? ?Allergies    ?Patient has no known allergies.   ? ?Review of Systems   ?Review of Systems  ?Constitutional: Negative.   ?HENT: Negative.    ?Respiratory: Negative.    ?Cardiovascular: Negative.   ?Gastrointestinal:  Positive for abdominal pain and diarrhea. Negative for abdominal distention, anal bleeding, blood in stool, constipation, nausea, rectal pain and vomiting.  ?Genitourinary: Negative.   ?Musculoskeletal:  Positive for back pain.  ?Skin: Negative.   ?Neurological: Negative.   ?All other systems reviewed and are negative. ? ?Physical Exam ?Updated Vital Signs ?BP (!) 144/91   Pulse 82   Temp 98.3 ?F (36.8 ?C) (Oral)   Resp 16   Ht '5\' 5"'$  (1.651  m)   Wt 104.3 kg   SpO2 97%   BMI 38.27 kg/m?  ?Physical Exam ?Vitals and nursing note reviewed.  ?Constitutional:   ?   General: He is not in acute distress. ?   Appearance: He is well-developed. He is not ill-appearing, toxic-appearing or diaphoretic.  ?HENT:  ?   Head: Atraumatic.  ?   Nose: Nose normal.  ?   Mouth/Throat:  ?   Mouth: Mucous membranes are moist.  ?Eyes:  ?   Pupils: Pupils are equal, round, and reactive to light.  ?Cardiovascular:  ?   Rate and Rhythm: Normal rate and regular rhythm.  ?   Pulses: Normal pulses.     ?     Radial pulses are 2+ on the right side and 2+ on the left side.  ?     Dorsalis pedis pulses are 2+ on the right side and 2+ on  the left side.  ?   Heart sounds: Normal heart sounds.  ?Pulmonary:  ?   Effort: Pulmonary effort is normal. No respiratory distress.  ?   Breath sounds: Normal breath sounds.  ?Abdominal:  ?   General: Bowel sounds are normal. There is no distension.  ?   Palpations: Abdomen is soft.  ?   Tenderness: There is abdominal tenderness in the right lower quadrant, suprapubic area and left lower quadrant. There is no right CVA tenderness, left CVA tenderness, guarding or rebound. Negative signs include Murphy's sign and McBurney's sign.  ?   Hernia: No hernia is present.  ?   Comments: Diffuse tenderness to lower abdomen, left lower quadrant, suprapubic greater than right.  Negative CVA tap bilaterally.  No rebound or guarding.  No midline pulsatile abdominal mass.  ?Musculoskeletal:     ?   General: Normal range of motion.  ?   Cervical back: Normal range of motion and neck supple.  ?   Comments: No midline C/T/L tenderness.  Compartments soft.  No bony tenderness  ?Skin: ?   General: Skin is warm and dry.  ?Neurological:  ?   General: No focal deficit present.  ?   Mental Status: He is alert and oriented to person, place, and time.  ? ? ?ED Results / Procedures / Treatments   ?Labs ?(all labs ordered are listed, but only abnormal results are displayed) ?Labs Reviewed  ?URINALYSIS, ROUTINE W REFLEX MICROSCOPIC - Abnormal; Notable for the following components:  ?    Result Value  ? Ketones, ur 5 (*)   ? All other components within normal limits  ?BASIC METABOLIC PANEL - Abnormal; Notable for the following components:  ? Glucose, Bld 129 (*)   ? Creatinine, Ser 1.29 (*)   ? All other components within normal limits  ?CBC  ?LIPASE, BLOOD  ?HEPATIC FUNCTION PANEL  ? ? ?EKG ?None ? ?Radiology ?CT ABDOMEN PELVIS W CONTRAST ? ?Result Date: 05/26/2021 ?CLINICAL DATA:  Left lower quadrant abdominal pain, diarrhea. EXAM: CT ABDOMEN AND PELVIS WITH CONTRAST TECHNIQUE: Multidetector CT imaging of the abdomen and pelvis was performed  using the standard protocol following bolus administration of intravenous contrast. RADIATION DOSE REDUCTION: This exam was performed according to the departmental dose-optimization program which includes automated exposure control, adjustment of the mA and/or kV according to patient size and/or use of iterative reconstruction technique. CONTRAST:  125m OMNIPAQUE IOHEXOL 300 MG/ML  SOLN COMPARISON:  11/15/2017 FINDINGS: Lower chest: No acute abnormality. Hepatobiliary: Mild hepatic steatosis. No enhancing intrahepatic mass. No intra or extrahepatic biliary  ductal dilation. Gallbladder unremarkable. Pancreas: Unremarkable Spleen: Unremarkable Adrenals/Urinary Tract: The adrenal glands are unremarkable. The kidneys are normal in size and position. 1-2 mm punctate nonobstructing calculus within the interpolar region of the right kidney. No hydronephrosis. No ureteral calculi. The bladder is unremarkable. Stomach/Bowel: Mild pancolonic diverticulosis without superimposed acute inflammatory change. Appendectomy and partial small-bowel resection have been performed. The stomach, small bowel, and large bowel are otherwise unremarkable. No evidence of obstruction or focal inflammation. No free intraperitoneal gas or fluid. Vascular/Lymphatic: Aortic atherosclerosis. No enlarged abdominal or pelvic lymph nodes. Reproductive: Prostate is unremarkable. Other: No abdominal wall hernia. Musculoskeletal: No acute bone abnormality. IMPRESSION: No acute intra-abdominal pathology identified. No definite radiographic explanation for the patient's reported symptoms. Mild hepatic steatosis. Minimal nonobstructing right nephrolithiasis. Mild colonic diverticulosis without superimposed acute inflammatory change. Aortic Atherosclerosis (ICD10-I70.0). Electronically Signed   By: Fidela Salisbury M.D.   On: 05/26/2021 23:18   ? ?Procedures ?Procedures  ? ? ?Medications Ordered in ED ?Medications  ?ondansetron (ZOFRAN) injection 4 mg (4 mg  Intravenous Given 05/26/21 2257)  ?morphine (PF) 4 MG/ML injection 4 mg (4 mg Intravenous Given 05/26/21 2258)  ?sodium chloride 0.9 % bolus 1,000 mL (0 mLs Intravenous Stopped 05/27/21 0022)  ?iohexol (OMNIPAQUE)

## 2021-05-26 NOTE — ED Triage Notes (Signed)
Patient presents to ED from home with c/o pelvic and lower back pain that began around Sunday afternoon. Denies urinary issues, N/V, or injury to area.  ?

## 2021-05-27 MED ORDER — DICYCLOMINE HCL 20 MG PO TABS: 20.0000 mg | ORAL_TABLET | Freq: Two times a day (BID) | ORAL | 0 refills | Status: AC

## 2021-05-27 MED ORDER — ONDANSETRON 4 MG PO TBDP: 4.0000 mg | ORAL_TABLET | Freq: Three times a day (TID) | ORAL | 0 refills | Status: AC | PRN

## 2021-05-27 NOTE — Discharge Instructions (Addendum)
Take the medications as prescribed.  May also take Imodium for diarrhea. ? ?Follow-up with your primary care provider ? ?Return for any new or worsening symptoms ?

## 2021-07-19 ENCOUNTER — Encounter: Payer: Self-pay | Admitting: Internal Medicine

## 2022-03-07 ENCOUNTER — Emergency Department (HOSPITAL_COMMUNITY)
Admission: EM | Admit: 2022-03-07 | Discharge: 2022-03-07 | Disposition: A | Discharge status: Home / Self Care | Payer: No Typology Code available for payment source | Attending: Emergency Medicine | Admitting: Emergency Medicine

## 2022-03-07 ENCOUNTER — Encounter (HOSPITAL_COMMUNITY): Payer: Self-pay

## 2022-03-07 ENCOUNTER — Other Ambulatory Visit: Payer: Self-pay

## 2022-03-07 DIAGNOSIS — M79671 Pain in right foot: Secondary | ICD-10-CM | POA: Diagnosis not present

## 2022-03-07 DIAGNOSIS — Z79899 Other long term (current) drug therapy: Secondary | ICD-10-CM | POA: Diagnosis not present

## 2022-03-07 DIAGNOSIS — M25512 Pain in left shoulder: Secondary | ICD-10-CM | POA: Insufficient documentation

## 2022-03-07 DIAGNOSIS — I10 Essential (primary) hypertension: Secondary | ICD-10-CM | POA: Insufficient documentation

## 2022-03-07 MED ORDER — COLCHICINE 0.6 MG PO TABS: 0.6000 mg | ORAL_TABLET | Freq: Two times a day (BID) | ORAL | 0 refills | Status: AC

## 2022-03-07 MED ORDER — PREDNISONE 20 MG PO TABS: ORAL_TABLET | ORAL | 0 refills | Status: AC

## 2022-03-07 MED ORDER — IBUPROFEN 600 MG PO TABS: 600.0000 mg | ORAL_TABLET | Freq: Four times a day (QID) | ORAL | 0 refills | Status: AC | PRN

## 2022-03-07 NOTE — Discharge Instructions (Signed)
Blood shoulder pain and right foot pain is likely in the setting of gout.  This is not a definitive diagnosis.  However, drinking alcohol may contribute to gout pain.  Please avoid alcohol use, take medication prescribed, take it with food so it does not upset your stomach.  You may follow-up with your doctor for further care.  Your blood pressure is elevated today, make sure to take your blood pressure regularly and check your blood pressure daily.  Return if you have any concern.

## 2022-03-07 NOTE — ED Triage Notes (Signed)
Patient reports that he has had left shoulder pain x 1 week and right foot pain x 4 days. Patient denies any injury.

## 2022-03-07 NOTE — ED Provider Notes (Signed)
[facility] DEPT Provider Note   CSN: [id number] Arrival date & time: 03/07/22  1057     History  Chief Complaint  Patient presents with   Foot Pain   Shoulder Pain    Graceson Nichelson is a 52 y.o. male.  The history is provided by the patient and medical records. No language interpreter was used.  Foot Pain  Shoulder Pain    52 year old male with significant history of alcohol or tobacco use, hypertension, who presents complaining of joint pain.  Patient report for the past week he has had pain to his left shoulder which he described as a sharp achy sensation worse with certain positions and worse with movement.  Pain is not associate with chest pain shortness of breath lightheadedness or dizziness.  He denies any associate numbness or weakness and denies any specific injury to the shoulder.  For the past 4 days he also noticed pain to his right foot more so to his great toe.  Similar pain is sharp throbbing worse when he tries to walk or when he tried to put on his socks.  He denies any specific treatment tried.  He did not take his blood pressure medication today.  He denies any prior history of gout.  Home Medications Prior to Admission medications   Medication Sig Start Date End Date Taking? Authorizing Provider  amitriptyline (ELAVIL) 100 MG tablet Take 100 mg by mouth at bedtime.    [provider]  atorvastatin (LIPITOR) 20 MG tablet Take 1 tablet (20 mg total) by mouth at bedtime. 03/16/15   [name], MD  dicyclomine (BENTYL) 20 MG tablet Take 1 tablet (20 mg total) by mouth 2 (two) times daily. 05/27/21   Henderly, Britni A, PA-C  ibuprofen (ADVIL) 600 MG tablet Take 1 tablet (600 mg total) by mouth every 6 (six) hours as needed. Patient not taking: Reported on 11/01/2020 10/15/18   Fatima Blank, MD  losartan (COZAAR) 100 MG tablet Take 1 tablet (100 mg total) by mouth daily. 04/25/19   Hayden Rasmussen, MD  omeprazole  (PRILOSEC) 40 MG capsule Take 1 capsule (40 mg total) by mouth daily. 03/16/16   [name], MD  ondansetron (ZOFRAN-ODT) 4 MG disintegrating tablet Take 1 tablet (4 mg total) by mouth every 8 (eight) hours as needed for nausea or vomiting. 05/27/21   Henderly, Britni A, PA-C  pantoprazole (PROTONIX) 40 MG tablet Take 1 tablet (40 mg total) by mouth 2 (two) times daily. 04/25/19   Hayden Rasmussen, MD  sertraline (ZOLOFT) 50 MG tablet Take 1 tablet (50 mg total) by mouth daily. 11/01/20   Mayers, Cari S, PA-C  SUMAtriptan (IMITREX) 25 MG tablet Take 25 mg by mouth once. May repeat in 2 hours if headache persists or recurs.    [provider]  traZODone (DESYREL) 50 MG tablet Take 0.5-1 tablets (25-50 mg total) by mouth at bedtime as needed for sleep. 11/01/20   Mayers, Cari S, PA-C      Allergies    Patient has no known allergies.    Review of Systems   Review of Systems  All other systems reviewed and are negative.   Physical Exam Updated Vital Signs BP (!) 187/129   Pulse 93   Temp 98.2 F (36.8 C) (Oral)   Resp 16   Ht '5\' 9"'$  (1.753 m)   Wt 102.1 kg   SpO2 98%   BMI 33.23 kg/m  Physical Exam Vitals and nursing  note reviewed.  Constitutional:      General: He is not in acute distress.    Appearance: He is well-developed.  HENT:     Head: Atraumatic.  Eyes:     Conjunctiva/sclera: Conjunctivae normal.  Musculoskeletal:        General: Tenderness (Right foot: Exquisite tenderness noted to first MTP with mild edema but no warmth and no deformity noted.  Wrist cap refill, dorsalis pedis pulse palpable, foot compartments soft.) present.     Cervical back: Neck supple.     Comments: Left shoulder: Tenderness about the shoulder joint including the AC and GH  joint on palpation without any obvious deformity no erythema edema or warmth.  Able to range her shoulder both with active and passive range of motion.  No chest wall tenderness.  Skin:    Findings: No rash.   Neurological:     Mental Status: He is alert.     ED Results / Procedures / Treatments   Labs (all labs ordered are listed, but only abnormal results are displayed) Labs Reviewed - No data to display  EKG None  Radiology No results found.  Procedures Procedures    Medications Ordered in ED Medications - No data to display  ED Course/ Medical Decision Making/ A&P                           Medical Decision Making  BP (!) 187/129   Pulse 93   Temp 98.2 F (36.8 C) (Oral)   Resp 16   Ht '5\' 9"'$  (1.753 m)   Wt 102.1 kg   SpO2 98%   BMI 33.23 kg/m   39:52 PM 52 year old male with significant history of alcohol or tobacco use, hypertension, who presents complaining of joint pain.  Patient report for the past week he has had pain to his left shoulder which he described as a sharp achy sensation worse with certain positions and worse with movement.  Pain is not associate with chest pain shortness of breath lightheadedness or dizziness.  He denies any associate numbness or weakness and denies any specific injury to the shoulder.  For the past 4 days he also noticed pain to his right foot more so to his great toe.  Similar pain is sharp throbbing worse when he tries to walk or when he tried to put on his socks.  He denies any specific treatment tried.  He did not take his blood pressure medication today.  He denies any prior history of gout.  On exam, patient is sitting leaning forward with his left shoulder slightly guarded.  On exam, he does have reproducible tenderness to his left shoulder with intact range of motion and no obvious signs of infection or signs of trauma.  He also has tenderness noted to his right foot most significant to the first MTP.  Differential includes gout, strain, sprain, cellulitis, septic joint, fracture or dislocation.  I suspect his symptoms more likely to be gout likely brought on by regular alcohol use.  I have considered labs and imaging but in the  setting of no recent injury, I felt x-ray is not indicated.  Patient was noted to have an elevated blood pressure of 187/129.  He is afebrile, he does not exhibit any shortness of breath and no hypoxia.  Elevated blood pressure likely in the setting of pain and uncontrolled hypertension as patient did not take his blood pressure medication today.  Encourage patient to  resume his medication, decrease alcohol use, will prescribe medications including colchicine, prednisone and ibuprofen for symptom control and will give return precaution.  Patient voiced understanding and agrees with plan.          Final Clinical Impression(s) / ED Diagnoses Final diagnoses:  Acute pain of left shoulder  Right foot pain    Rx / DC Orders ED Discharge Orders          Ordered    colchicine 0.6 MG tablet  2 times daily        03/07/22 1220    predniSONE (DELTASONE) 20 MG tablet        03/07/22 1220    ibuprofen (ADVIL) 600 MG tablet  Every 6 hours PRN        03/07/22 1220              Domenic Moras, PA-C 03/07/22 1222    Valarie Merino, MD 03/07/22 1504

## 2022-03-28 ENCOUNTER — Telehealth: Payer: Medicaid Other | Admitting: Family Medicine

## 2022-03-28 DIAGNOSIS — B9689 Other specified bacterial agents as the cause of diseases classified elsewhere: Secondary | ICD-10-CM

## 2022-03-28 DIAGNOSIS — J208 Acute bronchitis due to other specified organisms: Secondary | ICD-10-CM | POA: Diagnosis not present

## 2022-03-28 MED ORDER — BENZONATATE 100 MG PO CAPS: 100.0000 mg | ORAL_CAPSULE | Freq: Three times a day (TID) | ORAL | 0 refills | Status: AC | PRN

## 2022-03-28 MED ORDER — AMOXICILLIN-POT CLAVULANATE 875-125 MG PO TABS: 1.0000 | ORAL_TABLET | Freq: Two times a day (BID) | ORAL | 0 refills | Status: AC

## 2022-03-28 MED ORDER — ALBUTEROL SULFATE HFA 108 (90 BASE) MCG/ACT IN AERS: 1.0000 | INHALATION_SPRAY | Freq: Four times a day (QID) | RESPIRATORY_TRACT | 0 refills | Status: AC | PRN

## 2022-03-28 MED ORDER — PSEUDOEPH-BROMPHEN-DM 30-2-10 MG/5ML PO SYRP: 5.0000 mL | ORAL_SOLUTION | Freq: Three times a day (TID) | ORAL | 0 refills | Status: AC | PRN

## 2022-03-28 NOTE — Progress Notes (Signed)
Virtual Visit Consent   Leslie Mcmillan, you are scheduled for a virtual visit with a [facility] provider today. Just as with appointments in the office, your consent must be obtained to participate. Your consent will be active for this visit and any virtual visit you may have with one of our providers in the next 365 days. If you have a MyChart account, a copy of this consent can be sent to you electronically.  As this is a virtual visit, video technology does not allow for your provider to perform a traditional examination. This may limit your provider's ability to fully assess your condition. If your provider identifies any concerns that need to be evaluated in person or the need to arrange testing (such as labs, EKG, etc.), we will make arrangements to do so. Although advances in technology are sophisticated, we cannot ensure that it will always work on either your end or our end. If the connection with a video visit is poor, the visit may have to be switched to a telephone visit. With either a video or telephone visit, we are not always able to ensure that we have a secure connection.  By engaging in this virtual visit, you consent to the provision of healthcare and authorize for your insurance to be billed (if applicable) for the services provided during this visit. Depending on your insurance coverage, you may receive a charge related to this service.  I need to obtain your verbal consent now. Are you willing to proceed with your visit today? Leslie Mcmillan has provided verbal consent on 03/28/2022 for a virtual visit (video or telephone). Perlie Mayo, NP  Date: 03/28/2022 12:22 PM  Virtual Visit via Video Note   I, Perlie Mayo, connected with  Leslie Mcmillan  ([id number], [id number]) on 03/28/22 at 12:15 PM EST by a video-enabled telemedicine application and verified that I am speaking with the correct person using two identifiers.  Location: Patient: Virtual Visit Location Patient:  Home Provider: Virtual Visit Location Provider: Home Office   I discussed the limitations of evaluation and management by telemedicine and the availability of in person appointments. The patient expressed understanding and agreed to proceed.    History of Present Illness: Leslie Mcmillan is a 52 y.o. who identifies as a male who was assigned male at birth, and is being seen today for cough and flu like symptoms Body ache, fever, weak, coughing a lot. Onset was last Monday 8 days ago. Associated congestion- mucus coming up, sore throat getting worse. He had about one day of feeling better but then he started getting more congestion in his throat and chest. Does get shortness of breath with cough.  Denies chest pain, ear pain, current fever- feels feverish.    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[facility]) 11/02/2020   GAD (generalized anxiety disorder) 11/02/2020   Fatty liver 11/02/2020   Alcohol abuse 11/02/2020   Cocaine abuse ([facility]) 11/02/2020   Obesity (BMI 30-39.9) 03/16/2016   Dog bite of right hand 09/09/2015   GERD (gastroesophageal reflux disease) 09/09/2015   Hyperlipidemia 03/12/2015   Knee pain, bilateral 08/13/2014   Physical exam 07/14/2014   Bilateral carpal tunnel syndrome 07/13/2014   Diarrhea 03/26/2014   Dental abscess 12/22/2013   Allergic rhinitis 11/20/2013   HTN (hypertension) 11/20/2013   Insomnia 11/13/2013   Migraine headache without aura 11/13/2013   Condyloma acuminatum of penis 08/12/2013   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/12/2013 06/23/2013   Duodenitis without bleeding 05/09/2013   Diverticulosis of colon without hemorrhage 05/09/2013    Allergies: No Known Allergies Medications:  Current Outpatient Medications:    amitriptyline (ELAVIL) 100 MG tablet, Take 100 mg by mouth at bedtime., Disp: , Rfl:    atorvastatin (LIPITOR) 20 MG tablet, Take 1  tablet (20 mg total) by mouth at bedtime., Disp: 30 tablet, Rfl: 0   colchicine 0.6 MG tablet, Take 1 tablet (0.6 mg total) by mouth 2 (two) times daily., Disp: 20 tablet, Rfl: 0   dicyclomine (BENTYL) 20 MG tablet, Take 1 tablet (20 mg total) by mouth 2 (two) times daily., Disp: 20 tablet, Rfl: 0   ibuprofen (ADVIL) 600 MG tablet, Take 1 tablet (600 mg total) by mouth every 6 (six) hours as needed., Disp: 30 tablet, Rfl: 0   losartan (COZAAR) 100 MG tablet, Take 1 tablet (100 mg total) by mouth daily., Disp: 30 tablet, Rfl: 1   omeprazole (PRILOSEC) 40 MG capsule, Take 1 capsule (40 mg total) by mouth daily., Disp: 30 capsule, Rfl: 3   ondansetron (ZOFRAN-ODT) 4 MG disintegrating tablet, Take 1 tablet (4 mg total) by mouth every 8 (eight) hours as needed for nausea or vomiting., Disp: 20 tablet, Rfl: 0   pantoprazole (PROTONIX) 40 MG tablet, Take 1 tablet (40 mg total) by mouth 2 (two) times daily., Disp: 60 tablet, Rfl: 1   predniSONE (DELTASONE) 20 MG tablet, 3 tabs po day one, then 2 tabs daily x 4 days, Disp: 11 tablet, Rfl: 0   sertraline (ZOLOFT) 50 MG tablet, Take 1 tablet (50 mg total) by mouth daily., Disp: 30 tablet, Rfl: 1   SUMAtriptan (IMITREX) 25 MG tablet, Take 25 mg by mouth once. May repeat in 2 hours if headache persists or recurs., Disp: , Rfl:    traZODone (DESYREL) 50 MG tablet, Take 0.5-1 tablets (25-50 mg total) by mouth at bedtime as needed for sleep., Disp: 30 tablet, Rfl: 1  Observations/Objective: Patient is well-developed, well-nourished in no acute distress.  Resting comfortably  at home.  Head is normocephalic, atraumatic.  No labored breathing.  Speech is clear and coherent with logical content.  Patient is alert and oriented at baseline.    Assessment and Plan:  1. Acute bacterial bronchitis  - amoxicillin-clavulanate (AUGMENTIN) 875-125 MG tablet; Take 1 tablet by mouth 2 (two) times daily for 7 days.  Dispense: 14 tablet; Refill: 0 -  brompheniramine-pseudoephedrine-DM 30-2-10 MG/5ML syrup; Take 5 mLs by mouth 3 (three) times daily as needed.  Dispense: 120 mL; Refill: 0 - benzonatate (TESSALON) 100 MG capsule; Take 1 capsule (100 mg total) by mouth 3 (three) times daily as needed for cough.  Dispense: 20 capsule; Refill: 0 - albuterol (VENTOLIN HFA) 108 (90 Base) MCG/ACT inhaler; Inhale 1-2 puffs into the lungs every 6 (six) hours as needed for wheezing or shortness of breath.  Dispense: 8 g; Refill: 0  -s&s consistent with bacterial infection secondary a viral infection a week ago.  - Take meds as prescribed - Rest voice - Use a cool mist humidifier especially during the winter months when heat dries out the air. - Use saline nose sprays frequently to help soothe nasal passages if they are drying out. - Stay hydrated by drinking plenty of fluids - Keep thermostat turn down low to prevent drying out which can cause a dry cough. - For any cough or congestion- robitussin DM or Delsym as needed -  For fever or aches or pains- take tylenol or ibuprofen as directed on bottle             * for fevers greater than 101 orally you may alternate ibuprofen and tylenol every 3 hours.  If you do not improve you will need a follow up visit in person                Reviewed side effects, risks and benefits of medication.    Patient acknowledged agreement and understanding of the plan.   Past Medical, Surgical, Social History, Allergies, and Medications have been Reviewed.    Follow Up Instructions: I discussed the assessment and treatment plan with the patient. The patient was provided an opportunity to ask questions and all were answered. The patient agreed with the plan and demonstrated an understanding of the instructions.  A copy of instructions were sent to the patient via MyChart unless otherwise noted below.    The patient was advised to call back or seek an in-person evaluation if the symptoms worsen or if the condition  fails to improve as anticipated.  Time:  I spent 10 minutes with the patient via telehealth technology discussing the above problems/concerns.    Perlie Mayo, NP

## 2022-03-28 NOTE — Patient Instructions (Signed)
Leslie Mcmillan, thank you for joining Perlie Mayo, NP for today's virtual visit.  While this provider is not your primary care provider (PCP), if your PCP is located in our provider database this encounter information will be shared with them immediately following your visit.   [facility] account gives you access to today's visit and all your visits, tests, and labs performed at [facility] " click here if you don't have a [facility] account or go to [email or web address]  Consent: (Patient) Leslie Mcmillan provided verbal consent for this virtual visit at the beginning of the encounter.  Current Medications:  Current Outpatient Medications:    albuterol (VENTOLIN HFA) 108 (90 Base) MCG/ACT inhaler, Inhale 1-2 puffs into the lungs every 6 (six) hours as needed for wheezing or shortness of breath., Disp: 8 g, Rfl: 0   amoxicillin-clavulanate (AUGMENTIN) 875-125 MG tablet, Take 1 tablet by mouth 2 (two) times daily for 7 days., Disp: 14 tablet, Rfl: 0   benzonatate (TESSALON) 100 MG capsule, Take 1 capsule (100 mg total) by mouth 3 (three) times daily as needed for cough., Disp: 20 capsule, Rfl: 0   brompheniramine-pseudoephedrine-DM 30-2-10 MG/5ML syrup, Take 5 mLs by mouth 3 (three) times daily as needed., Disp: 120 mL, Rfl: 0   amitriptyline (ELAVIL) 100 MG tablet, Take 100 mg by mouth at bedtime., Disp: , Rfl:    atorvastatin (LIPITOR) 20 MG tablet, Take 1 tablet (20 mg total) by mouth at bedtime., Disp: 30 tablet, Rfl: 0   colchicine 0.6 MG tablet, Take 1 tablet (0.6 mg total) by mouth 2 (two) times daily., Disp: 20 tablet, Rfl: 0   dicyclomine (BENTYL) 20 MG tablet, Take 1 tablet (20 mg total) by mouth 2 (two) times daily., Disp: 20 tablet, Rfl: 0   ibuprofen (ADVIL) 600 MG tablet, Take 1 tablet (600 mg total) by mouth every 6 (six) hours as needed., Disp: 30 tablet, Rfl: 0   losartan (COZAAR) 100 MG tablet, Take 1 tablet (100 mg total) by mouth  daily., Disp: 30 tablet, Rfl: 1   omeprazole (PRILOSEC) 40 MG capsule, Take 1 capsule (40 mg total) by mouth daily., Disp: 30 capsule, Rfl: 3   ondansetron (ZOFRAN-ODT) 4 MG disintegrating tablet, Take 1 tablet (4 mg total) by mouth every 8 (eight) hours as needed for nausea or vomiting., Disp: 20 tablet, Rfl: 0   pantoprazole (PROTONIX) 40 MG tablet, Take 1 tablet (40 mg total) by mouth 2 (two) times daily., Disp: 60 tablet, Rfl: 1   predniSONE (DELTASONE) 20 MG tablet, 3 tabs po day one, then 2 tabs daily x 4 days, Disp: 11 tablet, Rfl: 0   sertraline (ZOLOFT) 50 MG tablet, Take 1 tablet (50 mg total) by mouth daily., Disp: 30 tablet, Rfl: 1   SUMAtriptan (IMITREX) 25 MG tablet, Take 25 mg by mouth once. May repeat in 2 hours if headache persists or recurs., Disp: , Rfl:    traZODone (DESYREL) 50 MG tablet, Take 0.5-1 tablets (25-50 mg total) by mouth at bedtime as needed for sleep., Disp: 30 tablet, Rfl: 1   Medications ordered in this encounter:  Meds ordered this encounter  Medications   amoxicillin-clavulanate (AUGMENTIN) 875-125 MG tablet    Sig: Take 1 tablet by mouth 2 (two) times daily for 7 days.    Dispense:  14 tablet    Refill:  0    Order Specific Question:   Supervising Provider    Answer:   Chase Picket [id number]  brompheniramine-pseudoephedrine-DM 30-2-10 MG/5ML syrup    Sig: Take 5 mLs by mouth 3 (three) times daily as needed.    Dispense:  120 mL    Refill:  0    Order Specific Question:   Supervising Provider    Answer:   Chase Picket [[id number]]   benzonatate (TESSALON) 100 MG capsule    Sig: Take 1 capsule (100 mg total) by mouth 3 (three) times daily as needed for cough.    Dispense:  20 capsule    Refill:  0    Order Specific Question:   Supervising Provider    Answer:   Bari Mantis   albuterol (VENTOLIN HFA) 108 (90 Base) MCG/ACT inhaler    Sig: Inhale 1-2 puffs into the lungs every 6 (six) hours as needed for wheezing or shortness of  breath.    Dispense:  8 g    Refill:  0    Order Specific Question:   Supervising Provider    Answer:   Chase Picket [id number]     *If you need refills on other medications prior to your next appointment, please contact your pharmacy*  Follow-Up: Call back or seek an in-person evaluation if the symptoms worsen or if the condition fails to improve as anticipated.  Leslie Mcmillan [phone]  Other Instructions   - Take meds as prescribed - Rest voice - Use a cool mist humidifier especially during the winter months when heat dries out the air. - Use saline nose sprays frequently to help soothe nasal passages if they are drying out. - Stay hydrated by drinking plenty of fluids - Keep thermostat turn down low to prevent drying out which can cause a dry cough. - For any cough or congestion- robitussin DM or Delsym as needed - For fever or aches or pains- take tylenol or ibuprofen as directed on bottle             * for fevers greater than 101 orally you may alternate ibuprofen and tylenol every 3 hours.  If you do not improve you will need a follow up visit in person.                 If you have been instructed to have an in-person evaluation today at a local Urgent Care facility, please use the link below. It will take you to a list of all of our available [facility], including address, phone number and hours of operation. Please do not delay care.  [facility]  If you or a family member do not have a primary care provider, use the link below to schedule a visit and establish care. When you choose a [facility] primary care physician or advanced practice provider, you gain a long-term partner in health. Find a Primary Care Provider  Learn more about 's in-office and virtual care options: [facility]

## 2022-07-10 NOTE — Therapy (Signed)
OUTPATIENT PHYSICAL THERAPY CERVICAL EVALUATION   Patient Name: Leslie Mcmillan MRN: [id number] DOB:1970-04-19, 52 y.o., male Today's Date: 07/11/2022  END OF SESSION:  PT End of Session - 07/11/22 1414     Visit Number 1    Number of Visits 13    Date for PT Re-Evaluation 08/22/22    Authorization Type [facility]    Authorization Time Period 04/27/2022 -8/24/204    Authorization - Visit Number 1    Authorization - Number of Visits 15    PT Start Time 1416    PT Stop Time 1459    PT Time Calculation (min) 43 min    Activity Tolerance Patient limited by pain    Behavior During Therapy [facility] for tasks assessed/performed             Past Medical History:  Diagnosis Date   Anxiety    Blood in stool 5--31-2015   Depression    Diverticulitis    Gastroduodenitis    GERD (gastroesophageal reflux disease)    no meds for   Headache    Hypertension    Kidney stones    Past Surgical History:  Procedure Laterality Date   BOWEL RESECTION N/A 08/12/2013   Procedure: SMALL BOWEL ILEAL RESECTION;  Surgeon: Ardeth Sportsman, MD;  Location: [facility];  Service: General;  Laterality: N/A;   COLONOSCOPY N/A 05/09/2013   Procedure: COLONOSCOPY;  Surgeon: Hilarie Fredrickson, MD;  Location: [facility];  Service: Endoscopy;  Laterality: N/A;   ESOPHAGOGASTRODUODENOSCOPY N/A 05/09/2013   Procedure: ESOPHAGOGASTRODUODENOSCOPY (EGD);  Surgeon: Hilarie Fredrickson, MD;  Location: [facility];  Service: Endoscopy;  Laterality: N/A;  possible egd depending on colon results   LAPAROSCOPIC APPENDECTOMY N/A 08/12/2013   Procedure: LAPAROSCOPIC ASSISTED APPENDECTOMY;  Surgeon: Ardeth Sportsman, MD;  Location: [facility];  Service: General;  Laterality: N/A;   TONSILLECTOMY  as child   Patient Active Problem List   Diagnosis Date Noted   Mild episode of recurrent major depressive disorder (HCC) 11/02/2020   GAD (generalized anxiety disorder) 11/02/2020   Fatty liver 11/02/2020   Alcohol abuse 11/02/2020   Cocaine abuse (HCC)  11/02/2020   Obesity (BMI 30-39.9) 03/16/2016   Dog bite of right hand 09/09/2015   GERD (gastroesophageal reflux disease) 09/09/2015   Hyperlipidemia 03/12/2015   Knee pain, bilateral 08/13/2014   Physical exam 07/14/2014   Bilateral carpal tunnel syndrome 07/13/2014   Diarrhea 03/26/2014   Dental abscess 12/22/2013   Allergic rhinitis 11/20/2013   HTN (hypertension) 11/20/2013   Insomnia 11/13/2013   Migraine headache without aura 11/13/2013   Condyloma acuminatum of penis 08/12/2013   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 Has had back issues for some time which pt states has resulted in decline in activity, no longer working. Pt states he then developed neck pain + LUE pain/numbness, cannot recall any specific precipitating factor. Was referred to spine specialist and received imaging, states he was told he had some nerve irritation. Received an injection with mild transient relief, now back to baseline.  Pt endorses LUE pain, numbness, and tingling into fingers when symptoms are worse. Endorses hx of migraines, states they are about the same since onset of neck pain.  Pt also notes that his blood pressure stays fairly  elevated despite medication, tends to run 160s-180s systolic although fluctuates. Endorses some exertional SOB, no chest pain. States he can tell when his BP is elevated, occasionally feels "woozy" and endorses occasional transient visual changes. Vitals checked in clinic today, see assessment below, advised pt to follow up with his PCP after today's session. Other red flag questioning reassuring.  Hand dominance: Right  PERTINENT HISTORY:  Hx cancer (s/p bowel resection, follows with oncology), migraines, HTN, GERD, GAD  PAIN:  Are you having pain: 5-6/10 Location/description: aching  Best-worst over past week: 5-8/10  - aggravating factors: sitting, lying on his side, cervical movement - Easing factors: changing positions, injection, heating pad     PRECAUTIONS: hx cancer, HTN (not well controlled per pt, tends to run 160s-180s systolic)  WEIGHT BEARING RESTRICTIONS: No  FALLS:  Has patient fallen in last 6 months? 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, [employer] representative  PLOF: Independent  PATIENT GOALS: reduce pain  NEXT MD VISIT: TBD  OBJECTIVE:   DIAGNOSTIC FINDINGS:  MRI per paper referral shows cervical spondylosis and foraminal stenosis C5-7  PATIENT SURVEYS:  FOTO 46 current, 59 predicted  COGNITION: Overall cognitive status: Within functional limits for tasks assessed  SENSATION/NEURO: Light touch intact BUE mildly diminished L C4, C7 Finger<>nose testing unremarkable No clonus BIL Negative hoffmann and tromner sign  No ataxia with gait   POSTURE: forward head, rigid, guarded posture   PALPATION: Significant symptom irritability with palpation of L LS, UT, rhomboid, deltoid. No midline tenderness or gross deformity noted   CERVICAL ROM:   Active ROM A/PROM (deg) eval  Flexion 25% *  Extension 25% *  Right lateral flexion 50% pain  Left lateral  flexion 50% pain   Right rotation 32 deg *  Left rotation 36 deg **    (Blank rows = not tested) (Key: WFL = within functional limits not formally assessed, * = concordant pain, s = stiffness/stretching sensation, NT = not tested)   UPPER EXTREMITY ROM:  A/PROM Right eval Left eval  Shoulder flexion    Shoulder abduction    Shoulder internal rotation    Shoulder external rotation    Elbow flexion    Elbow extension    Wrist flexion    Wrist extension     (Blank rows = not tested) (Key: WFL = within functional limits not formally assessed, * = concordant pain, s = stiffness/stretching sensation, NT = not tested)  Comments: deferred given symptom irritability  UPPER EXTREMITY MMT:  MMT Right eval Left eval  Shoulder flexion    Shoulder extension    Shoulder abduction    Shoulder extension    Shoulder internal rotation    Shoulder external rotation    Elbow  flexion    Elbow extension    Grip strength    (Blank rows = not tested)  (Key: WFL = within functional limits not formally assessed, * = concordant pain, s = stiffness/stretching sensation, NT = not tested)  Comments: deferred given symptom irritability  CERVICAL SPECIAL TESTS:  Deferred given symptom irritability   FUNCTIONAL TESTS:  Limited/painful overhead reach LUE   Vitals: HR 97-100, SpO2 98% RA, BP 146/106 No adverse symptoms endorsed during evaluation  TODAY'S TREATMENT:                                                                                                                              [facility] Adult PT Treatment:                                                DATE: 07/11/22 Deferred on eval  PATIENT EDUCATION:  Education details: Pt education on PT impairments, prognosis, and POC. Informed consent. Rationale for interventions. Monitoring symptoms and appropriate response. Follow up with PCP (see assessment) Person educated: Patient Education method: Explanation, Demonstration, Tactile cues,  Verbal cues, and Handouts Education comprehension: verbalized understanding, returned demonstration, verbal cues required, tactile cues required, and needs further education    HOME EXERCISE PROGRAM: TBD  ASSESSMENT:  CLINICAL IMPRESSION: Patient is a pleasant 52 y.o. gentleman who was seen today for physical therapy evaluation and treatment for cervical radiculopathy that has been going on for several months. Pt endorses reduction in activity and significant difficulty with typical daily mobility/activity due to pain. Today's examination is limited by symptom irritability, although he demonstrates global limitations in cervical ROM and concordant TTP of L periscapular/cervical musculature. Cervical mobility and palpation also elicits LUE N/T. Deferred further examination/HEP given symptom irritability. Recommend skilled PT to address relevant deficits to maximize functional independence and reduce pain levels. During red flag questioning pt does endorse consistently high BP despite medication and symptoms (see subjective above). No symptoms in clinic today, vitals as above. Education on pt on monitoring BP at home, reaching out to PCP to discuss his consistently high BP and the symptoms described in subjective portion after today's session, he verbalizes agreement/understanding. No adverse events. Pt departs today's session in no acute distress, all voiced questions/concerns addressed appropriately from PT perspective.    OBJECTIVE IMPAIRMENTS: decreased activity tolerance, decreased endurance, decreased mobility, decreased ROM, decreased strength, hypomobility, increased muscle spasms, impaired UE functional use, postural dysfunction, and pain.   ACTIVITY LIMITATIONS: carrying, lifting, bending, sitting, standing, sleeping, reach over head, and hygiene/grooming  PARTICIPATION LIMITATIONS: meal prep, cleaning, laundry, driving, community activity, and occupation  PERSONAL FACTORS: Time since  onset of injury/illness/exacerbation and 3+ comorbidities: HTN, migraines  are also affecting patient's functional outcome.   REHAB POTENTIAL: Fair given severity of symptoms and comorbidities  CLINICAL DECISION MAKING: Evolving/moderate complexity  EVALUATION COMPLEXITY: Moderate   GOALS: Goals reviewed with patient? No  SHORT TERM GOALS: Target date: 08/01/2022 Pt will demonstrate appropriate understanding and performance of initially prescribed HEP in order to facilitate improved independence with management of symptoms.  Baseline: HEP TBD Goal status: INITIAL   2. Pt will score greater than or equal to 51 on FOTO in order to demonstrate improved perception of function due to symptoms.  Baseline: 46  Goal status: INITIAL    LONG TERM GOALS: Target date: 08/22/2022 Pt will score 59 on FOTO in order to demonstrate improved perception of function due to symptoms. Baseline: 46 Goal status: INITIAL  2. Pt will demonstrate at least 50 degrees of active cervical rotation ROM in order to demonstrate improved environmental awareness and safety with driving.  Baseline: see ROM chart above Goal status: INITIAL  3. Pt will report at least 50% decrease in overall pain levels in past week in order to facilitate improved tolerance to basic ADLs/mobility.   Baseline: 4-8/10  Goal status: INITIAL    4. Pt will demonstrate appropriate performance of final prescribed HEP in order to facilitate improved self-management of symptoms post-discharge.   Baseline: HEP TBD  Goal status: INITIAL     PLAN:  PT FREQUENCY: 2x/week  PT DURATION: 6 weeks  PLANNED INTERVENTIONS: Therapeutic exercises, Therapeutic activity, Neuromuscular re-education, Balance training, Gait training, Patient/Family education, Self Care, Joint mobilization, Stair training, Vestibular training, Aquatic Therapy, Dry Needling, Spinal mobilization, Cryotherapy, Moist heat, Taping, Manual therapy, and Re-evaluation  PLAN FOR  NEXT SESSION: establish HEP, anticipate gentle cervical/periscapular mobility. Pain modulation strategies as indicated   Ashley Murrain PT, DPT 07/11/2022 5:25 PM

## 2022-07-11 ENCOUNTER — Other Ambulatory Visit: Payer: Self-pay

## 2022-07-11 ENCOUNTER — Ambulatory Visit: Payer: No Typology Code available for payment source | Attending: Neurosurgery | Admitting: Physical Therapy

## 2022-07-11 ENCOUNTER — Encounter: Payer: Self-pay | Admitting: Physical Therapy

## 2022-07-11 DIAGNOSIS — R293 Abnormal posture: Secondary | ICD-10-CM | POA: Diagnosis present

## 2022-07-11 DIAGNOSIS — M542 Cervicalgia: Secondary | ICD-10-CM

## 2022-07-11 DIAGNOSIS — M25512 Pain in left shoulder: Secondary | ICD-10-CM

## 2022-07-25 NOTE — Therapy (Signed)
OUTPATIENT PHYSICAL THERAPY TREATMENT NOTE   Patient Name: Leslie Mcmillan MRN: [id number] DOB:17-Jan-1971, 52 y.o., male Today's Date: 07/26/2022  END OF SESSION:  PT End of Session - 07/26/22 1551     Visit Number 2    Number of Visits 13    Date for PT Re-Evaluation 08/22/22    Authorization Type [facility]    Authorization Time Period 04/27/2022 -8/24/204    Authorization - Visit Number 2    Authorization - Number of Visits 15    PT Start Time 1552   late check in   PT Stop Time 1627    PT Time Calculation (min) 35 min    Activity Tolerance Patient limited by pain    Behavior During Therapy [facility] for tasks assessed/performed              Past Medical History:  Diagnosis Date   Anxiety    Blood in stool 5--31-2015   Depression    Diverticulitis    Gastroduodenitis    GERD (gastroesophageal reflux disease)    no meds for   Headache    Hypertension    Kidney stones    Past Surgical History:  Procedure Laterality Date   BOWEL RESECTION N/A 08/12/2013   Procedure: SMALL BOWEL ILEAL RESECTION;  Surgeon: Ardeth Sportsman, MD;  Location: [facility];  Service: General;  Laterality: N/A;   COLONOSCOPY N/A 05/09/2013   Procedure: COLONOSCOPY;  Surgeon: Hilarie Fredrickson, MD;  Location: [facility];  Service: Endoscopy;  Laterality: N/A;   ESOPHAGOGASTRODUODENOSCOPY N/A 05/09/2013   Procedure: ESOPHAGOGASTRODUODENOSCOPY (EGD);  Surgeon: Hilarie Fredrickson, MD;  Location: [facility];  Service: Endoscopy;  Laterality: N/A;  possible egd depending on colon results   LAPAROSCOPIC APPENDECTOMY N/A 08/12/2013   Procedure: LAPAROSCOPIC ASSISTED APPENDECTOMY;  Surgeon: Ardeth Sportsman, MD;  Location: [facility];  Service: General;  Laterality: N/A;   TONSILLECTOMY  as child   Patient Active Problem List   Diagnosis Date Noted   Mild episode of recurrent major depressive disorder (HCC) 11/02/2020   GAD (generalized anxiety disorder) 11/02/2020   Fatty liver 11/02/2020   Alcohol abuse 11/02/2020   Cocaine  abuse (HCC) 11/02/2020   Obesity (BMI 30-39.9) 03/16/2016   Dog bite of right hand 09/09/2015   GERD (gastroesophageal reflux disease) 09/09/2015   Hyperlipidemia 03/12/2015   Knee pain, bilateral 08/13/2014   Physical exam 07/14/2014   Bilateral carpal tunnel syndrome 07/13/2014   Diarrhea 03/26/2014   Dental abscess 12/22/2013   Allergic rhinitis 11/20/2013   HTN (hypertension) 11/20/2013   Insomnia 11/13/2013   Migraine headache without aura 11/13/2013   Condyloma acuminatum of penis 08/12/2013   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 Has had back issues for some time which pt states has resulted in decline in activity, no longer working. Pt states he then developed neck pain + LUE pain/numbness, cannot recall any specific precipitating factor. Was referred to spine specialist and received imaging, states he was told he had some nerve irritation. Received an injection with mild transient relief, now back to baseline.  Pt endorses LUE pain, numbness, and tingling into fingers when symptoms are worse. Endorses hx of migraines, states they are about the same since onset of neck pain.  Pt also notes that his blood pressure stays fairly  elevated despite medication, tends to run 160s-180s systolic although fluctuates. Endorses some exertional SOB, no chest pain. States he can tell when his BP is elevated, occasionally feels "woozy" and endorses occasional transient visual changes. Vitals checked in clinic today, see assessment below, advised pt to follow up with his PCP after today's session. Other red flag questioning reassuring.  SUBJECTIVE STATEMENT: 07/26/2022 Pt states he feels okay today but continues to have about the same amount of pain overall. Notes that he has most of his pain when leaning forward playing video games. States he spoke with his doctor about his BP/symptoms, visit scheduled for later this week. No other new updates Hand dominance: Right  PERTINENT HISTORY:  Hx cancer (s/p bowel resection, follows with oncology), migraines, HTN, GERD, GAD  PAIN:  Are you having pain: 2/10 Location/description: aching   Per eval -  Best-worst over past week: 5-8/10  - aggravating factors: sitting, lying on his side, cervical movement - Easing factors: changing positions, injection, heating pad     PRECAUTIONS: hx cancer, HTN (not well controlled per pt, tends to run 160s-180s systolic)  WEIGHT BEARING RESTRICTIONS: No  FALLS:  Has patient fallen in last 6 months? 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, Holiday representative  PLOF: Independent  PATIENT GOALS: reduce pain  NEXT MD VISIT: TBD  OBJECTIVE:   DIAGNOSTIC FINDINGS:  MRI per paper referral shows cervical spondylosis and foraminal stenosis C5-7  PATIENT SURVEYS:  FOTO 46 current, 59 predicted  COGNITION: Overall cognitive status: Within functional limits for tasks assessed  SENSATION/NEURO: Light touch intact BUE mildly diminished L C4, C7 Finger<>nose testing unremarkable No clonus BIL Negative hoffmann and tromner sign  No ataxia with  gait   POSTURE: forward head, rigid, guarded posture   PALPATION: Significant symptom irritability with palpation of L LS, UT, rhomboid, deltoid. No midline tenderness or gross deformity noted   CERVICAL ROM:   Active ROM A/PROM (deg) eval  Flexion 25% *  Extension 25% *  Right lateral flexion 50% pain  Left lateral flexion 50% pain   Right rotation 32 deg *  Left rotation 36 deg **    (Blank rows = not tested) (Key: WFL = within functional limits not formally assessed, * = concordant pain, s = stiffness/stretching sensation, NT = not tested)   UPPER EXTREMITY ROM:  A/PROM Right eval Left eval  Shoulder flexion    Shoulder abduction    Shoulder internal rotation    Shoulder external rotation    Elbow flexion    Elbow extension    Wrist flexion    Wrist extension     (Blank rows = not tested) (Key: WFL = within functional limits not formally assessed, * = concordant  pain, s = stiffness/stretching sensation, NT = not tested)  Comments: deferred given symptom irritability  UPPER EXTREMITY MMT:  MMT Right eval Left eval  Shoulder flexion    Shoulder extension    Shoulder abduction    Shoulder extension    Shoulder internal rotation    Shoulder external rotation    Elbow flexion    Elbow extension    Grip strength    (Blank rows = not tested)  (Key: WFL = within functional limits not formally assessed, * = concordant pain, s = stiffness/stretching sensation, NT = not tested)  Comments: deferred given symptom irritability  CERVICAL SPECIAL TESTS:  Deferred given symptom irritability   FUNCTIONAL TESTS:  Limited/painful overhead reach LUE   Vitals: HR 97-100, SpO2 98% RA, BP 146/106 No adverse symptoms endorsed during evaluation  TODAY'S TREATMENT:                                                                                                                              [facility] Adult PT Treatment:                                                DATE:  07/26/22 Vitals: HR 95-98 SpO2 100% BP 161-165/105-110 (monitored throughout) Therapeutic Exercise: Seated scapular retraction 2x10 cues for breath control  Posterior shoulder rolls 2x8 cues for pacing and breath control  Median nerve glides BIL 2x10 cues for appropriate ROM, extensive education on rationale and appropriate performance for symptom modification HEP update + education   PATIENT EDUCATION:  Education details: rationale for interventions, pacing of activities, monitoring symptoms, communication with provider Person educated: Patient Education method: Explanation, Demonstration, Tactile cues, Verbal cues, and Handouts Education comprehension: verbalized understanding, returned demonstration, verbal cues required, tactile cues required, and needs further education    HOME EXERCISE PROGRAM: Access Code: [email or web address] URL: [email or web address] Date: 07/26/2022 Prepared by: Fransisco Hertz  Exercises - Median Nerve Flossing  - 1 x daily - 7 x weekly - 3 sets - 5 reps - Seated Scapular Retraction  - 1 x daily - 7 x weekly - 3 sets - 8 reps  ASSESSMENT:  CLINICAL IMPRESSION: 07/26/2022 Pt arrives w/ 2/10 pain on NPS, denies any overt change in symptoms since initial eval, states he has spoken with his PCP about his BP and symptoms described at eval, will be seeing them this week. Vitals monitored throughout session today, pt remains hypertensive although stable, and states these values are actually better than what they often are at home. Continue to encourage follow up/communication w/ provider, no adverse symptoms during today's session. Given hypertension and symptom irritability, pt requires extensive rest breaks with activity although tolerance is improved compared to initial evaluation. Notes improved symptoms with scapular retractions, mild change in UE tingling w/ median nerve glides. Extensive education on monitoring symptoms,  appropriate exercise modification. No  adverse events, pt denies any significant change in symptoms on departure. Recommend continuing along current POC in order to address relevant deficits and improve functional tolerance. Pt departs today's session in no acute distress, all voiced questions/concerns addressed appropriately from PT perspective.    Per eval - Patient is a pleasant 52 y.o. gentleman who was seen today for physical therapy evaluation and treatment for cervical radiculopathy that has been going on for several months. Pt endorses reduction in activity and significant difficulty with typical daily mobility/activity due to pain. Today's examination is limited by symptom irritability, although he demonstrates global limitations in cervical ROM and concordant TTP of L periscapular/cervical musculature. Cervical mobility and palpation also elicits LUE N/T. Deferred further examination/HEP given symptom irritability. Recommend skilled PT to address relevant deficits to maximize functional independence and reduce pain levels. During red flag questioning pt does endorse consistently high BP despite medication and symptoms (see subjective above). No symptoms in clinic today, vitals as above. Education on pt on monitoring BP at home, reaching out to PCP to discuss his consistently high BP and the symptoms described in subjective portion after today's session, he verbalizes agreement/understanding. No adverse events. Pt departs today's session in no acute distress, all voiced questions/concerns addressed appropriately from PT perspective.    OBJECTIVE IMPAIRMENTS: decreased activity tolerance, decreased endurance, decreased mobility, decreased ROM, decreased strength, hypomobility, increased muscle spasms, impaired UE functional use, postural dysfunction, and pain.   ACTIVITY LIMITATIONS: carrying, lifting, bending, sitting, standing, sleeping, reach over head, and hygiene/grooming  PARTICIPATION LIMITATIONS: meal prep, cleaning, laundry,  driving, community activity, and occupation  PERSONAL FACTORS: Time since onset of injury/illness/exacerbation and 3+ comorbidities: HTN, migraines  are also affecting patient's functional outcome.   REHAB POTENTIAL: Fair given severity of symptoms and comorbidities  CLINICAL DECISION MAKING: Evolving/moderate complexity  EVALUATION COMPLEXITY: Moderate   GOALS: Goals reviewed with patient? No  SHORT TERM GOALS: Target date: 08/01/2022 Pt will demonstrate appropriate understanding and performance of initially prescribed HEP in order to facilitate improved independence with management of symptoms.  Baseline: HEP TBD Goal status: INITIAL   2. Pt will score greater than or equal to 51 on FOTO in order to demonstrate improved perception of function due to symptoms.  Baseline: 46  Goal status: INITIAL    LONG TERM GOALS: Target date: 08/22/2022 Pt will score 59 on FOTO in order to demonstrate improved perception of function due to symptoms. Baseline: 46 Goal status: INITIAL  2. Pt will demonstrate at least 50 degrees of active cervical rotation ROM in order to demonstrate improved environmental awareness and safety with driving.  Baseline: see ROM chart above Goal status: INITIAL  3. Pt will report at least 50% decrease in overall pain levels in past week in order to facilitate improved tolerance to basic ADLs/mobility.   Baseline: 4-8/10  Goal status: INITIAL    4. Pt will demonstrate appropriate performance of final prescribed HEP in order to facilitate improved self-management of symptoms post-discharge.   Baseline: HEP TBD  Goal status: INITIAL     PLAN:  PT FREQUENCY: 2x/week  PT DURATION: 6 weeks  PLANNED INTERVENTIONS: Therapeutic exercises, Therapeutic activity, Neuromuscular re-education, Balance training, Gait training, Patient/Family education, Self Care, Joint mobilization, Stair training, Vestibular training, Aquatic Therapy, Dry Needling, Spinal mobilization,  Cryotherapy, Moist heat, Taping, Manual therapy, and Re-evaluation  PLAN FOR NEXT SESSION: review/update HEP PRN. Gentle periscapular/cervical mobility, nerve glides as appropriate. Pain modulation strategies as indicated. Monitor vitals  Ashley Murrain PT, DPT 07/26/2022 5:35 PM

## 2022-07-26 ENCOUNTER — Encounter: Payer: Self-pay | Admitting: Physical Therapy

## 2022-07-26 ENCOUNTER — Ambulatory Visit: Payer: No Typology Code available for payment source | Attending: Neurosurgery | Admitting: Physical Therapy

## 2022-07-26 DIAGNOSIS — R293 Abnormal posture: Secondary | ICD-10-CM

## 2022-07-26 DIAGNOSIS — M25512 Pain in left shoulder: Secondary | ICD-10-CM | POA: Diagnosis present

## 2022-07-26 DIAGNOSIS — M542 Cervicalgia: Secondary | ICD-10-CM | POA: Diagnosis present

## 2022-07-28 ENCOUNTER — Ambulatory Visit: Payer: No Typology Code available for payment source | Admitting: Physical Therapy

## 2022-08-01 ENCOUNTER — Ambulatory Visit: Payer: No Typology Code available for payment source | Admitting: Physical Therapy

## 2022-08-03 ENCOUNTER — Encounter: Payer: Self-pay | Admitting: Physical Therapy

## 2022-08-03 ENCOUNTER — Ambulatory Visit: Payer: No Typology Code available for payment source | Admitting: Physical Therapy

## 2022-08-03 DIAGNOSIS — R293 Abnormal posture: Secondary | ICD-10-CM

## 2022-08-03 DIAGNOSIS — M542 Cervicalgia: Secondary | ICD-10-CM

## 2022-08-03 DIAGNOSIS — M25512 Pain in left shoulder: Secondary | ICD-10-CM

## 2022-08-03 NOTE — Therapy (Signed)
OUTPATIENT PHYSICAL THERAPY TREATMENT NOTE   Patient Name: Leslie Mcmillan MRN: [id number] DOB:October 22, 1970, 52 y.o., male Today's Date: 08/03/2022  END OF SESSION:  PT End of Session - 08/03/22 1149     Visit Number 3    Number of Visits 13    Date for PT Re-Evaluation 08/22/22    Authorization Type [facility]    Authorization Time Period 04/27/2022 -8/24/204    Authorization - Visit Number 3    Authorization - Number of Visits 15    PT Start Time 1150   late check in   PT Stop Time 1230    PT Time Calculation (min) 40 min    Activity Tolerance Patient limited by pain    Behavior During Therapy [facility] for tasks assessed/performed               Past Medical History:  Diagnosis Date   Anxiety    Blood in stool 5--31-2015   Depression    Diverticulitis    Gastroduodenitis    GERD (gastroesophageal reflux disease)    no meds for   Headache    Hypertension    Kidney stones    Past Surgical History:  Procedure Laterality Date   BOWEL RESECTION N/A 08/12/2013   Procedure: SMALL BOWEL ILEAL RESECTION;  Surgeon: Ardeth Sportsman, MD;  Location: [facility];  Service: General;  Laterality: N/A;   COLONOSCOPY N/A 05/09/2013   Procedure: COLONOSCOPY;  Surgeon: Hilarie Fredrickson, MD;  Location: [facility];  Service: Endoscopy;  Laterality: N/A;   ESOPHAGOGASTRODUODENOSCOPY N/A 05/09/2013   Procedure: ESOPHAGOGASTRODUODENOSCOPY (EGD);  Surgeon: Hilarie Fredrickson, MD;  Location: [facility];  Service: Endoscopy;  Laterality: N/A;  possible egd depending on colon results   LAPAROSCOPIC APPENDECTOMY N/A 08/12/2013   Procedure: LAPAROSCOPIC ASSISTED APPENDECTOMY;  Surgeon: Ardeth Sportsman, MD;  Location: [facility];  Service: General;  Laterality: N/A;   TONSILLECTOMY  as child   Patient Active Problem List   Diagnosis Date Noted   Mild episode of recurrent major depressive disorder (HCC) 11/02/2020   GAD (generalized anxiety disorder) 11/02/2020   Fatty liver 11/02/2020   Alcohol abuse 11/02/2020   Cocaine  abuse (HCC) 11/02/2020   Obesity (BMI 30-39.9) 03/16/2016   Dog bite of right hand 09/09/2015   GERD (gastroesophageal reflux disease) 09/09/2015   Hyperlipidemia 03/12/2015   Knee pain, bilateral 08/13/2014   Physical exam 07/14/2014   Bilateral carpal tunnel syndrome 07/13/2014   Diarrhea 03/26/2014   Dental abscess 12/22/2013   Allergic rhinitis 11/20/2013   HTN (hypertension) 11/20/2013   Insomnia 11/13/2013   Migraine headache without aura 11/13/2013   Condyloma acuminatum of penis 08/12/2013   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 Has had back issues for some time which pt states has resulted in decline in activity, no longer working. Pt states he then developed neck pain + LUE pain/numbness, cannot recall any specific precipitating factor. Was referred to spine specialist and received imaging, states he was told he had some nerve irritation. Received an injection with mild transient relief, now back to baseline.  Pt endorses LUE pain, numbness, and tingling into fingers when symptoms are worse. Endorses hx of migraines, states they are about the same since onset of neck pain.  Pt also notes that his blood pressure stays fairly  elevated despite medication, tends to run 160s-180s systolic although fluctuates. Endorses some exertional SOB, no chest pain. States he can tell when his BP is elevated, occasionally feels "woozy" and endorses occasional transient visual changes. Vitals checked in clinic today, see assessment below, advised pt to follow up with his PCP after today's session. Other red flag questioning reassuring.   SUBJECTIVE STATEMENT: 08/03/2022 Pt endorses 2/10 pain at present. Notes exercises have been helping his pain while playing video games. Followed up with his PCP re: BP and symptoms described on eval, states they are changing his meds and was encouraged to participate in relaxation techniques. No other new updates Hand dominance: Right  PERTINENT HISTORY:  Hx cancer (s/p bowel resection, follows with oncology), migraines, HTN, GERD, GAD  PAIN:  Are you having pain: 2/10 Location/description: aching   Per eval -  Best-worst over past week: 5-8/10  - aggravating factors: sitting, lying on his side, cervical movement - Easing factors: changing positions, injection, heating pad     PRECAUTIONS: hx cancer, HTN (not well controlled per pt, tends to run 160s-180s systolic)  WEIGHT BEARING RESTRICTIONS: No  FALLS:  Has patient fallen in last 6 months? 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, Holiday representative  PLOF: Independent  PATIENT GOALS: reduce pain  NEXT MD VISIT: TBD  OBJECTIVE: (objective measures completed at initial evaluation unless otherwise dated)   DIAGNOSTIC FINDINGS:  MRI per paper referral shows cervical spondylosis and foraminal stenosis C5-7  PATIENT SURVEYS:  FOTO 46 current, 59 predicted  COGNITION: Overall cognitive status: Within functional limits for tasks assessed  SENSATION/NEURO: Light touch intact BUE mildly diminished L C4, C7 Finger<>nose testing  unremarkable No clonus BIL Negative hoffmann and tromner sign  No ataxia with gait   POSTURE: forward head, rigid, guarded posture   PALPATION: Significant symptom irritability with palpation of L LS, UT, rhomboid, deltoid. No midline tenderness or gross deformity noted   CERVICAL ROM:   Active ROM A/PROM (deg) eval  Flexion 25% *  Extension 25% *  Right lateral flexion 50% pain  Left lateral flexion 50% pain   Right rotation 32 deg *  Left rotation 36 deg **    (Blank rows = not tested) (Key: WFL = within functional limits not formally assessed, * = concordant pain, s = stiffness/stretching sensation, NT = not tested)   UPPER EXTREMITY ROM:  A/PROM Right eval Left eval  Shoulder flexion    Shoulder abduction    Shoulder internal rotation    Shoulder external rotation    Elbow flexion    Elbow extension    Wrist flexion    Wrist extension     (Blank rows = not tested) (Key: WFL = within functional  limits not formally assessed, * = concordant pain, s = stiffness/stretching sensation, NT = not tested)  Comments: deferred given symptom irritability  UPPER EXTREMITY MMT:  MMT Right eval Left eval  Shoulder flexion    Shoulder extension    Shoulder abduction    Shoulder extension    Shoulder internal rotation    Shoulder external rotation    Elbow flexion    Elbow extension    Grip strength    (Blank rows = not tested)  (Key: WFL = within functional limits not formally assessed, * = concordant pain, s = stiffness/stretching sensation, NT = not tested)  Comments: deferred given symptom irritability  CERVICAL SPECIAL TESTS:  Deferred given symptom irritability   FUNCTIONAL TESTS:  Limited/painful overhead reach LUE   Vitals: HR 97-100, SpO2 98% RA, BP 146/106 No adverse symptoms endorsed during evaluation  TODAY'S TREATMENT:                                                                                                                              [facility]  Adult PT Treatment:                                                DATE: 08/03/22 Vitals 173/114 pre session HR 95-97bpm SpO2 97% Mid session BP BP 165/120 HR 95-98bpm SpO2 98% Post session BP 169/114, HR/SpO2 stable Neuromuscular re-ed: Significant time spent w/ pain neuroscience education as it relates to life stressors, relevant anatomy/physiology, symptom behavior, relaxation strategies Diaphragmatic paced breathing 3 x 5  breaths with 2 min rest between  Therapeutic Activity: Monitoring vitals, significant time spent discussing pt symptoms, communication w/ provider, relevant anatomy/physiology   [facility] Adult PT Treatment:                                                DATE: 07/26/22 Vitals: HR 95-98 SpO2 100% BP 161-165/105-110 (monitored throughout) Therapeutic Exercise: Seated scapular retraction 2x10 cues for breath control  Posterior shoulder rolls 2x8 cues for pacing and breath control  Median nerve glides BIL 2x10 cues for appropriate ROM, extensive education on rationale and appropriate performance for symptom modification HEP update + education   PATIENT EDUCATION:  Education details: rationale for interventions, pain neuroscience education, monitoring vitals/symptoms and follow up with provider Person educated: Patient Education method: Explanation, Demonstration, Tactile cues, Verbal cues, and Handouts Education comprehension: verbalized understanding, returned demonstration, verbal cues required, tactile cues required, and needs further education    HOME EXERCISE PROGRAM: Access Code: [email or web address] URL: [email or web address] Date: 07/26/2022 Prepared by: Fransisco Hertz  Exercises - Median Nerve Flossing  - 1 x daily - 7 x weekly - 3 sets - 5 reps - Seated  Scapular Retraction  - 1 x daily - 7 x weekly - 3 sets - 8 reps  ASSESSMENT:  CLINICAL IMPRESSION: 08/03/2022 Pt arrives w/ 2/10 pain on NPS, states he is actually feeling pretty good today pain-wise, and  that he followed up with PCP who will be changing his medication regimen. With pre-session vitals, pt is noted to be hypertensive (see above, systolic comparable to pt typical readings at home but diastolic more relatively elevated, states it tends to run 90s-110s at home), remains hypertensive throughout. Upon discussion, pt notes that he missed his BP med dose this morning, drank an energy drink earlier, and is also having increased stressors at work. Given this, deferred exercise and instead spent time with relaxation techniques, diaphragmatic breathing for muscular relaxation, and significant time with education/discussion re: activity tolerance, symptom behavior, and pain neuroscience education. Pt denies any adverse symptoms throughout, vitals stable on departure. Pt states he plans to go home and take his BP meds. Encouraged to monitor BP at home and if remains elevated contact his provider, and to present to UC/ED if remains elevated outside his normal range or if adverse symptoms occur. Pt verbalizes agreement/understanding with plan, departs in no acute distress.   Per eval - Patient is a pleasant 52 y.o. gentleman who was seen today for physical therapy evaluation and treatment for cervical radiculopathy that has been going on for several months. Pt endorses reduction in activity and significant difficulty with typical daily mobility/activity due to pain. Today's examination is limited by symptom irritability, although he demonstrates global limitations in cervical ROM and concordant TTP of L periscapular/cervical musculature. Cervical mobility and palpation also elicits LUE N/T. Deferred further examination/HEP given symptom irritability. Recommend skilled PT to address relevant deficits to maximize functional independence and reduce pain levels. During red flag questioning pt does endorse consistently high BP despite medication and symptoms (see subjective above). No symptoms in clinic today, vitals  as above. Education on pt on monitoring BP at home, reaching out to PCP to discuss his consistently high BP and the symptoms described in subjective portion after today's session, he verbalizes agreement/understanding. No adverse events. Pt departs today's session in no acute distress, all voiced questions/concerns addressed appropriately from PT perspective.    OBJECTIVE IMPAIRMENTS: decreased activity tolerance, decreased endurance, decreased mobility, decreased ROM, decreased strength, hypomobility, increased muscle spasms, impaired UE functional use, postural dysfunction, and pain.   ACTIVITY LIMITATIONS: carrying, lifting, bending, sitting, standing, sleeping, reach over head, and hygiene/grooming  PARTICIPATION LIMITATIONS: meal prep, cleaning, laundry, driving, community activity, and occupation  PERSONAL FACTORS: Time since onset of injury/illness/exacerbation and 3+ comorbidities: HTN, migraines  are also affecting patient's functional outcome.   REHAB POTENTIAL: Fair given severity of symptoms and comorbidities  CLINICAL DECISION MAKING: Evolving/moderate complexity  EVALUATION COMPLEXITY: Moderate   GOALS: Goals reviewed with patient? No  SHORT TERM GOALS: Target date: 08/01/2022 Pt will demonstrate appropriate understanding and performance of initially prescribed HEP in order to facilitate improved independence with management of symptoms.  Baseline: HEP TBD Goal status: INITIAL   2. Pt will score greater than or equal to 51 on FOTO in order to demonstrate improved perception of function due to symptoms.  Baseline: 46  Goal status: INITIAL    LONG TERM GOALS: Target date: 08/22/2022 Pt will score 59 on FOTO in order to demonstrate improved perception of function due to symptoms. Baseline: 46 Goal status: INITIAL  2. Pt will demonstrate at least 50 degrees of active cervical rotation ROM in order  to demonstrate improved environmental awareness and safety with driving.   Baseline: see ROM chart above Goal status: INITIAL  3. Pt will report at least 50% decrease in overall pain levels in past week in order to facilitate improved tolerance to basic ADLs/mobility.   Baseline: 4-8/10  Goal status: INITIAL    4. Pt will demonstrate appropriate performance of final prescribed HEP in order to facilitate improved self-management of symptoms post-discharge.   Baseline: HEP TBD  Goal status: INITIAL     PLAN:  PT FREQUENCY: 2x/week  PT DURATION: 6 weeks  PLANNED INTERVENTIONS: Therapeutic exercises, Therapeutic activity, Neuromuscular re-education, Balance training, Gait training, Patient/Family education, Self Care, Joint mobilization, Stair training, Vestibular training, Aquatic Therapy, Dry Needling, Spinal mobilization, Cryotherapy, Moist heat, Taping, Manual therapy, and Re-evaluation  PLAN FOR NEXT SESSION: review/update HEP PRN. Gentle periscapular/cervical mobility, nerve glides as appropriate. Pain modulation strategies as indicated. Monitor vitals     Ashley Murrain PT, DPT 08/03/2022 2:43 PM

## 2022-08-08 ENCOUNTER — Ambulatory Visit: Payer: No Typology Code available for payment source | Attending: Neurosurgery | Admitting: Physical Therapy

## 2022-08-08 ENCOUNTER — Encounter: Payer: Self-pay | Admitting: Physical Therapy

## 2022-08-08 DIAGNOSIS — M25512 Pain in left shoulder: Secondary | ICD-10-CM | POA: Insufficient documentation

## 2022-08-08 DIAGNOSIS — M542 Cervicalgia: Secondary | ICD-10-CM | POA: Diagnosis not present

## 2022-08-08 DIAGNOSIS — R293 Abnormal posture: Secondary | ICD-10-CM | POA: Insufficient documentation

## 2022-08-08 NOTE — Therapy (Signed)
OUTPATIENT PHYSICAL THERAPY TREATMENT NOTE   Patient Name: Leslie Mcmillan MRN: [id number] DOB:1970/04/19, 52 y.o., male Today's Date: 08/08/2022  END OF SESSION:  PT End of Session - 08/08/22 1505     Visit Number 4    Number of Visits 13    Date for PT Re-Evaluation 08/22/22    Authorization Type [facility]    Authorization Time Period 04/27/2022 -8/24/204    Authorization - Visit Number 4    Authorization - Number of Visits 15    PT Start Time 1506   late check in   PT Stop Time 1539    PT Time Calculation (min) 33 min    Activity Tolerance Patient tolerated treatment well    Behavior During Therapy [facility] for tasks assessed/performed                Past Medical History:  Diagnosis Date   Anxiety    Blood in stool 5--31-2015   Depression    Diverticulitis    Gastroduodenitis    GERD (gastroesophageal reflux disease)    no meds for   Headache    Hypertension    Kidney stones    Past Surgical History:  Procedure Laterality Date   BOWEL RESECTION N/A 08/12/2013   Procedure: SMALL BOWEL ILEAL RESECTION;  Surgeon: Ardeth Sportsman, MD;  Location: [facility];  Service: General;  Laterality: N/A;   COLONOSCOPY N/A 05/09/2013   Procedure: COLONOSCOPY;  Surgeon: Hilarie Fredrickson, MD;  Location: [facility];  Service: Endoscopy;  Laterality: N/A;   ESOPHAGOGASTRODUODENOSCOPY N/A 05/09/2013   Procedure: ESOPHAGOGASTRODUODENOSCOPY (EGD);  Surgeon: Hilarie Fredrickson, MD;  Location: [facility];  Service: Endoscopy;  Laterality: N/A;  possible egd depending on colon results   LAPAROSCOPIC APPENDECTOMY N/A 08/12/2013   Procedure: LAPAROSCOPIC ASSISTED APPENDECTOMY;  Surgeon: Ardeth Sportsman, MD;  Location: [facility];  Service: General;  Laterality: N/A;   TONSILLECTOMY  as child   Patient Active Problem List   Diagnosis Date Noted   Mild episode of recurrent major depressive disorder (HCC) 11/02/2020   GAD (generalized anxiety disorder) 11/02/2020   Fatty liver 11/02/2020   Alcohol abuse 11/02/2020    Cocaine abuse (HCC) 11/02/2020   Obesity (BMI 30-39.9) 03/16/2016   Dog bite of right hand 09/09/2015   GERD (gastroesophageal reflux disease) 09/09/2015   Hyperlipidemia 03/12/2015   Knee pain, bilateral 08/13/2014   Physical exam 07/14/2014   Bilateral carpal tunnel syndrome 07/13/2014   Diarrhea 03/26/2014   Dental abscess 12/22/2013   Allergic rhinitis 11/20/2013   HTN (hypertension) 11/20/2013   Insomnia 11/13/2013   Migraine headache without aura 11/13/2013   Condyloma acuminatum of penis 08/12/2013   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 Has had back issues for some time which pt states has resulted in decline in activity, no longer working. Pt states he then developed neck pain + LUE pain/numbness, cannot recall any specific precipitating factor. Was referred to spine specialist and received imaging, states he was told he had some nerve irritation. Received an injection with mild transient relief, now back to baseline.  Pt endorses LUE pain, numbness, and tingling into fingers when symptoms are worse. Endorses hx of migraines, states they are about the same since onset of neck pain.  Pt also notes that his blood pressure stays fairly  elevated despite medication, tends to run 160s-180s systolic although fluctuates. Endorses some exertional SOB, no chest pain. States he can tell when his BP is elevated, occasionally feels "woozy" and endorses occasional transient visual changes. Vitals checked in clinic today, see assessment below, advised pt to follow up with his PCP after today's session. Other red flag questioning reassuring.   SUBJECTIVE STATEMENT: 08/08/2022 Pt states BP did better after he left last session, he started the new dose of BP meds yesterday, and seems to be doing better. 2/10 pain at present, no UE symptoms Hand dominance: Right  PERTINENT HISTORY:  Hx cancer (s/p bowel resection, follows with oncology), migraines, HTN, GERD, GAD  PAIN:  Are you having pain: 2/10 Location/description: aching   Per eval -  Best-worst over past week: 5-8/10  - aggravating factors: sitting, lying on his side, cervical movement - Easing factors: changing positions, injection, heating pad     PRECAUTIONS: hx cancer, HTN (not well controlled per pt, tends to run 160s-180s systolic)  WEIGHT BEARING RESTRICTIONS: No  FALLS:  Has patient fallen in last 6 months? 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, Holiday representative  PLOF: Independent  PATIENT GOALS: reduce pain  NEXT MD VISIT: TBD  OBJECTIVE: (objective measures completed at initial evaluation unless otherwise dated)   DIAGNOSTIC FINDINGS:  MRI per paper referral shows cervical spondylosis and foraminal stenosis C5-7  PATIENT SURVEYS:  FOTO 46 current, 59 predicted  COGNITION: Overall cognitive status: Within functional limits for tasks assessed  SENSATION/NEURO: Light touch intact BUE mildly diminished L C4, C7 Finger<>nose testing unremarkable No clonus BIL Negative hoffmann and tromner sign  No ataxia with gait   POSTURE: forward head, rigid,  guarded posture   PALPATION: Significant symptom irritability with palpation of L LS, UT, rhomboid, deltoid. No midline tenderness or gross deformity noted   CERVICAL ROM:   Active ROM A/PROM (deg) eval  Flexion 25% *  Extension 25% *  Right lateral flexion 50% pain  Left lateral flexion 50% pain   Right rotation 32 deg *  Left rotation 36 deg **    (Blank rows = not tested) (Key: WFL = within functional limits not formally assessed, * = concordant pain, s = stiffness/stretching sensation, NT = not tested)   UPPER EXTREMITY ROM:  A/PROM Right eval Left eval  Shoulder flexion    Shoulder abduction    Shoulder internal rotation    Shoulder external rotation    Elbow flexion    Elbow extension    Wrist flexion    Wrist extension     (Blank rows = not tested) (Key: WFL = within functional limits not formally assessed, * = concordant pain, s = stiffness/stretching sensation, NT = not  tested)  Comments: deferred given symptom irritability  UPPER EXTREMITY MMT:  MMT Right eval Left eval  Shoulder flexion    Shoulder extension    Shoulder abduction    Shoulder extension    Shoulder internal rotation    Shoulder external rotation    Elbow flexion    Elbow extension    Grip strength    (Blank rows = not tested)  (Key: WFL = within functional limits not formally assessed, * = concordant pain, s = stiffness/stretching sensation, NT = not tested)  Comments: deferred given symptom irritability  CERVICAL SPECIAL TESTS:  Deferred given symptom irritability   FUNCTIONAL TESTS:  Limited/painful overhead reach LUE   Vitals: HR 97-100, SpO2 98% RA, BP 146/106 No adverse symptoms endorsed during evaluation  TODAY'S TREATMENT:                                                                                                                              [facility] Adult PT Treatment:                                                DATE: 08/08/22 Vitals: pre session HR 85-88bpm, SpO2  97-98, 162/104  mid session: 88bpm, 98% SpO2, BP 159/108 End of session: HR 87bpm SpO2 98% RA, BP 160/105 Therapeutic Exercise: Scapular retraction 2x10 cues for posture L LS stretch 3x30sec w/ emphasis on breath control and comfortable ROM Median nerve glide LUE x10 cues for setup and sequencing  B ER + scapular retraction 2x8 cues for form and posture  Seated chin tuck 2x8 cues for comfortable ROM and posture  HEP update + education   [facility] Adult PT Treatment:                                                DATE: 08/03/22 Vitals 173/114 pre session HR 95-97bpm SpO2 97% Mid session BP BP 165/120 HR 95-98bpm SpO2 98% Post session BP 169/114, HR/SpO2 stable Neuromuscular re-ed: Significant time spent w/ pain neuroscience education as it relates to life stressors, relevant anatomy/physiology, symptom behavior, relaxation strategies Diaphragmatic paced breathing 3 x 5  breaths with 2 min rest between  Therapeutic Activity: Monitoring vitals, significant time spent discussing pt symptoms, communication w/ provider, relevant anatomy/physiology   [facility] Adult PT Treatment:                                                DATE: 07/26/22 Vitals: HR 95-98 SpO2 100% BP 161-165/105-110 (monitored throughout) Therapeutic Exercise: Seated scapular retraction 2x10 cues for breath control  Posterior shoulder rolls 2x8 cues for pacing and breath control  Median nerve glides BIL 2x10 cues for appropriate ROM, extensive education on rationale and appropriate performance for symptom modification HEP update + education   PATIENT EDUCATION:  Education details: rationale for interventions, pain neuroscience education, monitoring vitals/symptoms and follow up with provider Person educated: Patient Education method: Explanation, Demonstration, Tactile cues, Verbal cues, and Handouts Education comprehension: verbalized understanding, returned demonstration, verbal cues required, tactile cues required, and needs  further education    HOME EXERCISE PROGRAM: Access Code: [email or web address] URL: [email or web address] Date: 08/08/2022 Prepared by: Fransisco Hertz  Exercises - Median Nerve Flossing  - 1-3 x daily - 7 x weekly - 1 sets - 5 reps - Seated Scapular Retraction  - 1-3 x daily - 7 x weekly - 1 sets - 8 reps - Shoulder External Rotation and Scapular Retraction  - 1-3 x daily - 7 x weekly - 1 sets - 8 reps  ASSESSMENT:  CLINICAL IMPRESSION: 08/08/2022 Pt arrives w/ 2/10 pain on NPS, reports change in BP medication dosage yesterday, overall feeling pretty good. No issues since last session. Today continuing to defer resisted/painful exercise as pt remains hypertensive, although stable and improved compared to last session. Is able to tolerate progression for volume today with postural work, reports gradual improvement in symptoms as session goes on. No adverse events, vitals stable on departure, 1/10 pain reported and no UE symptoms. Recommend continuing along current POC in order to address relevant deficits and improve functional tolerance. Pending BP trajectory with reported change in medication dosage, will aim to incorporate more resisted work as able/appropriate. Pt departs today's session in no acute distress, all voiced questions/concerns addressed appropriately from PT perspective.    Per eval - Patient is a pleasant 52 y.o. gentleman who was seen today for physical therapy evaluation and treatment for cervical radiculopathy that has been going on for several months. Pt endorses reduction in activity and significant difficulty with typical daily mobility/activity due to pain. Today's examination is limited by symptom irritability, although he demonstrates global limitations in cervical ROM and concordant TTP of L periscapular/cervical musculature. Cervical mobility and palpation also elicits LUE N/T. Deferred further examination/HEP given symptom irritability. Recommend skilled PT to address  relevant deficits to maximize functional independence and reduce pain levels. During red flag questioning pt does endorse consistently high BP despite medication and symptoms (see subjective above). No symptoms in clinic today, vitals as above. Education on pt on monitoring BP at home, reaching out to PCP to discuss his consistently high BP and the symptoms described in subjective portion after today's session, he verbalizes agreement/understanding. No adverse events. Pt departs today's session in no acute distress, all voiced questions/concerns addressed appropriately from PT perspective.    OBJECTIVE IMPAIRMENTS: decreased activity tolerance, decreased endurance, decreased mobility, decreased ROM, decreased strength, hypomobility, increased muscle spasms, impaired UE functional use, postural dysfunction, and pain.   ACTIVITY LIMITATIONS: carrying, lifting, bending, sitting, standing, sleeping, reach over head, and hygiene/grooming  PARTICIPATION LIMITATIONS: meal prep, cleaning, laundry, driving, community activity, and occupation  PERSONAL FACTORS: Time since onset of injury/illness/exacerbation and 3+ comorbidities: HTN, migraines  are also affecting patient's functional outcome.   REHAB POTENTIAL: Fair given severity of symptoms and comorbidities  CLINICAL DECISION MAKING: Evolving/moderate complexity  EVALUATION COMPLEXITY: Moderate   GOALS: Goals reviewed with patient? No  SHORT TERM GOALS: Target date: 08/01/2022 Pt will demonstrate appropriate understanding and performance of initially prescribed HEP in order to facilitate improved independence with management of  symptoms.  Baseline: HEP TBD 08/08/22: endorses good adherence w/ HEP Goal status: MET  2. Pt will score greater than or equal to 51 on FOTO in order to demonstrate improved perception of function due to symptoms.  Baseline: 46  08/08/22: deferred, only visit 4  Goal status: ONGOING  LONG TERM GOALS: Target date:  08/22/2022 Pt will score 59 on FOTO in order to demonstrate improved perception of function due to symptoms. Baseline: 46 Goal status: INITIAL  2. Pt will demonstrate at least 50 degrees of active cervical rotation ROM in order to demonstrate improved environmental awareness and safety with driving.  Baseline: see ROM chart above Goal status: INITIAL  3. Pt will report at least 50% decrease in overall pain levels in past week in order to facilitate improved tolerance to basic ADLs/mobility.   Baseline: 4-8/10  Goal status: INITIAL    4. Pt will demonstrate appropriate performance of final prescribed HEP in order to facilitate improved self-management of symptoms post-discharge.   Baseline: HEP TBD  Goal status: INITIAL     PLAN:  PT FREQUENCY: 2x/week  PT DURATION: 6 weeks  PLANNED INTERVENTIONS: Therapeutic exercises, Therapeutic activity, Neuromuscular re-education, Balance training, Gait training, Patient/Family education, Self Care, Joint mobilization, Stair training, Vestibular training, Aquatic Therapy, Dry Needling, Spinal mobilization, Cryotherapy, Moist heat, Taping, Manual therapy, and Re-evaluation  PLAN FOR NEXT SESSION: review/update HEP PRN. Gentle periscapular/cervical mobility, nerve glides as appropriate. Pain modulation strategies as indicated. Monitor vitals      Ashley Murrain PT, DPT 08/08/2022 3:44 PM

## 2022-08-10 ENCOUNTER — Encounter: Payer: Self-pay | Admitting: Physical Therapy

## 2022-08-10 ENCOUNTER — Ambulatory Visit: Payer: No Typology Code available for payment source | Admitting: Physical Therapy

## 2022-08-10 DIAGNOSIS — R293 Abnormal posture: Secondary | ICD-10-CM

## 2022-08-10 DIAGNOSIS — M25512 Pain in left shoulder: Secondary | ICD-10-CM

## 2022-08-10 DIAGNOSIS — M542 Cervicalgia: Secondary | ICD-10-CM | POA: Diagnosis not present

## 2022-08-10 NOTE — Therapy (Addendum)
OUTPATIENT PHYSICAL THERAPY TREATMENT NOTE + NO VISIT DISCHARGE SUMMARY (see below)    Patient Name: Leslie Mcmillan MRN: [id number] DOB:[birth year], 52 y.o., male Today's Date: 08/10/2022  END OF SESSION:  PT End of Session - 08/10/22 1416     Visit Number 5    Number of Visits 13    Date for PT Re-Evaluation 08/22/22    Authorization Type [facility]    Authorization Time Period 04/27/2022 -8/24/204    Authorization - Visit Number 5    Authorization - Number of Visits 15    PT Start Time 1417    PT Stop Time 1458    PT Time Calculation (min) 41 min    Activity Tolerance Patient tolerated treatment well    Behavior During Therapy [facility] for tasks assessed/performed                 Past Medical History:  Diagnosis Date   Anxiety    Blood in stool 5--31-2015   Depression    Diverticulitis    Gastroduodenitis    GERD (gastroesophageal reflux disease)    no meds for   Headache    Hypertension    Kidney stones    Past Surgical History:  Procedure Laterality Date   BOWEL RESECTION N/A 08/12/2013   Procedure: SMALL BOWEL ILEAL RESECTION;  Surgeon: Ardeth Sportsman, MD;  Location: [facility];  Service: General;  Laterality: N/A;   COLONOSCOPY N/A 05/09/2013   Procedure: COLONOSCOPY;  Surgeon: Hilarie Fredrickson, MD;  Location: [facility];  Service: Endoscopy;  Laterality: N/A;   ESOPHAGOGASTRODUODENOSCOPY N/A 05/09/2013   Procedure: ESOPHAGOGASTRODUODENOSCOPY (EGD);  Surgeon: Hilarie Fredrickson, MD;  Location: [facility];  Service: Endoscopy;  Laterality: N/A;  possible egd depending on colon results   LAPAROSCOPIC APPENDECTOMY N/A 08/12/2013   Procedure: LAPAROSCOPIC ASSISTED APPENDECTOMY;  Surgeon: Ardeth Sportsman, MD;  Location: [facility];  Service: General;  Laterality: N/A;   TONSILLECTOMY  as child   Patient Active Problem List   Diagnosis Date Noted   Mild episode of recurrent major depressive disorder (HCC) 11/02/2020   GAD (generalized anxiety disorder) 11/02/2020   Fatty liver 11/02/2020    Alcohol abuse 11/02/2020   Cocaine abuse (HCC) 11/02/2020   Obesity (BMI 30-39.9) 03/16/2016   Dog bite of right hand 09/09/2015   GERD (gastroesophageal reflux disease) 09/09/2015   Hyperlipidemia 03/12/2015   Knee pain, bilateral 08/13/2014   Physical exam 07/14/2014   Bilateral carpal tunnel syndrome 07/13/2014   Diarrhea 03/26/2014   Dental abscess 12/22/2013   Allergic rhinitis 11/20/2013   HTN (hypertension) 11/20/2013   Insomnia 11/13/2013   Migraine headache without aura 11/13/2013   Condyloma acuminatum of penis 08/12/2013   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 Has had back issues for some time which pt states has resulted in decline in activity, no longer working. Pt states he then developed neck pain + LUE pain/numbness, cannot recall any specific precipitating factor. Was referred to spine specialist and received imaging, states he was told he had some nerve irritation. Received an injection with mild transient relief, now back to baseline.  Pt endorses LUE pain, numbness, and tingling into fingers when symptoms are worse. Endorses hx of migraines, states they are about the same since onset of neck pain.  Pt also notes that his  blood pressure stays fairly elevated despite medication, tends to run 160s-180s systolic although fluctuates. Endorses some exertional SOB, no chest pain. States he can tell when his BP is elevated, occasionally feels "woozy" and endorses occasional transient visual changes. Vitals checked in clinic today, see assessment below, advised pt to follow up with his PCP after today's session. Other red flag questioning reassuring.   SUBJECTIVE STATEMENT: 08/10/2022 Pt states he felt okay after last session, no significant changes. 2/10 at present and endorses some LUE tingling. States he took his BP medication just before coming, has been trying to take in the mornings.  Hand dominance: Right  PERTINENT HISTORY:  Hx cancer (s/p bowel resection, follows with oncology), migraines, HTN, GERD, GAD  PAIN:  Are you having pain: 2/10 Location/description: aching   Per eval -  Best-worst over past week: 5-8/10  - aggravating factors: sitting, lying on his side, cervical movement - Easing factors: changing positions, injection, heating pad     PRECAUTIONS: hx cancer, HTN (not well controlled per pt, tends to run 160s-180s systolic)  WEIGHT BEARING RESTRICTIONS: No  FALLS:  Has patient fallen in last 6 months? 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, Holiday representative  PLOF: Independent  PATIENT GOALS: reduce pain  NEXT MD VISIT: TBD  OBJECTIVE: (objective measures completed at initial evaluation unless otherwise dated)   DIAGNOSTIC FINDINGS:  MRI per paper referral shows cervical spondylosis and foraminal stenosis C5-7  PATIENT SURVEYS:  FOTO 46 current, 59 predicted  COGNITION: Overall cognitive status: Within functional limits for tasks assessed  SENSATION/NEURO: Light touch intact BUE mildly diminished L C4, C7 Finger<>nose testing unremarkable No clonus BIL Negative hoffmann and  tromner sign  No ataxia with gait   POSTURE: forward head, rigid, guarded posture   PALPATION: Significant symptom irritability with palpation of L LS, UT, rhomboid, deltoid. No midline tenderness or gross deformity noted   CERVICAL ROM:   Active ROM A/PROM (deg) eval AROM 08/10/22  Flexion 25% * ~75% (functional observation)  Extension 25% * ~50% (functional observation)   Right lateral flexion 50% pain   Left lateral flexion 50% pain    Right rotation 32 deg *   Left rotation 36 deg **      (Blank rows = not tested) (Key: WFL = within functional limits not formally assessed, * = concordant pain, s = stiffness/stretching sensation, NT = not tested)   UPPER EXTREMITY ROM:  A/PROM Right eval Left eval  Shoulder flexion    Shoulder abduction    Shoulder internal rotation    Shoulder external rotation    Elbow flexion    Elbow extension    Wrist flexion    Wrist extension     (Blank rows = not tested) (Key:  WFL = within functional limits not formally assessed, * = concordant pain, s = stiffness/stretching sensation, NT = not tested)  Comments: deferred given symptom irritability  UPPER EXTREMITY MMT:  MMT Right eval Left eval  Shoulder flexion    Shoulder extension    Shoulder abduction    Shoulder extension    Shoulder internal rotation    Shoulder external rotation    Elbow flexion    Elbow extension    Grip strength    (Blank rows = not tested)  (Key: WFL = within functional limits not formally assessed, * = concordant pain, s = stiffness/stretching sensation, NT = not tested)  Comments: deferred given symptom irritability  CERVICAL SPECIAL TESTS:  Deferred given symptom irritability   FUNCTIONAL TESTS:  Limited/painful overhead reach LUE   Vitals: HR 97-100, SpO2 98% RA, BP 146/106 No adverse symptoms endorsed during evaluation  TODAY'S TREATMENT:                                                                                                                               [facility] Adult PT Treatment:                                                DATE: 08/10/22 Vitals: Pre session - HR 88bpm SpO2 97% BP 165/108 Mid session - HR low 90s SpO2 96-97% BP 159/115 Post session - HR 86bpm SpO2 97% BP 165/105 Therapeutic Exercise: B ER + scapular retraction 3x8 cues for form and reduced UT compensations, unweighted  Standing horizontal abduction unweighted with foam roller for posture, 2x6 Scapular retraction x12 cues for comfortable ROM Seated GH abduction, short lever unweighted, 2x8 cues for comfortable ROM and pacing  HEP review + education  Neuromuscular re-ed: Paced diaphragmatic breathing ~ (4 breath bouts, 30-60sec normal breathing between) with cues for appropriate pacing, reduced UT activation Discussion/education re: pain neuroscience education as it relates to functional mobility, symptom behavior, and activity tolerance. Education on breathing breaks throughout day, relaxation strategies, etc, as pt endorses significant improvement in symptoms at end of session w/ above breathing    [facility] Adult PT Treatment:                                                DATE: 08/08/22 Vitals: pre session HR 85-88bpm, SpO2 97-98, 162/104  mid session: 88bpm, 98% SpO2, BP 159/108 End of session: HR 87bpm SpO2 98% RA, BP 160/105 Therapeutic Exercise: Scapular retraction 2x10 cues for posture L LS stretch 3x30sec w/ emphasis on breath control and comfortable ROM Median nerve glide LUE x10 cues for setup and sequencing  B ER + scapular retraction 2x8 cues for form and posture  Seated chin tuck 2x8 cues for comfortable ROM and posture  HEP update + education   [facility] Adult PT Treatment:                                                DATE: 08/03/22 Vitals 173/114 pre session HR 95-97bpm SpO2 97% Mid session BP BP 165/120 HR 95-98bpm SpO2 98% Post session BP 169/114, HR/SpO2 stable Neuromuscular re-ed: Significant time spent w/ pain neuroscience education  as it relates to life stressors, relevant anatomy/physiology, symptom behavior, relaxation strategies Diaphragmatic paced breathing 3 x 5  breaths with 2 min rest between  Therapeutic Activity: Monitoring vitals, significant time spent discussing pt symptoms, communication w/ provider, relevant anatomy/physiology   [facility] Adult PT Treatment:                                                DATE: 07/26/22 Vitals: HR 95-98 SpO2 100% BP 161-165/105-110 (monitored throughout) Therapeutic Exercise: Seated scapular retraction 2x10 cues for breath control  Posterior shoulder rolls 2x8 cues for pacing and breath control  Median nerve glides BIL 2x10 cues for appropriate ROM, extensive education on rationale and appropriate performance for symptom modification HEP update + education   PATIENT EDUCATION:  Education details: rationale for interventions, pain neuroscience education, monitoring vitals/symptoms and communication with provider as appropriate Person educated: Patient Education method: Explanation, Demonstration, Tactile cues, Verbal cues Education comprehension: verbalized understanding, returned demonstration, verbal cues required, tactile cues required, and needs further education    HOME EXERCISE PROGRAM: Access Code: [email or web address] URL: [email or web address] Date: 08/08/2022 Prepared by: Fransisco Hertz  Exercises - Median Nerve Flossing  - 1-3 x daily - 7 x weekly - 1 sets - 5 reps - Seated Scapular Retraction  - 1-3 x daily - 7 x weekly - 1 sets - 8 reps - Shoulder External Rotation and Scapular Retraction  - 1-3 x daily - 7 x weekly - 1 sets - 8 reps  ASSESSMENT:  CLINICAL IMPRESSION: 08/10/2022 Pt arrives w/ 2/10 pain on NPS, states he is feeling a bit stressed today but just woke up. No issues since last session. Pt remains hypertensive (states he took BP meds just before coming to session, education is provided on strategies to improve adherence with taking as prescribed  by MD in order to improve activity tolerance), subsequently continuing to defer resistance training. Pt does well with progression for unresisted postural exercises today working on extension and GH mobility within comfortable range. Mild irritation of anterior L GH symptoms with scapular retractions, although pt reports excellent improvement with diaphragmatic breathing at end of session. Education is provided on strategies to encourage mindful/relaxing breathing throughout the day as this appears to be a good symptom modification strategy for pt. No adverse events, vitals stable throughout, pt departs with 1/10 pain. Recommend continuing along current POC in order to address relevant deficits and improve functional tolerance. Pt departs today's session in no acute distress, all voiced questions/concerns addressed appropriately from PT perspective.     Per eval - Patient is a pleasant 52 y.o. gentleman who was seen today for physical therapy evaluation and treatment for cervical radiculopathy that has been going on for several months. Pt endorses reduction in activity and significant difficulty  with typical daily mobility/activity due to pain. Today's examination is limited by symptom irritability, although he demonstrates global limitations in cervical ROM and concordant TTP of L periscapular/cervical musculature. Cervical mobility and palpation also elicits LUE N/T. Deferred further examination/HEP given symptom irritability. Recommend skilled PT to address relevant deficits to maximize functional independence and reduce pain levels. During red flag questioning pt does endorse consistently high BP despite medication and symptoms (see subjective above). No symptoms in clinic today, vitals as above. Education on pt on monitoring BP at home, reaching out to PCP to discuss his consistently high BP and the symptoms described in subjective portion after today's session, he verbalizes agreement/understanding. No  adverse events. Pt departs today's session in no acute distress, all voiced questions/concerns addressed appropriately from PT perspective.    OBJECTIVE IMPAIRMENTS: decreased activity tolerance, decreased endurance, decreased mobility, decreased ROM, decreased strength, hypomobility, increased muscle spasms, impaired UE functional use, postural dysfunction, and pain.   ACTIVITY LIMITATIONS: carrying, lifting, bending, sitting, standing, sleeping, reach over head, and hygiene/grooming  PARTICIPATION LIMITATIONS: meal prep, cleaning, laundry, driving, community activity, and occupation  PERSONAL FACTORS: Time since onset of injury/illness/exacerbation and 3+ comorbidities: HTN, migraines  are also affecting patient's functional outcome.   REHAB POTENTIAL: Fair given severity of symptoms and comorbidities  CLINICAL DECISION MAKING: Evolving/moderate complexity  EVALUATION COMPLEXITY: Moderate   GOALS: Goals reviewed with patient? No  SHORT TERM GOALS: Target date: 08/01/2022 Pt will demonstrate appropriate understanding and performance of initially prescribed HEP in order to facilitate improved independence with management of symptoms.  Baseline: HEP TBD 08/08/22: endorses good adherence w/ HEP Goal status: MET  2. Pt will score greater than or equal to 51 on FOTO in order to demonstrate improved perception of function due to symptoms.  Baseline: 46  08/08/22: deferred, only visit 4  Goal status: ONGOING  LONG TERM GOALS: Target date: 08/22/2022 Pt will score 59 on FOTO in order to demonstrate improved perception of function due to symptoms. Baseline: 46 Goal status: INITIAL  2. Pt will demonstrate at least 50 degrees of active cervical rotation ROM in order to demonstrate improved environmental awareness and safety with driving.  Baseline: see ROM chart above Goal status: INITIAL  3. Pt will report at least 50% decrease in overall pain levels in past week in order to facilitate  improved tolerance to basic ADLs/mobility.   Baseline: 4-8/10  Goal status: INITIAL    4. Pt will demonstrate appropriate performance of final prescribed HEP in order to facilitate improved self-management of symptoms post-discharge.   Baseline: HEP TBD  Goal status: INITIAL     PLAN:  PT FREQUENCY: 2x/week  PT DURATION: 6 weeks  PLANNED INTERVENTIONS: Therapeutic exercises, Therapeutic activity, Neuromuscular re-education, Balance training, Gait training, Patient/Family education, Self Care, Joint mobilization, Stair training, Vestibular training, Aquatic Therapy, Dry Needling, Spinal mobilization, Cryotherapy, Moist heat, Taping, Manual therapy, and Re-evaluation  PLAN FOR NEXT SESSION: review/update HEP PRN. Gentle periscapular/cervical mobility, nerve glides as appropriate. Pain modulation strategies as indicated. Monitor vitals. FOTO next session    Ashley Murrain PT, DPT 08/10/2022 3:16 PM    Discharge addendum:     PHYSICAL THERAPY DISCHARGE SUMMARY  Visits from Start of Care: 5  Current functional level related to goals / functional outcomes: Unable to assess   Remaining deficits: Unable to assess   Education / Equipment: Unable to assess  Patient goals were  unable to be assessed . Patient is being discharged due to not returning since the last  visit.    Ashley Murrain PT, DPT 10/23/2022 8:32 AM

## 2022-08-15 ENCOUNTER — Encounter: Payer: Self-pay | Admitting: Physical Therapy

## 2022-08-15 ENCOUNTER — Ambulatory Visit: Payer: No Typology Code available for payment source | Admitting: Physical Therapy

## 2022-08-17 ENCOUNTER — Ambulatory Visit: Payer: No Typology Code available for payment source | Admitting: Physical Therapy

## 2022-08-17 ENCOUNTER — Encounter: Payer: Self-pay | Admitting: Physical Therapy

## 2022-08-22 ENCOUNTER — Ambulatory Visit: Payer: No Typology Code available for payment source | Admitting: Physical Therapy

## 2022-08-28 ENCOUNTER — Telehealth: Payer: Self-pay | Admitting: Physical Therapy

## 2022-08-28 ENCOUNTER — Encounter: Payer: Self-pay | Admitting: Physical Therapy

## 2022-08-28 NOTE — Telephone Encounter (Signed)
Attempt to call pt for updates re: tomorrow's scheduled appt - unable to reach with number on file, unable to leave voicemail

## 2022-08-29 ENCOUNTER — Ambulatory Visit: Payer: No Typology Code available for payment source | Admitting: Physical Therapy

## 2022-08-29 ENCOUNTER — Telehealth: Payer: Self-pay | Admitting: Physical Therapy

## 2022-08-29 NOTE — Telephone Encounter (Signed)
Returned pt call - he states that appt with PCP went well this morning, they would like to continue to hold therapy for another week (sees them again next Monday) but his blood pressure has been improved (says he has been running in the 70's diastolic). States neck/UE doing well. Will cancel today's appt and schedule for next Tuesday, July 2nd at 3:45pm. Pt verbalizes agreement/understanding with plan, states he will reach out if anything changes.

## 2022-09-05 ENCOUNTER — Telehealth: Payer: Self-pay | Admitting: Physical Therapy

## 2022-09-05 ENCOUNTER — Ambulatory Visit: Payer: No Typology Code available for payment source | Attending: Neurosurgery | Admitting: Physical Therapy

## 2022-09-05 NOTE — Telephone Encounter (Signed)
Called pt re: this afternoon's missed appt - able to leave voicemail with office call back number, no further appointments are scheduled at this time, encouraged patient to reach out if he would like to schedule or if he has any questions/concerns

## 2023-07-31 ENCOUNTER — Emergency Department (HOSPITAL_BASED_OUTPATIENT_CLINIC_OR_DEPARTMENT_OTHER)

## 2023-07-31 ENCOUNTER — Other Ambulatory Visit: Payer: Self-pay

## 2023-07-31 ENCOUNTER — Emergency Department (HOSPITAL_BASED_OUTPATIENT_CLINIC_OR_DEPARTMENT_OTHER)
Admission: EM | Admit: 2023-07-31 | Discharge: 2023-07-31 | Disposition: A | Discharge status: Home / Self Care | Attending: Emergency Medicine | Admitting: Emergency Medicine

## 2023-07-31 ENCOUNTER — Encounter (HOSPITAL_BASED_OUTPATIENT_CLINIC_OR_DEPARTMENT_OTHER): Payer: Self-pay | Admitting: Urology

## 2023-07-31 DIAGNOSIS — R748 Abnormal levels of other serum enzymes: Secondary | ICD-10-CM | POA: Insufficient documentation

## 2023-07-31 DIAGNOSIS — R112 Nausea with vomiting, unspecified: Secondary | ICD-10-CM | POA: Diagnosis present

## 2023-07-31 DIAGNOSIS — R059 Cough, unspecified: Secondary | ICD-10-CM | POA: Diagnosis not present

## 2023-07-31 DIAGNOSIS — I10 Essential (primary) hypertension: Secondary | ICD-10-CM | POA: Insufficient documentation

## 2023-07-31 DIAGNOSIS — R1084 Generalized abdominal pain: Secondary | ICD-10-CM | POA: Insufficient documentation

## 2023-07-31 DIAGNOSIS — Z79899 Other long term (current) drug therapy: Secondary | ICD-10-CM | POA: Diagnosis not present

## 2023-07-31 LAB — COMPREHENSIVE METABOLIC PANEL WITH GFR
ALT: 24 U/L (ref 0–44)
AST: 20 U/L (ref 15–41)
Albumin: 4.1 g/dL (ref 3.5–5.0)
Alkaline Phosphatase: 81 U/L (ref 38–126)
Anion gap: 10 (ref 5–15)
BUN: 10 mg/dL (ref 6–20)
CO2: 29 mmol/L (ref 22–32)
Calcium: 9.3 mg/dL (ref 8.9–10.3)
Chloride: 106 mmol/L (ref 98–111)
Creatinine, Ser: 1.04 mg/dL (ref 0.61–1.24)
GFR, Estimated: >60 mL/min (ref >60)
Glucose, Bld: 95 mg/dL (ref 70–99)
Potassium: 4.3 mmol/L (ref 3.5–5.1)
Sodium: 145 mmol/L (ref 135–145)
Total Bilirubin: 0.3 mg/dL (ref 0.0–1.2)
Total Protein: 6.8 g/dL (ref 6.5–8.1)

## 2023-07-31 LAB — CBC WITH DIFFERENTIAL/PLATELET
Abs Immature Granulocytes: 0.03 10*3/uL (ref 0.00–0.07)
Basophils Absolute: 0.1 10*3/uL (ref 0.0–0.1)
Basophils Relative: 1 %
Eosinophils Absolute: 0.1 10*3/uL (ref 0.0–0.5)
Eosinophils Relative: 2 %
HCT: 42.9 % (ref 39.0–52.0)
Hemoglobin: 14.4 g/dL (ref 13.0–17.0)
Immature Granulocytes: 0 %
Lymphocytes Relative: 31 %
Lymphs Abs: 2.4 10*3/uL (ref 0.7–4.0)
MCH: 28.7 pg (ref 26.0–34.0)
MCHC: 33.6 g/dL (ref 30.0–36.0)
MCV: 85.5 fL (ref 80.0–100.0)
Monocytes Absolute: 0.8 10*3/uL (ref 0.1–1.0)
Monocytes Relative: 11 %
Neutro Abs: 4.2 10*3/uL (ref 1.7–7.7)
Neutrophils Relative %: 55 %
Platelets: 264 10*3/uL (ref 150–400)
RBC: 5.02 MIL/uL (ref 4.22–5.81)
RDW: 13.3 % (ref 11.5–15.5)
WBC: 7.6 10*3/uL (ref 4.0–10.5)
nRBC: 0 % (ref 0.0–0.2)

## 2023-07-31 LAB — RESP PANEL BY RT-PCR (RSV, FLU A&B, COVID)  RVPGX2
Influenza A by PCR: NEGATIVE
Influenza B by PCR: NEGATIVE
Resp Syncytial Virus by PCR: NEGATIVE
SARS Coronavirus 2 by RT PCR: NEGATIVE

## 2023-07-31 LAB — LIPASE, BLOOD: Lipase: 101 U/L — ABNORMAL HIGH (ref 11–51)

## 2023-07-31 MED ORDER — PANTOPRAZOLE SODIUM 40 MG IV SOLR
40.0000 mg | Freq: Once | INTRAVENOUS | Status: AC
  Administered 2023-07-31: 40 mg via INTRAVENOUS
  Filled 2023-07-31: qty 10

## 2023-07-31 MED ORDER — OMEPRAZOLE 40 MG PO CPDR: 40.0000 mg | DELAYED_RELEASE_CAPSULE | Freq: Every day | ORAL | 1 refills | Status: AC

## 2023-07-31 MED ORDER — FENTANYL CITRATE PF 50 MCG/ML IJ SOSY
50.0000 ug | PREFILLED_SYRINGE | Freq: Once | INTRAMUSCULAR | Status: AC
  Administered 2023-07-31: 50 ug via INTRAVENOUS
  Filled 2023-07-31: qty 1

## 2023-07-31 MED ORDER — ALBUTEROL SULFATE HFA 108 (90 BASE) MCG/ACT IN AERS
2.0000 | INHALATION_SPRAY | Freq: Once | RESPIRATORY_TRACT | Status: AC
  Administered 2023-07-31: 2 via RESPIRATORY_TRACT
  Filled 2023-07-31: qty 6.7

## 2023-07-31 MED ORDER — IOHEXOL 300 MG/ML  SOLN
100.0000 mL | Freq: Once | INTRAMUSCULAR | Status: AC | PRN
  Administered 2023-07-31: 80 mL via INTRAVENOUS

## 2023-07-31 MED ORDER — ONDANSETRON HCL 4 MG/2ML IJ SOLN
4.0000 mg | Freq: Once | INTRAMUSCULAR | Status: AC
  Administered 2023-07-31: 4 mg via INTRAVENOUS
  Filled 2023-07-31: qty 2

## 2023-07-31 MED ORDER — ONDANSETRON HCL 4 MG PO TABS: 4.0000 mg | ORAL_TABLET | Freq: Three times a day (TID) | ORAL | 0 refills | Status: AC | PRN

## 2023-07-31 NOTE — ED Provider Notes (Signed)
 [facility] Provider Note   CSN: [id number] Arrival date & time: 07/31/23  1912     History {Add pertinent medical, surgical, social history, OB history to HPI:1} Chief Complaint  Patient presents with   Abdominal Pain    Leslie Mcmillan is a 53 y.o. male.  Leslie Mcmillan he has felt unwell since yesterday.  Came home from work and had nausea and vomiting, vomit with a little bit of blood in it.  Generalized abdominal pain.  Has also had some cough.  No prior history of blood in vomit.  Does smoke cigars and drink alcohol.  Denies significant NSAID use.  Has tried nothing for his symptoms.  The history is provided by the patient.  Abdominal Pain Pain location:  Generalized Pain quality: aching   Onset quality:  Gradual Duration:  2 days Timing:  Constant Progression:  Unchanged Chronicity:  New Relieved by:  None tried Worsened by:  Nothing Ineffective treatments:  None tried Associated symptoms: cough, hematemesis, shortness of breath and vomiting   Associated symptoms: no chest pain, no diarrhea, no dysuria, no fever, no hematochezia, no hematuria and no melena        Home Medications Prior to Admission medications   Medication Sig Start Date End Date Taking? Authorizing Provider  albuterol  (VENTOLIN  HFA) 108 (90 Base) MCG/ACT inhaler Inhale 1-2 puffs into the lungs every 6 (six) hours as needed for wheezing or shortness of breath. 03/28/22   Lanetta Pion, NP  amitriptyline (ELAVIL) 100 MG tablet Take 100 mg by mouth at bedtime.    [provider]  atorvastatin  (LIPITOR) 20 MG tablet Take 1 tablet (20 mg total) by mouth at bedtime. 03/16/15   Tabori, Katherine E, MD  benzonatate  (TESSALON ) 100 MG capsule Take 1 capsule (100 mg total) by mouth 3 (three) times daily as needed for cough. 03/28/22   Lanetta Pion, NP  brompheniramine-pseudoephedrine-DM 30-2-10 MG/5ML syrup Take 5 mLs by mouth 3 (three) times daily as needed.  03/28/22   Lanetta Pion, NP  colchicine  0.6 MG tablet Take 1 tablet (0.6 mg total) by mouth 2 (two) times daily. 03/07/22   Debbra Fairy, PA-C  dicyclomine  (BENTYL ) 20 MG tablet Take 1 tablet (20 mg total) by mouth 2 (two) times daily. 05/27/21   Henderly, Britni A, PA-C  ibuprofen  (ADVIL ) 600 MG tablet Take 1 tablet (600 mg total) by mouth every 6 (six) hours as needed. 03/07/22   Debbra Fairy, PA-C  losartan  (COZAAR ) 100 MG tablet Take 1 tablet (100 mg total) by mouth daily. 04/25/19   Tonya Fredrickson, MD  omeprazole  (PRILOSEC) 40 MG capsule Take 1 capsule (40 mg total) by mouth daily. 03/16/16   Tabori, Katherine E, MD  ondansetron  (ZOFRAN -ODT) 4 MG disintegrating tablet Take 1 tablet (4 mg total) by mouth every 8 (eight) hours as needed for nausea or vomiting. 05/27/21   Henderly, Britni A, PA-C  pantoprazole  (PROTONIX ) 40 MG tablet Take 1 tablet (40 mg total) by mouth 2 (two) times daily. 04/25/19   Tonya Fredrickson, MD  predniSONE  (DELTASONE ) 20 MG tablet 3 tabs po day one, then 2 tabs daily x 4 days 03/07/22   Debbra Fairy, PA-C  sertraline  (ZOLOFT ) 50 MG tablet Take 1 tablet (50 mg total) by mouth daily. 11/01/20   Mayers, Cari S, PA-C  SUMAtriptan  (IMITREX ) 25 MG tablet Take 25 mg by mouth once. May repeat in 2 hours if headache persists or recurs.    [provider]  traZODone  (DESYREL ) 50 MG tablet Take 0.5-1 tablets (25-50 mg total) by mouth at bedtime as needed for sleep. 11/01/20   Mayers, Cari S, PA-C      Allergies    Patient has no known allergies.    Review of Systems   Review of Systems  Constitutional:  Negative for fever.  Respiratory:  Positive for cough and shortness of breath.   Cardiovascular:  Negative for chest pain.  Gastrointestinal:  Positive for abdominal pain, hematemesis and vomiting. Negative for diarrhea, hematochezia and melena.  Genitourinary:  Negative for dysuria and hematuria.    Physical Exam Updated Vital Signs BP (!) 172/111 (BP Location: Left Arm)    Pulse 93   Temp 97.8 F (36.6 C)   Resp 20   Ht 5\' 9"  (1.753 m)   Wt 102.1 kg   SpO2 100%   BMI 33.24 kg/m  Physical Exam Vitals and nursing note reviewed.  Constitutional:      General: He is not in acute distress.    Appearance: Normal appearance. He is well-developed.  HENT:     Head: Normocephalic and atraumatic.  Eyes:     Conjunctiva/sclera: Conjunctivae normal.  Cardiovascular:     Rate and Rhythm: Normal rate and regular rhythm.     Heart sounds: No murmur heard. Pulmonary:     Effort: Pulmonary effort is normal. No respiratory distress.     Breath sounds: Normal breath sounds.  Abdominal:     Palpations: Abdomen is soft.     Tenderness: There is no abdominal tenderness. There is no guarding or rebound.  Musculoskeletal:        General: No deformity.     Cervical back: Neck supple.  Skin:    General: Skin is warm and dry.     Capillary Refill: Capillary refill takes less than 2 seconds.  Neurological:     General: No focal deficit present.     Mental Status: He is alert.     ED Results / Procedures / Treatments   Labs (all labs ordered are listed, but only abnormal results are displayed) Labs Reviewed - No data to display  EKG None  Radiology No results found.  Procedures Procedures  {Document cardiac monitor, telemetry assessment procedure when appropriate:1}  Medications Ordered in ED Medications  pantoprazole  (PROTONIX ) injection 40 mg (has no administration in time range)  fentaNYL  (SUBLIMAZE ) injection 50 mcg (has no administration in time range)  ondansetron  (ZOFRAN ) injection 4 mg (has no administration in time range)    ED Course/ Medical Decision Making/ A&P   {   Click here for ABCD2, HEART and other calculatorsREFRESH Note before signing :1}                              Medical Decision Making Amount and/or Complexity of Data Reviewed Labs: ordered. Radiology: ordered.  Risk Prescription drug management.   This patient  complains of ***; this involves an extensive number of treatment Options and is a complaint that carries with it a high risk of complications and morbidity. The differential includes ***  I ordered, reviewed and interpreted labs, which included *** I ordered medication *** and reviewed PMP when indicated. I ordered imaging studies which included *** and I independently    visualized and interpreted imaging which showed *** Additional history obtained from *** Previous records obtained and reviewed *** I consulted *** and discussed lab and imaging findings and  discussed disposition.  Cardiac monitoring reviewed, *** Social determinants considered, *** Critical Interventions: ***  After the interventions stated above, I reevaluated the patient and found *** Admission and further testing considered, ***   {Document critical care time when appropriate:1} {Document review of labs and clinical decision tools ie heart score, Chads2Vasc2 etc:1}  {Document your independent review of radiology images, and any outside records:1} {Document your discussion with family members, caretakers, and with consultants:1} {Document social determinants of health affecting pt's care:1} {Document your decision making why or why not admission, treatments were needed:1} Final Clinical Impression(s) / ED Diagnoses Final diagnoses:  None    Rx / DC Orders ED Discharge Orders     None

## 2023-07-31 NOTE — ED Notes (Signed)
 Patient transported to CT

## 2023-07-31 NOTE — ED Triage Notes (Signed)
 Pt states emesis that started last night and noticed blood in it  Also states generalized abd pain and soft stools   H/o diverticulitis  HTN noted, did not take BP meds today

## 2023-07-31 NOTE — Discharge Instructions (Addendum)
 You are seen in the emergency department for abdominal pain nausea vomiting.  You had blood work EKG and a CAT scan of your abdomen pelvis that did not show an obvious explanation for your symptoms.  We are prescribing you some acid medication and nausea medication.  Please schedule a follow-up appointment with your primary care doctor.  Take your blood pressure medication regularly.  Return if any worsening or concerning symptoms
